# Patient Record
Sex: Female | Born: 1940 | Smoking: Never smoker
Health system: Southern US, Community
[De-identification: ages and names within clinical notes are randomized; demographics above are authoritative.]

## PROBLEM LIST (undated history)

## (undated) DIAGNOSIS — R51 Headache: Secondary | ICD-10-CM

## (undated) DIAGNOSIS — Z5189 Encounter for other specified aftercare: Secondary | ICD-10-CM

## (undated) DIAGNOSIS — H409 Unspecified glaucoma: Secondary | ICD-10-CM

## (undated) DIAGNOSIS — R0982 Postnasal drip: Secondary | ICD-10-CM

## (undated) DIAGNOSIS — K5900 Constipation, unspecified: Secondary | ICD-10-CM

## (undated) DIAGNOSIS — R519 Headache, unspecified: Secondary | ICD-10-CM

## (undated) DIAGNOSIS — I10 Essential (primary) hypertension: Secondary | ICD-10-CM

## (undated) DIAGNOSIS — D649 Anemia, unspecified: Secondary | ICD-10-CM

## (undated) DIAGNOSIS — J189 Pneumonia, unspecified organism: Secondary | ICD-10-CM

## (undated) DIAGNOSIS — H269 Unspecified cataract: Secondary | ICD-10-CM

## (undated) DIAGNOSIS — E785 Hyperlipidemia, unspecified: Secondary | ICD-10-CM

## (undated) DIAGNOSIS — J449 Chronic obstructive pulmonary disease, unspecified: Secondary | ICD-10-CM

## (undated) DIAGNOSIS — M1990 Unspecified osteoarthritis, unspecified site: Secondary | ICD-10-CM

## (undated) HISTORY — PX: TUBAL LIGATION: SHX77

## (undated) HISTORY — PX: ABDOMINAL HYSTERECTOMY: SHX81

## (undated) HISTORY — DX: Unspecified glaucoma: H40.9

## (undated) HISTORY — DX: Hyperlipidemia, unspecified: E78.5

## (undated) HISTORY — DX: Anemia, unspecified: D64.9

## (undated) HISTORY — DX: Chronic obstructive pulmonary disease, unspecified: J44.9

## (undated) HISTORY — PX: TONSILLECTOMY: SUR1361

## (undated) HISTORY — DX: Encounter for other specified aftercare: Z51.89

## (undated) HISTORY — DX: Unspecified cataract: H26.9

## (undated) HISTORY — DX: Essential (primary) hypertension: I10

## (undated) HISTORY — DX: Headache, unspecified: R51.9

## (undated) HISTORY — DX: Constipation, unspecified: K59.00

## (undated) HISTORY — DX: Unspecified osteoarthritis, unspecified site: M19.90

## (undated) HISTORY — DX: Headache: R51

## (undated) HISTORY — DX: Postnasal drip: R09.82

## (undated) HISTORY — DX: Pneumonia, unspecified organism: J18.9

---

## 2016-12-05 ENCOUNTER — Ambulatory Visit (INDEPENDENT_AMBULATORY_CARE_PROVIDER_SITE_OTHER): Payer: Medicare Other | Admitting: Family Medicine

## 2016-12-05 ENCOUNTER — Encounter: Payer: Self-pay | Admitting: Family Medicine

## 2016-12-05 ENCOUNTER — Encounter (INDEPENDENT_AMBULATORY_CARE_PROVIDER_SITE_OTHER): Payer: Self-pay

## 2016-12-05 VITALS — BP 153/81 | HR 90 | Temp 97.7°F | Ht 63.0 in | Wt 124.4 lb

## 2016-12-05 DIAGNOSIS — I10 Essential (primary) hypertension: Secondary | ICD-10-CM

## 2016-12-05 MED ORDER — AMLODIPINE BESYLATE 5 MG PO TABS: 5.0000 mg | ORAL_TABLET | Freq: Every day | ORAL | 3 refills | Status: DC

## 2016-12-05 NOTE — Progress Notes (Signed)
Subjective:  Patient ID: Lynn Powell, female    DOB: 19-Oct-1941  Age: 76 y.o. MRN: [id number]  CC: Elevated BP  HPI Lynn Powell is a 76 y.o. female presents to the clinic today for evaluation of the above. Of note, Patient resides predominantly in [place]. This is where the majority of her medical care is. She is down here with her daughters. She is returning in May. Patient states that all of her preventative health care is done in [place].  Elevated BP  Patient reports that she her blood pressure has been elevated as of late.  She recently saw a local urgent care for sinusitis and her blood pressure was in the 170s systolic.  Patient endorses that she has had high blood pressure in the past which was treated previously. This was years ago.  She reports associated headaches.  No chest pain or shortness of breath.  She attributes her elevated blood pressure during stress.  No known relieving factors.  No other associated symptoms. No other complaints or concerns at this time.  PMH, Surgical Hx, Family Hx, Social History reviewed and updated as below.  Past Medical History:  Diagnosis Date  . Frequent headaches   . Glaucoma   . Hypertension    Past Surgical History:  Procedure Laterality Date  . ABDOMINAL HYSTERECTOMY    . TONSILLECTOMY     Family History  Problem Relation Age of Onset  . Heart disease Mother   . Stroke Father    Social History  Substance Use Topics  . Smoking status: Never Smoker  . Smokeless tobacco: Never Used  . Alcohol use No   Review of Systems  Neurological: Positive for headaches.  Psychiatric/Behavioral:       Stress.  All other systems reviewed and are negative.  Objective:   Today's Vitals: BP (!) 153/81   Pulse 90   Temp 97.7 F (36.5 C) (Oral)   Ht 5\' 3"  (1.6 m)   Wt 124 lb 6.4 oz (56.4 kg)   SpO2 100%   BMI 22.04 kg/m   Physical Exam  Constitutional: She is oriented to person, place, and time. She  appears well-developed and well-nourished. No distress.  HENT:  Head: Normocephalic and atraumatic.  Nose: Nose normal.  Mouth/Throat: Oropharynx is clear and moist. No oropharyngeal exudate.  Normal TM's bilaterally.   Eyes: Conjunctivae are normal. No scleral icterus.  Neck: Neck supple.  Cardiovascular: Normal rate and regular rhythm.   No murmur heard. Pulmonary/Chest: Effort normal and breath sounds normal. She has no wheezes. She has no rales.  Abdominal: Soft. She exhibits no distension. There is no tenderness. There is no rebound and no guarding.  Musculoskeletal: Normal range of motion. She exhibits no edema.  Lymphadenopathy:    She has no cervical adenopathy.  Neurological: She is alert and oriented to person, place, and time.  Skin: Skin is warm and dry. No rash noted.  Psychiatric: She has a normal mood and affect.  Vitals reviewed.  Assessment & Plan:   Problem List Items Addressed This Visit    Essential hypertension - Primary    New problem (to me). Treating with Norvasc. Follow up in 1 month for BP check.      Relevant Medications   amLODipine (NORVASC) 5 MG tablet     Outpatient Encounter Prescriptions as of 12/05/2016  Medication Sig  . progesterone (PROMETRIUM) 200 MG capsule TAKE ONE CAPSULE BY MOUTH DAILY AT BEDTIME  . amLODipine (NORVASC) 5 MG tablet Take 1  tablet (5 mg total) by mouth daily.   No facility-administered encounter medications on file as of 12/05/2016.     Follow-up: Return in about 1 month (around 01/02/2017) for BP check - Nurse visit.  Everlene Other DO [facility]

## 2016-12-05 NOTE — Patient Instructions (Signed)
Take the blood pressure medication daily.  You will need labs in the near future if you stay in [place].  Follow up in 1 month for a BP check.  Take care  Dr. Adriana Simasook

## 2016-12-05 NOTE — Progress Notes (Signed)
Pre visit review using our clinic review tool, if applicable. No additional management support is needed unless otherwise documented below in the visit note. 

## 2016-12-05 NOTE — Assessment & Plan Note (Signed)
New problem (to me). Treating with Norvasc. Follow up in 1 month for BP check.

## 2017-01-07 ENCOUNTER — Ambulatory Visit (INDEPENDENT_AMBULATORY_CARE_PROVIDER_SITE_OTHER): Payer: Medicare Other | Admitting: *Deleted

## 2017-01-07 ENCOUNTER — Encounter: Payer: Self-pay | Admitting: *Deleted

## 2017-01-07 VITALS — BP 124/64 | HR 88 | Resp 10

## 2017-01-07 DIAGNOSIS — I10 Essential (primary) hypertension: Secondary | ICD-10-CM | POA: Diagnosis not present

## 2017-01-07 NOTE — Progress Notes (Signed)
BP at goal.  Continue medication.  Care was provided under my supervision. I agree with the management as indicated in the note.  Everlene OtherJayce Amareon Phung DO

## 2017-01-07 NOTE — Progress Notes (Signed)
Patient presented for one month BP check, patient was started on Amlodipine 5 mg by PCP on 12/05/2016 for BP 153/90 pulse 90. Patient was allowed to rest for 5 -8 minutes BP attained in left arm 120/64 pulse 88, patient was allowed to rest an additional 5- 8 minutes BP attained in right arm 120/64 pulse 87.

## 2017-01-07 NOTE — Progress Notes (Signed)
Patient notified to continue Amlodipine.

## 2017-07-01 ENCOUNTER — Encounter: Payer: Self-pay | Admitting: Family Medicine

## 2017-07-01 ENCOUNTER — Ambulatory Visit (INDEPENDENT_AMBULATORY_CARE_PROVIDER_SITE_OTHER): Payer: Medicare Other | Admitting: Family Medicine

## 2017-07-01 DIAGNOSIS — L989 Disorder of the skin and subcutaneous tissue, unspecified: Secondary | ICD-10-CM | POA: Diagnosis not present

## 2017-07-01 NOTE — Patient Instructions (Signed)
Dermatologist: Dr. Gwen [facility]oundsKowalski [facility]).  I highly doubt you're diabetic.  Take care  Dr. Adriana Simasook

## 2017-07-01 NOTE — Assessment & Plan Note (Signed)
New problem. Uncertain prognosis at this time. Concern for actinic keratosis versus squamous cell carcinoma. I favor the former. Patient has had lots of sun exposure and has had skin lesions removed in the past. I informed her that this is not a diabetic wound and then I sincerely doubt she has diabetes. She states that she's had labs by a physician in [place] approximately one year ago and they were normal (normal A1C). I advised that she should see dermatology. I gave her the name of a local dermatologist. She will look him up and consider a referral.

## 2017-07-01 NOTE — Progress Notes (Signed)
   Subjective:  Patient ID: Lynn Powell, female    DOB: 12/13/1940  Age: 76 y.o. MRN: [id number]  CC: Skin lesion  HPI:  76 year old female presents with the above complaint.  Patient reports that she's had a sore/skin lesion on her posterior left calf the past 6 months. She states that it's dry and irritated. It subsequently scabs over and then the scab falls off. She states that it has not completely healed over the past 6 months. She states that she has made some dietary changes and feels like she's had some improvement. She has used some hydrocortisone with some improvement. No known exacerbating factors. She is concerned that she has diabetes and this is what is leading to poor wound healing. No drainage from the wound. No surrounding erythema. No reports of fluctuance. No reports of color change or increase in size. No other associated symptoms. No other complaints or concerns at this time.  Social Hx   Social History   Social History  . Marital status: Widowed    Spouse name: N/A  . Number of children: N/A  . Years of education: N/A   Social History Main Topics  . Smoking status: Never Smoker  . Smokeless tobacco: Never Used  . Alcohol use No  . Drug use: No  . Sexual activity: No   Other Topics Concern  . None   Social History Narrative  . None    Review of Systems  Constitutional: Negative.   Skin:       Skin lesion.   Objective:  BP 110/64 (BP Location: Left Arm, Patient Position: Sitting, Cuff Size: Normal)   Pulse 84   Temp 98.7 F (37.1 C) (Oral)   Resp 16   Wt 122 lb (55.3 kg)   SpO2 97%   BMI 21.61 kg/m   BP/Weight 07/01/2017 01/07/2017 12/05/2016  Systolic BP 110 124 153  Diastolic BP 64 64 81  Wt. (Lbs) 122 - 124.4  BMI 21.61 - 22.04    Physical Exam  Constitutional: She is oriented to person, place, and time. She appears well-developed. No distress.  HENT:  Head: Normocephalic and atraumatic.  Pulmonary/Chest: Effort normal. No respiratory  distress.  Neurological: She is alert and oriented to person, place, and time.  Skin:  Left calf - small area of mild erythema and dryness. No drainage. No fluctuance.  Psychiatric: She has a normal mood and affect.  Vitals reviewed.  Assessment & Plan:   Problem List Items Addressed This Visit    Skin lesion    New problem. Uncertain prognosis at this time. Concern for actinic keratosis versus squamous cell carcinoma. I favor the former. Patient has had lots of sun exposure and has had skin lesions removed in the past. I informed her that this is not a diabetic wound and then I sincerely doubt she has diabetes. She states that she's had labs by a physician in [place] approximately one year ago and they were normal (normal A1C). I advised that she should see dermatology. I gave her the name of a local dermatologist. She will look him up and consider a referral.        Follow-up: PRN  Everlene OtherJayce Alka Falwell DO [facility]

## 2017-10-22 ENCOUNTER — Ambulatory Visit (INDEPENDENT_AMBULATORY_CARE_PROVIDER_SITE_OTHER): Payer: Medicare Other | Admitting: Family Medicine

## 2017-10-22 ENCOUNTER — Encounter: Payer: Self-pay | Admitting: Family Medicine

## 2017-10-22 VITALS — BP 116/70 | HR 73 | Temp 97.9°F | Wt 121.0 lb

## 2017-10-22 DIAGNOSIS — J309 Allergic rhinitis, unspecified: Secondary | ICD-10-CM

## 2017-10-22 NOTE — Patient Instructions (Signed)
Please try some ceterizine (generic Zyrtec). Take in the evening for 5-7 days, then daily as needed.

## 2017-10-22 NOTE — Progress Notes (Signed)
   Subjective:    Patient ID: Carolynn CommentCheryl Laver, female    DOB: 1941/07/30, 76 y.o.   MRN: [id number]  HPI This is a 76 yo female who presents today with clear nasal drainage x 2 months. Started with a cold. Seemed to last for a long time and head congestion has lingered. No color to drainage and no sinus pain. Taking tylenol PM to help with sleep, some relief, but makes her groggy. A lot of post nasal drainage. No cough or chest congestion. Occasional headache, relieved with aspirin or tylenol or sleep. Has been serum tested for allergies in past and had tomato plant allergy. Other allergies unknown. Does have cats in the house.   Past Medical History:  Diagnosis Date  . Frequent headaches   . Glaucoma   . Hypertension    Past Surgical History:  Procedure Laterality Date  . ABDOMINAL HYSTERECTOMY    . TONSILLECTOMY     Family History  Problem Relation Age of Onset  . Heart disease Mother   . Stroke Father    Social History   Tobacco Use  . Smoking status: Never Smoker  . Smokeless tobacco: Never Used  Substance Use Topics  . Alcohol use: No  . Drug use: No    .   Review of Systems Per HPI    Objective:   Physical Exam  Constitutional: She is oriented to person, place, and time. She appears well-developed and well-nourished. No distress.  HENT:  Head: Normocephalic and atraumatic.  Right Ear: Tympanic membrane, external ear and ear canal normal.  Left Ear: Tympanic membrane, external ear and ear canal normal.  Nose: Mucosal edema and rhinorrhea present.  Mouth/Throat: Uvula is midline, oropharynx is clear and moist and mucous membranes are normal.  Eyes: Conjunctivae are normal.  Neck: Normal range of motion. Neck supple.  Cardiovascular: Normal rate, regular rhythm and normal heart sounds.  Pulmonary/Chest: Effort normal and breath sounds normal.  Lymphadenopathy:    She has no cervical adenopathy.  Neurological: She is alert and oriented to person, place, and  time.  Skin: Skin is warm and dry. She is not diaphoretic.  Psychiatric: She has a normal mood and affect. Her behavior is normal. Judgment and thought content normal.  Vitals reviewed.     BP 116/70   Pulse 73   Temp 97.9 F (36.6 C) (Oral)   Wt 121 lb (54.9 kg)   SpO2 99%   BMI 21.43 kg/m  Wt Readings from Last 3 Encounters:  10/22/17 121 lb (54.9 kg)  07/01/17 122 lb (55.3 kg)  12/05/16 124 lb 6.4 oz (56.4 kg)       Assessment & Plan:  1. Allergic rhinitis, unspecified seasonality, unspecified trigger - Provided written and verbal information regarding diagnosis and treatment. - Encouraged her to try otc cetirizine - RTC precautions reviewed   Olean Reeeborah Gessner, FNP-BC  [facility], [facility]  10/22/2017 1:12 PM

## 2017-11-15 ENCOUNTER — Other Ambulatory Visit: Payer: Self-pay | Admitting: Family Medicine

## 2017-12-01 ENCOUNTER — Ambulatory Visit (INDEPENDENT_AMBULATORY_CARE_PROVIDER_SITE_OTHER): Payer: Medicare Other | Admitting: Internal Medicine

## 2017-12-01 ENCOUNTER — Encounter: Payer: Self-pay | Admitting: Internal Medicine

## 2017-12-01 VITALS — BP 102/58 | HR 103 | Temp 98.9°F | Ht 63.0 in | Wt 121.8 lb

## 2017-12-01 DIAGNOSIS — I10 Essential (primary) hypertension: Secondary | ICD-10-CM | POA: Diagnosis not present

## 2017-12-01 DIAGNOSIS — Z1329 Encounter for screening for other suspected endocrine disorder: Secondary | ICD-10-CM | POA: Diagnosis not present

## 2017-12-01 DIAGNOSIS — J189 Pneumonia, unspecified organism: Secondary | ICD-10-CM | POA: Diagnosis not present

## 2017-12-01 MED ORDER — DOXYCYCLINE HYCLATE 100 MG PO TABS: 100.0000 mg | ORAL_TABLET | Freq: Two times a day (BID) | ORAL | 0 refills | Status: DC

## 2017-12-01 MED ORDER — AMLODIPINE BESYLATE 5 MG PO TABS: 5.0000 mg | ORAL_TABLET | Freq: Every day | ORAL | 1 refills | Status: DC

## 2017-12-01 NOTE — Progress Notes (Signed)
Chief Complaint  Patient presents with  . Follow-up    PNA  . Hypertension   Follow up  1. Pt reports [facility] with [facility] and COPD but never smoker but exposed to 2nd hand smoke and lived in [facility] [place] with pollution. She reports recent sick contact with grandkids +flu. She was given Zpack which she did not fill then told by [facility] to fill Augmentin which she has not yet filled. Other than slight fatigue feeling ok and she did have a fever to 103.2 and has been coughing. She was also given tessalon perles which she has not filled yet.  2. HTN she needs refill of Norvasc 5 mg qd and reports she is going to [place] Friday x 1 month and will be back early 12/2017.    Review of Systems  Constitutional: Positive for fever and malaise/fatigue. Negative for weight loss.  HENT: Negative for hearing loss.   Respiratory: Positive for cough. Negative for shortness of breath.   Cardiovascular: Negative for chest pain.  Gastrointestinal: Negative for abdominal pain.  Musculoskeletal: Negative for falls.  Skin: Negative for rash.  Neurological: Negative for headaches.  Psychiatric/Behavioral: Negative for memory loss.   Past Medical History:  Diagnosis Date  . Frequent headaches   . Glaucoma   . Hypertension   . Pneumonia    Past Surgical History:  Procedure Laterality Date  . ABDOMINAL HYSTERECTOMY    . TONSILLECTOMY     Family History  Problem Relation Age of Onset  . Heart disease Mother   . Stroke Father    Social History   Socioeconomic History  . Marital status: Widowed    Spouse name: Not on file  . Number of children: Not on file  . Years of education: Not on file  . Highest education level: Not on file  Social Needs  . Financial resource strain: Not on file  . Food insecurity - worry: Not on file  . Food insecurity - inability: Not on file  . Transportation needs - medical: Not on file  . Transportation needs - non-medical: Not on  file  Occupational History  . Not on file  Tobacco Use  . Smoking status: Never Smoker  . Smokeless tobacco: Never Used  Substance and Sexual Activity  . Alcohol use: No  . Drug use: No  . Sexual activity: No    Partners: Male  Other Topics Concern  . Not on file  Social History Narrative  . Not on file   Current Meds  Medication Sig  . amLODipine (NORVASC) 5 MG tablet Take 1 tablet (5 mg total) by mouth daily.  . benzonatate (TESSALON) 100 MG capsule Take by mouth 3 (three) times daily as needed for cough.  . estradiol (ESTRACE) 0.1 MG/GM vaginal cream Place 1 Applicatorful vaginally at bedtime.  . progesterone (PROMETRIUM) 200 MG capsule TAKE ONE CAPSULE BY MOUTH DAILY AT BEDTIME  . [DISCONTINUED] amLODipine (NORVASC) 5 MG tablet Take 1 tablet (5 mg total) by mouth daily.   No Known Allergies No results found for this or any previous visit (from the past 2160 hour(s)). Objective  Body mass index is 21.58 kg/m. Wt Readings from Last 3 Encounters:  12/01/17 121 lb 12.8 oz (55.2 kg)  10/22/17 121 lb (54.9 kg)  07/01/17 122 lb (55.3 kg)   Temp Readings from Last 3 Encounters:  12/01/17 98.9 F (37.2 C) (Oral)  10/22/17 97.9 F (36.6 C) (Oral)  07/01/17 98.7 F (37.1  C) (Oral)   BP Readings from Last 3 Encounters:  12/01/17 (!) 102/58  10/22/17 116/70  07/01/17 110/64   Pulse Readings from Last 3 Encounters:  12/01/17 (!) 103  10/22/17 73  07/01/17 84   O2 sat room air 94%  Physical Exam  Constitutional: She is oriented to person, place, and time and well-developed, well-nourished, and in no distress.  HENT:  Head: Normocephalic and atraumatic.  Mouth/Throat: Oropharynx is clear and moist and mucous membranes are normal.  Eyes: Conjunctivae are normal. Pupils are equal, round, and reactive to light.  Cardiovascular: Regular rhythm and normal heart sounds. Tachycardia present.  Pulmonary/Chest: Effort normal and breath sounds normal.  Abdominal: Soft. Bowel  sounds are normal.  Neurological: She is alert and oriented to person, place, and time. Gait normal. Gait normal.  Skin: Skin is warm, dry and intact.  Psychiatric: Mood, memory, affect and judgment normal.  Nursing note and vitals reviewed.   Assessment   1. Pneumonia  2. HTN  3. HM   Plan  1. Rx Doxy 100 mg bid x 1 week with food (do not pick up Augmentin/Zpack for now), Tessalon perles, Mucinex/Robitussin DM  Get copy of CXR [facility] ordered repeat CXR 12/29/17  2. Refilled Norvasc 5 mg  Will need to check labs at f/u CMET, CBC, lipid, UA, TSH 3.  Unknown vaccine history  Consider flu, Tdap, prevnar, pna 23, shingrix at f/u   Will need mammogram  Out of age window pap  Colonoscopy unknown if had  Consider DEXA in future if has not had    CXR 12/29/17 repeat  Provider: Dr. French Anaracy McLean-[facility]oc[facility]

## 2017-12-01 NOTE — Progress Notes (Signed)
Pre visit review using our clinic review tool, if applicable. No additional management support is needed unless otherwise documented below in the visit note. 

## 2017-12-01 NOTE — Patient Instructions (Addendum)
F/u early 12/2017 am appt for labs and CXR  Please pick up Doxycycline 100 mg 2x per day do not pick up Zpack or Augmentin  Try Robitussin DM/Mucinex DM for cough with Tessalon perles    Community-Acquired Pneumonia, Adult Pneumonia is an infection of the lungs. There are different types of pneumonia. One type can develop while a person is in a hospital. A different type, called community-acquired pneumonia, develops in people who are not, or have not recently been, in the hospital or other health care facility. What are the causes? Pneumonia may be caused by bacteria, viruses, or funguses. Community-acquired pneumonia is often caused by Streptococcus pneumonia bacteria. These bacteria are often passed from one person to another by breathing in droplets from the cough or sneeze of an infected person. What increases the risk? The condition is more likely to develop in:  People who havechronic diseases, such as chronic obstructive pulmonary disease (COPD), asthma, congestive heart failure, cystic fibrosis, diabetes, or kidney disease.  People who haveearly-stage or late-stage HIV.  People who havesickle cell disease.  People who havehad their spleen removed (splenectomy).  People who havepoor Administrator.  People who havemedical conditions that increase the risk of breathing in (aspirating) secretions their own mouth and nose.  People who havea weakened immune system (immunocompromised).  People who smoke.  People whotravel to areas where pneumonia-causing germs commonly exist.  People whoare around animal habitats or animals that have pneumonia-causing germs, including birds, bats, rabbits, cats, and farm animals.  What are the signs or symptoms? Symptoms of this condition include:  Adry cough.  A wet (productive) cough.  Fever.  Sweating.  Chest pain, especially when breathing deeply or coughing.  Rapid breathing or difficulty breathing.  Shortness of  breath.  Shaking chills.  Fatigue.  Muscle aches.  How is this diagnosed? Your health care provider will take a medical history and perform a physical exam. You may also have other tests, including:  Imaging studies of your chest, including X-rays.  Tests to check your blood oxygen level and other blood gases.  Other tests on blood, mucus (sputum), fluid around your lungs (pleural fluid), and urine.  If your pneumonia is severe, other tests may be done to identify the specific cause of your illness. How is this treated? The type of treatment that you receive depends on many factors, such as the cause of your pneumonia, the medicines you take, and other medical conditions that you have. For most adults, treatment and recovery from pneumonia may occur at home. In some cases, treatment must happen in a hospital. Treatment may include:  Antibiotic medicines, if the pneumonia was caused by bacteria.  Antiviral medicines, if the pneumonia was caused by a virus.  Medicines that are given by mouth or through an IV tube.  Oxygen.  Respiratory therapy.  Although rare, treating severe pneumonia may include:  Mechanical ventilation. This is done if you are not breathing well on your own and you cannot maintain a safe blood oxygen level.  Thoracentesis. This procedureremoves fluid around one lung or both lungs to help you breathe better.  Follow these instructions at home:  Take over-the-counter and prescription medicines only as told by your health care provider. ? Only takecough medicine if you are losing sleep. Understand that cough medicine can prevent your body's natural ability to remove mucus from your lungs. ? If you were prescribed an antibiotic medicine, take it as told by your health care provider. Do not stop taking the  antibiotic even if you start to feel better.  Sleep in a semi-upright position at night. Try sleeping in a reclining chair, or place a few pillows under  your head.  Do not use tobacco products, including cigarettes, chewing tobacco, and e-cigarettes. If you need help quitting, ask your health care provider.  Drink enough water to keep your urine clear or pale yellow. This will help to thin out mucus secretions in your lungs. How is this prevented? There are ways that you can decrease your risk of developing community-acquired pneumonia. Consider getting a pneumococcal vaccine if:  You are older than 65 years of age76.  You are older than 76 years of age and are undergoing cancer treatment, have chronic lung disease, or have other medical conditions that affect your immune system. Ask your health care provider if this applies to you.  There are different types and schedules of pneumococcal vaccines. Ask your health care provider which vaccination option is best for you. You may also prevent community-acquired pneumonia if you take these actions:  Get an influenza vaccine every year. Ask your health care provider which type of influenza vaccine is best for you.  Go to the dentist on a regular basis.  Wash your hands often. Use hand sanitizer if soap and water are not available.  Contact a health care provider if:  You have a fever.  You are losing sleep because you cannot control your cough with cough medicine. Get help right away if:  You have worsening shortness of breath.  You have increased chest pain.  Your sickness becomes worse, especially if you are an older adult or have a weakened immune system.  You cough up blood. This information is not intended to replace advice given to you by your health care provider. Make sure you discuss any questions you have with your health care provider. Document Released: 10/14/2005 Document Revised: 02/22/2016 Document Reviewed: 02/08/2015 Elsevier Interactive Patient Education  [employer]2018 [employer] Inc[employer]

## 2017-12-03 ENCOUNTER — Encounter: Payer: Self-pay | Admitting: Internal Medicine

## 2017-12-03 DIAGNOSIS — J189 Pneumonia, unspecified organism: Secondary | ICD-10-CM | POA: Insufficient documentation

## 2017-12-08 ENCOUNTER — Telehealth: Payer: Self-pay | Admitting: Internal Medicine

## 2017-12-08 NOTE — Telephone Encounter (Unsigned)
Copied from CRM [id number]. Topic: Inquiry >> Dec 08, 2017 12:17 PM Raquel SarnaHayes, Teresa G wrote: Pt was diagnossed w/ pheumonia.  Dr. Shirlee LatchMcLean said pt can walk in to have more Xrays done when pt comes back into town on 12/29/17.  Pt will not be able to come in until 12/30/17 Tues. Pt wants to know if this will be ok to do.  Please call pt to discuss.

## 2017-12-09 ENCOUNTER — Other Ambulatory Visit: Payer: Self-pay | Admitting: Internal Medicine

## 2017-12-09 NOTE — Telephone Encounter (Signed)
Pt notified that there is no appt needed. So she can come any day between 9am-4pm

## 2017-12-09 NOTE — Progress Notes (Signed)
[facility] records 11/30/17 CXR +possible COPD, retrocardiac infiltrate  TMS

## 2017-12-30 ENCOUNTER — Ambulatory Visit (INDEPENDENT_AMBULATORY_CARE_PROVIDER_SITE_OTHER): Payer: Medicare Other

## 2017-12-30 DIAGNOSIS — J189 Pneumonia, unspecified organism: Secondary | ICD-10-CM

## 2018-01-01 ENCOUNTER — Ambulatory Visit: Payer: Medicare Other | Admitting: Internal Medicine

## 2018-01-01 ENCOUNTER — Ambulatory Visit: Payer: Medicare Other | Admitting: Family Medicine

## 2018-01-02 ENCOUNTER — Encounter: Payer: Self-pay | Admitting: Internal Medicine

## 2018-01-02 ENCOUNTER — Ambulatory Visit (INDEPENDENT_AMBULATORY_CARE_PROVIDER_SITE_OTHER): Payer: Medicare Other | Admitting: Internal Medicine

## 2018-01-02 VITALS — BP 140/84 | HR 98 | Temp 98.1°F | Ht 63.0 in | Wt 115.4 lb

## 2018-01-02 DIAGNOSIS — J309 Allergic rhinitis, unspecified: Secondary | ICD-10-CM

## 2018-01-02 DIAGNOSIS — I10 Essential (primary) hypertension: Secondary | ICD-10-CM | POA: Diagnosis not present

## 2018-01-02 DIAGNOSIS — E559 Vitamin D deficiency, unspecified: Secondary | ICD-10-CM | POA: Diagnosis not present

## 2018-01-02 DIAGNOSIS — R5383 Other fatigue: Secondary | ICD-10-CM | POA: Insufficient documentation

## 2018-01-02 DIAGNOSIS — R0982 Postnasal drip: Secondary | ICD-10-CM | POA: Diagnosis not present

## 2018-01-02 DIAGNOSIS — Z13220 Encounter for screening for lipoid disorders: Secondary | ICD-10-CM | POA: Diagnosis not present

## 2018-01-02 MED ORDER — AZELASTINE HCL 0.15 % NA SOLN: 1.0000 | Freq: Every day | NASAL | 2 refills | Status: DC | PRN

## 2018-01-02 NOTE — Patient Instructions (Addendum)
Bring records from [place] (vaccines and bone density)  Think about Singulair pill for allergies  Try Nasal saline for your nose  Try Flonase over the counter max # sprays 2 total per day  Try Azestaline nasal spray nasal antihistamine  Think about prevnar and getting on the nurse schedule for prevnar  Schedule fasting labs next week and f/u in 1 month sooner if needed  Consider mammogram    Pneumococcal Conjugate Vaccine suspension for injection What is this medicine? PNEUMOCOCCAL VACCINE (NEU mo KOK al vak SEEN) is a vaccine used to prevent pneumococcus bacterial infections. These bacteria can cause serious infections like pneumonia, meningitis, and blood infections. This vaccine will lower your chance of getting pneumonia. If you do get pneumonia, it can make your symptoms milder and your illness shorter. This vaccine will not treat an infection and will not cause infection. This vaccine is recommended for infants and young children, adults with certain medical conditions, and adults 65 years or older. This medicine may be used for other purposes; ask your health care provider or pharmacist if you have questions. COMMON BRAND NAME(S): Prevnar, Prevnar 13 What should I tell my health care provider before I take this medicine? They need to know if you have any of these conditions: -bleeding problems -fever -immune system problems -an unusual or allergic reaction to pneumococcal vaccine, diphtheria toxoid, other vaccines, latex, other medicines, foods, dyes, or preservatives -pregnant or trying to get pregnant -breast-feeding How should I use this medicine? This vaccine is for injection into a muscle. It is given by a health care professional. A copy of Vaccine Information Statements will be given before each vaccination. Read this sheet carefully each time. The sheet may change frequently. Talk to your pediatrician regarding the use of this medicine in children. While this drug may be  prescribed for children as young as 39 weeks old for selected conditions, precautions do apply. Overdosage: If you think you have taken too much of this medicine contact a poison control center or emergency room at once. NOTE: This medicine is only for you. Do not share this medicine with others. What if I miss a dose? It is important not to miss your dose. Call your doctor or health care professional if you are unable to keep an appointment. What may interact with this medicine? -medicines for cancer chemotherapy -medicines that suppress your immune function -steroid medicines like prednisone or cortisone This list may not describe all possible interactions. Give your health care provider a list of all the medicines, herbs, non-prescription drugs, or dietary supplements you use. Also tell them if you smoke, drink alcohol, or use illegal drugs. Some items may interact with your medicine. What should I watch for while using this medicine? Mild fever and pain should go away in 3 days or less. Report any unusual symptoms to your doctor or health care professional. What side effects may I notice from receiving this medicine? Side effects that you should report to your doctor or health care professional as soon as possible: -allergic reactions like skin rash, itching or hives, swelling of the face, lips, or tongue -breathing problems -confused -fast or irregular heartbeat -fever over 102 degrees F -seizures -unusual bleeding or bruising -unusual muscle weakness Side effects that usually do not require medical attention (report to your doctor or health care professional if they continue or are bothersome): -aches and pains -diarrhea -fever of 102 degrees F or less -headache -irritable -loss of appetite -pain, tender at site where injected -  trouble sleeping This list may not describe all possible side effects. Call your doctor for medical advice about side effects. You may report side effects  to FDA at 1-800-FDA-1088. Where should I keep my medicine? This does not apply. This vaccine is given in a clinic, pharmacy, doctor's office, or other health care setting and will not be stored at home. NOTE: This sheet is a summary. It may not cover all possible information. If you have questions about this medicine, talk to your doctor, pharmacist, or health care provider.  2018 Elsevier/Gold Standard (2014-07-21 10:27:27)  Montelukast oral tablets What is this medicine? MONTELUKAST (mon te LOO kast) is used to prevent and treat the symptoms of asthma. It is also used to treat allergies. Do not use for an acute asthma attack. This medicine may be used for other purposes; ask your health care provider or pharmacist if you have questions. COMMON BRAND NAME(S): Singulair What should I tell my health care provider before I take this medicine? They need to know if you have any of these conditions: -liver disease -an unusual or allergic reaction to montelukast, other medicines, foods, dyes, or preservatives -pregnant or trying to get pregnant -breast-feeding How should I use this medicine? This medicine should be given by mouth. Follow the directions on the prescription label. Take this medicine at the same time every day. You may take this medicine with or without meals. Do not chew the tablets. Do not stop taking your medicine unless your doctor tells you to. Talk to your pediatrician regarding the use of this medicine in children. Special care may be needed. While this drug may be prescribed for children as young as 31 years of age for selected conditions, precautions do apply. Overdosage: If you think you have taken too much of this medicine contact a poison control center or emergency room at once. NOTE: This medicine is only for you. Do not share this medicine with others. What if I miss a dose? If you miss a dose, take it as soon as you can. If it is almost time for your next dose, take  only that dose. Do not take double or extra doses. What may interact with this medicine? -anti-infectives like rifampin and rifabutin -medicines for diabetes like rosiglitazone and repaglinide -medicines for seizures like phenytoin, phenobarbital, and carbamazepine -paclitaxel This list may not describe all possible interactions. Give your health care provider a list of all the medicines, herbs, non-prescription drugs, or dietary supplements you use. Also tell them if you smoke, drink alcohol, or use illegal drugs. Some items may interact with your medicine. What should I watch for while using this medicine? Visit your doctor or health care professional for regular checks on your progress. Tell your doctor or health care professional if your allergy or asthma symptoms do not improve. Take your medicine even when you do not have symptoms. Do not stop taking any of your medicine(s) unless your doctor tells you to. If you have asthma, talk to your doctor about what to do in an acute asthma attack. Always have your inhaled rescue medicine for asthma attacks with you. Patients and their families should watch for new or worsening thoughts of suicide or depression. Also watch for sudden changes in feelings such as feeling anxious, agitated, panicky, irritable, hostile, aggressive, impulsive, severely restless, overly excited and hyperactive, or not being able to sleep. Any worsening of mood or thoughts of suicide or dying should be reported to your health care professional right away. What  side effects may I notice from receiving this medicine? Side effects that you should report to your doctor or health care professional as soon as possible: -allergic reactions like skin rash or hives, or swelling of the face, lips, or tongue -breathing problems -confusion -dark urine -fever or infection -flu-like symptoms -hallucinations -painful lumps under the skin -pain, tingling, numbness in the hands or  feet -sinus pain or swelling -suicidal thoughts or other mood changes -tremors -trouble sleeping -uncontrolled muscle movements -unusual bleeding or bruising -yellowing of the eyes or skin Side effects that usually do not require medical attention (report to your doctor or health care professional if they continue or are bothersome): -cough -dizziness -drowsiness -headache -nightmares -stomach upset -stuffy nose This list may not describe all possible side effects. Call your doctor for medical advice about side effects. You may report side effects to FDA at 1-800-FDA-1088. Where should I keep my medicine? Keep out of the reach of children. Store at room temperature between 15 and 30 degrees C (59 and 86 degrees F). Protect from light and moisture. Keep this medicine in the original bottle. Throw away any unused medicine after the expiration date. NOTE: This sheet is a summary. It may not cover all possible information. If you have questions about this medicine, talk to your doctor, pharmacist, or health care provider.  2018 Elsevier/Gold Standard (2015-10-16 09:40:44)

## 2018-01-02 NOTE — Progress Notes (Signed)
Chief Complaint  Patient presents with  . Follow-up   Follow up  1. Pneumonia better CXR 12/30/17 neg  2. Still c/o allergies and c/w allergy to mold since moved to [place]. She has a holistic MD in [place] who did a lot of tests on her and stated she was allergic to mold on tomatoe plants in the yard. She c/o postnasal drip. She does take zyrtec qhs. She c/o eyes runny and nasal congestion not using any nasal sprays now.   3. C/o fatigue esp since dx'ed pna    Review of Systems  Constitutional: Positive for malaise/fatigue and weight loss.       6 lbs  HENT: Negative for hearing loss.        +post nasal drip  Eyes: Negative for blurred vision.  Respiratory: Negative for shortness of breath.   Cardiovascular: Negative for chest pain.  Gastrointestinal:       +reduced appetite   Musculoskeletal: Negative for falls.  Skin: Negative for rash.  Neurological: Negative for headaches.  Endo/Heme/Allergies: Positive for environmental allergies.  Psychiatric/Behavioral: Negative for memory loss.   Past Medical History:  Diagnosis Date  . Anemia    kid  . COPD (chronic obstructive pulmonary disease) (HCC)   . Frequent headaches   . Glaucoma   . Hypertension   . Pneumonia    11/2017    Past Surgical History:  Procedure Laterality Date  . ABDOMINAL HYSTERECTOMY    . TONSILLECTOMY     Family History  Problem Relation Age of Onset  . Heart disease Mother   . Stroke Father    Social History   Socioeconomic History  . Marital status: Widowed    Spouse name: Not on file  . Number of children: Not on file  . Years of education: Not on file  . Highest education level: Not on file  Social Needs  . Financial resource strain: Not on file  . Food insecurity - worry: Not on file  . Food insecurity - inability: Not on file  . Transportation needs - medical: Not on file  . Transportation needs - non-medical: Not on file  Occupational History  . Not on file  Tobacco Use  . Smoking  status: Never Smoker  . Smokeless tobacco: Never Used  Substance and Sexual Activity  . Alcohol use: No  . Drug use: No  . Sexual activity: No    Partners: Male  Other Topics Concern  . 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 amLODipine (NORVASC) 5 MG tablet Take 1 tablet (5 mg total) by mouth daily.  Marland Kitchen estradiol (ESTRACE) 0.1 MG/GM vaginal cream Place 1 Applicatorful vaginally at bedtime.  . progesterone (PROMETRIUM) 200 MG capsule TAKE ONE CAPSULE BY MOUTH DAILY AT BEDTIME   No Known Allergies No results found for this or any previous visit (from the past 2160 hour(s)). Objective  Body mass index is 20.44 kg/m. Wt Readings from Last 3 Encounters:  01/02/18 115 lb 6.4 oz (52.3 kg)  12/01/17 121 lb 12.8 oz (55.2 kg)  10/22/17 121 lb (54.9 kg)   Temp Readings from Last 3 Encounters:  01/02/18 98.1 F (36.7 C) (Oral)  12/01/17 98.9 F (37.2 C) (Oral)  10/22/17 97.9 F (36.6 C) (Oral)   BP Readings from Last 3 Encounters:  01/02/18 140/84  12/01/17 (!) 102/58  10/22/17 116/70   Pulse Readings from Last 3 Encounters:  01/02/18 98  12/01/17 (!) 103  10/22/17 73   O2 sat room air 98%  Physical Exam  Constitutional: She is oriented to person, place, and time and well-developed, well-nourished, and in no distress. Vital signs are normal.  HENT:  Head: Normocephalic and atraumatic.  Mouth/Throat: Oropharynx is clear and moist and mucous membranes are normal.  Eyes: Conjunctivae are normal. Pupils are equal, round, and reactive to light.  Cardiovascular: Normal rate, regular rhythm and normal heart sounds.  Pulmonary/Chest: Effort normal. She has wheezes.  Mild wheezing in right lung field   Neurological: She is alert and oriented to person, place, and time. Gait normal. Gait normal.  Skin: Skin is warm, dry and intact.   Psychiatric: Mood, memory, affect and judgment normal.  Nursing note and vitals reviewed.   Assessment   1. Resolved pneumonia per CXR but wheezing on exam never smoker  2. Allergic rhinitis with nasal sx's and PND 3. Fatigue  4. HM 5. HTN controlled  Plan  1.  Consider CT chest in future if wheezing continues never smoker  2.  Zyrtec, disc NS, add flonase, azestaline nasal sprays disc singulair pt does not want to try  3. Cmet, cbc, UA, lipid, TSH, T4, vit D  No pap s/p hysterectomy DUB rec mammo pt wants to wait  Never had colonoscopy disc cologuard pt wants to review at f/u no further details today no FH colon cancer  [place] in [place] Pt has records at home F/u in 1 month   4. Declines flu shot Disc prevnar today pt needs to get in future does not want today, pna 23 had 10 years ago Tdap had ? Date  Disc shingrix at f/u   No pap s/p hysterectomy DUB rec mammo pt wants to wait  Never had colonoscopy disc cologuard pt wants to review at f/u no further details today no FH colon cancer  [place] in [place] Pt has records at home  Check skin at f/u and consider referral to dermatology in future   [facility] At f/u pt states she has records at home will try to bring   5. Cont norvasc 5 mg qd   Provider: Dr. French Anaracy McLean-Scocuzza-[facility] Medicine

## 2018-01-02 NOTE — Progress Notes (Signed)
Pre visit review using our clinic review tool, if applicable. No additional management support is needed unless otherwise documented below in the visit note. 

## 2018-01-07 ENCOUNTER — Other Ambulatory Visit (INDEPENDENT_AMBULATORY_CARE_PROVIDER_SITE_OTHER): Payer: Medicare Other

## 2018-01-07 DIAGNOSIS — E559 Vitamin D deficiency, unspecified: Secondary | ICD-10-CM | POA: Diagnosis not present

## 2018-01-07 DIAGNOSIS — R5383 Other fatigue: Secondary | ICD-10-CM | POA: Diagnosis not present

## 2018-01-07 DIAGNOSIS — Z13220 Encounter for screening for lipoid disorders: Secondary | ICD-10-CM | POA: Diagnosis not present

## 2018-01-07 LAB — CBC WITH DIFFERENTIAL/PLATELET
Basophils Absolute: 0.1 10*3/uL (ref 0.0–0.1)
Basophils Relative: 1.1 % (ref 0.0–3.0)
EOS PCT: 2.7 % (ref 0.0–5.0)
Eosinophils Absolute: 0.1 10*3/uL (ref 0.0–0.7)
HCT: 39.1 % (ref 36.0–46.0)
HEMOGLOBIN: 13 g/dL (ref 12.0–15.0)
LYMPHS PCT: 36 % (ref 12.0–46.0)
Lymphs Abs: 1.7 10*3/uL (ref 0.7–4.0)
MCHC: 33.3 g/dL (ref 30.0–36.0)
MCV: 95.5 fl (ref 78.0–100.0)
MONOS PCT: 9.1 % (ref 3.0–12.0)
Monocytes Absolute: 0.4 10*3/uL (ref 0.1–1.0)
Neutro Abs: 2.5 10*3/uL (ref 1.4–7.7)
Neutrophils Relative %: 51.1 % (ref 43.0–77.0)
Platelets: 272 10*3/uL (ref 150.0–400.0)
RBC: 4.1 Mil/uL (ref 3.87–5.11)
RDW: 15.4 % (ref 11.5–15.5)
WBC: 4.8 10*3/uL (ref 4.0–10.5)

## 2018-01-07 LAB — URINALYSIS, ROUTINE W REFLEX MICROSCOPIC
BILIRUBIN URINE: NEGATIVE
HGB URINE DIPSTICK: NEGATIVE
KETONES UR: NEGATIVE
LEUKOCYTES UA: NEGATIVE
Nitrite: NEGATIVE
RBC / HPF: NONE SEEN (ref 0–?)
Specific Gravity, Urine: 1.01 (ref 1.000–1.030)
Total Protein, Urine: NEGATIVE
UROBILINOGEN UA: 0.2 (ref 0.0–1.0)
Urine Glucose: NEGATIVE
pH: 6 (ref 5.0–8.0)

## 2018-01-07 LAB — COMPREHENSIVE METABOLIC PANEL
ALBUMIN: 4.1 g/dL (ref 3.5–5.2)
ALK PHOS: 64 U/L (ref 39–117)
ALT: 16 U/L (ref 0–35)
AST: 19 U/L (ref 0–37)
BILIRUBIN TOTAL: 0.5 mg/dL (ref 0.2–1.2)
BUN: 17 mg/dL (ref 6–23)
CO2: 28 mEq/L (ref 19–32)
Calcium: 9.8 mg/dL (ref 8.4–10.5)
Chloride: 103 mEq/L (ref 96–112)
Creatinine, Ser: 0.98 mg/dL (ref 0.40–1.20)
GFR: 58.64 mL/min — AB (ref >60.00)
Glucose, Bld: 91 mg/dL (ref 70–99)
POTASSIUM: 3.9 meq/L (ref 3.5–5.1)
Sodium: 139 mEq/L (ref 135–145)
TOTAL PROTEIN: 7.3 g/dL (ref 6.0–8.3)

## 2018-01-07 LAB — LIPID PANEL
Cholesterol: 183 mg/dL (ref 0–200)
HDL: 58.1 mg/dL (ref >39.00)
LDL Cholesterol: 110 mg/dL — ABNORMAL HIGH (ref 0–99)
NONHDL: 124.6
Total CHOL/HDL Ratio: 3
Triglycerides: 72 mg/dL (ref 0.0–149.0)
VLDL: 14.4 mg/dL (ref 0.0–40.0)

## 2018-01-07 LAB — VITAMIN D 25 HYDROXY (VIT D DEFICIENCY, FRACTURES): VITD: 78.8 ng/mL (ref 30.00–100.00)

## 2018-01-07 LAB — T4, FREE: FREE T4: 0.78 ng/dL (ref 0.60–1.60)

## 2018-01-07 LAB — TSH: TSH: 1.65 u[IU]/mL (ref 0.35–4.50)

## 2018-01-27 ENCOUNTER — Other Ambulatory Visit: Payer: Self-pay | Admitting: Internal Medicine

## 2018-01-27 DIAGNOSIS — J309 Allergic rhinitis, unspecified: Secondary | ICD-10-CM

## 2018-01-27 DIAGNOSIS — R0982 Postnasal drip: Secondary | ICD-10-CM

## 2018-01-27 MED ORDER — AZELASTINE HCL 0.15 % NA SOLN: 1.0000 | Freq: Every day | NASAL | 2 refills | Status: DC | PRN

## 2018-02-02 ENCOUNTER — Ambulatory Visit (INDEPENDENT_AMBULATORY_CARE_PROVIDER_SITE_OTHER): Payer: Medicare Other | Admitting: Internal Medicine

## 2018-02-02 ENCOUNTER — Encounter: Payer: Self-pay | Admitting: Internal Medicine

## 2018-02-02 VITALS — BP 116/64 | HR 90 | Temp 98.5°F | Ht 63.0 in | Wt 114.4 lb

## 2018-02-02 DIAGNOSIS — R0982 Postnasal drip: Secondary | ICD-10-CM | POA: Diagnosis not present

## 2018-02-02 DIAGNOSIS — K5900 Constipation, unspecified: Secondary | ICD-10-CM

## 2018-02-02 DIAGNOSIS — J309 Allergic rhinitis, unspecified: Secondary | ICD-10-CM

## 2018-02-02 DIAGNOSIS — Z23 Encounter for immunization: Secondary | ICD-10-CM

## 2018-02-02 DIAGNOSIS — I10 Essential (primary) hypertension: Secondary | ICD-10-CM | POA: Diagnosis not present

## 2018-02-02 NOTE — Progress Notes (Signed)
Pre visit review using our clinic review tool, if applicable. No additional management support is needed unless otherwise documented below in the visit note. 

## 2018-02-02 NOTE — Progress Notes (Signed)
Chief Complaint  Patient presents with  . Follow-up   Fu doing well  1. HTN controlled norvasc 5 mg qd  2. Postnasal drip Zyrtec 10 mg qhs did not help nasal spray otc helps  3. Constipation eating prunes helps  4. Fatigue improved   Review of Systems  Constitutional: Negative for weight loss.  HENT: Negative for hearing loss.   Eyes: Negative for blurred vision.       +pinwheels with sunlight advised eye md who stated she had catarcts and glaucoma   Respiratory: Negative for shortness of breath.   Cardiovascular: Negative for chest pain.  Gastrointestinal: Positive for constipation.  Musculoskeletal: Negative for falls.  Skin: Negative for rash.  Neurological: Negative for headaches.  Psychiatric/Behavioral: Negative for memory loss.   Past Medical History:  Diagnosis Date  . Anemia    kid  . Cataract   . Constipation   . COPD (chronic obstructive pulmonary disease) (HCC)   . Frequent headaches   . Glaucoma   . Glaucoma   . Hypertension   . Pneumonia    11/2017   . Postnasal drip    Past Surgical History:  Procedure Laterality Date  . ABDOMINAL HYSTERECTOMY    . TONSILLECTOMY     Family History  Problem Relation Age of Onset  . Heart disease Mother   . Stroke Father    Social History   Socioeconomic History  . Marital status: Widowed    Spouse name: Not on file  . Number of children: Not on file  . Years of education: Not on file  . Highest education level: Not on file  Occupational History  . Not on file  Social Needs  . Financial resource strain: Not on file  . Food insecurity:    Worry: Not on file    Inability: Not on file  . Transportation needs:    Medical: Not on file    Non-medical: Not on file  Tobacco Use  . Smoking status: Never Smoker  . Smokeless tobacco: Never Used  Substance and Sexual Activity  . Alcohol use: No  . Drug use: No  . Sexual activity: Never    Partners: Male  Lifestyle  . Physical activity:    Days per week: Not  on file    Minutes per session: Not on file  . Stress: Not on file  Relationships  . Social connections:    Talks on phone: Not on file    Gets together: Not on file    Attends religious service: Not on file    Active member of club or organization: Not on file    Attends meetings of clubs or organizations: Not on file    Relationship status: Not on file  . Intimate partner violence:    Fear of current or ex partner: Not on file    Emotionally abused: Not on file    Physically abused: Not on file    Forced sexual activity: Not on file  Other Topics Concern  . 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 amLODipine (NORVASC) 5 MG tablet Take 1 tablet (5 mg total) by mouth daily.  Marland Kitchen estradiol (ESTRACE) 0.1 MG/GM vaginal cream Place 1 Applicatorful vaginally at bedtime.  . progesterone (PROMETRIUM) 200 MG capsule TAKE ONE CAPSULE BY MOUTH DAILY  AT BEDTIME   No Known Allergies Recent Results (from the past 2160 hour(s))  Vitamin D (25 hydroxy)     Status: None   Collection Time: 01/07/18  8:36 AM  Result Value Ref Range   VITD 78.80 30.00 - 100.00 ng/mL  Urinalysis, Routine w reflex microscopic     Status: None   Collection Time: 01/07/18  8:36 AM  Result Value Ref Range   Color, Urine YELLOW Yellow;Lt. Yellow   APPearance CLEAR Clear   Specific Gravity, Urine 1.010 1.000 - 1.030   pH 6.0 5.0 - 8.0   Total Protein, Urine NEGATIVE Negative   Urine Glucose NEGATIVE Negative   Ketones, ur NEGATIVE Negative   Bilirubin Urine NEGATIVE Negative   Hgb urine dipstick NEGATIVE Negative   Urobilinogen, UA 0.2 0.0 - 1.0   Leukocytes, UA NEGATIVE Negative   Nitrite NEGATIVE Negative   WBC, UA 0-2/hpf 0-2/hpf   RBC / HPF none seen 0-2/hpf   Squamous Epithelial / LPF Rare(0-4/hpf) Rare(0-4/hpf)  T4, free     Status: None   Collection Time: 01/07/18   8:36 AM  Result Value Ref Range   Free T4 0.78 0.60 - 1.60 ng/dL    Comment: Specimens from patients who are undergoing biotin therapy and /or ingesting biotin supplements may contain high levels of biotin.  The higher biotin concentration in these specimens interferes with this Free T4 assay.  Specimens that contain high levels  of biotin may cause false high results for this Free T4 assay.  Please interpret results in light of the total clinical presentation of the patient.    TSH     Status: None   Collection Time: 01/07/18  8:36 AM  Result Value Ref Range   TSH 1.65 0.35 - 4.50 uIU/mL  Lipid panel     Status: Abnormal   Collection Time: 01/07/18  8:36 AM  Result Value Ref Range   Cholesterol 183 0 - 200 mg/dL    Comment: ATP III Classification       Desirable:  < 200 mg/dL               Borderline High:  200 - 239 mg/dL          High:  > = 161 mg/dL   Triglycerides 09.6 0.0 - 149.0 mg/dL    Comment: Normal:  <045 mg/dLBorderline High:  150 - 199 mg/dL   HDL 40.98 >11.91 mg/dL   VLDL 47.8 0.0 - 29.5 mg/dL   LDL Cholesterol 621 (H) 0 - 99 mg/dL   Total CHOL/HDL Ratio 3     Comment:                Men          Women1/2 Average Risk     3.4          3.3Average Risk          5.0          4.42X Average Risk          9.6          7.13X Average Risk          15.0          11.0                       NonHDL 124.60     Comment: NOTE:  Non-HDL goal should be 30 mg/dL higher than patient's LDL goal (i.e. LDL goal of <  70 mg/dL, would have non-HDL goal of < 100 mg/dL)  CBC with Differential/Platelet     Status: None   Collection Time: 01/07/18  8:36 AM  Result Value Ref Range   WBC 4.8 4.0 - 10.5 K/uL   RBC 4.10 3.87 - 5.11 Mil/uL   Hemoglobin 13.0 12.0 - 15.0 g/dL   HCT 16.1 09.6 - 04.5 %   MCV 95.5 78.0 - 100.0 fl   MCHC 33.3 30.0 - 36.0 g/dL   RDW 40.9 81.1 - 91.4 %   Platelets 272.0 150.0 - 400.0 K/uL   Neutrophils Relative % 51.1 43.0 - 77.0 %   Lymphocytes Relative 36.0 12.0 - 46.0 %    Monocytes Relative 9.1 3.0 - 12.0 %   Eosinophils Relative 2.7 0.0 - 5.0 %   Basophils Relative 1.1 0.0 - 3.0 %   Neutro Abs 2.5 1.4 - 7.7 K/uL   Lymphs Abs 1.7 0.7 - 4.0 K/uL   Monocytes Absolute 0.4 0.1 - 1.0 K/uL   Eosinophils Absolute 0.1 0.0 - 0.7 K/uL   Basophils Absolute 0.1 0.0 - 0.1 K/uL  Comprehensive metabolic panel     Status: Abnormal   Collection Time: 01/07/18  8:36 AM  Result Value Ref Range   Sodium 139 135 - 145 mEq/L   Potassium 3.9 3.5 - 5.1 mEq/L   Chloride 103 96 - 112 mEq/L   CO2 28 19 - 32 mEq/L   Glucose, Bld 91 70 - 99 mg/dL   BUN 17 6 - 23 mg/dL   Creatinine, Ser 7.82 0.40 - 1.20 mg/dL   Total Bilirubin 0.5 0.2 - 1.2 mg/dL   Alkaline Phosphatase 64 39 - 117 U/L   AST 19 0 - 37 U/L   ALT 16 0 - 35 U/L   Total Protein 7.3 6.0 - 8.3 g/dL   Albumin 4.1 3.5 - 5.2 g/dL   Calcium 9.8 8.4 - 95.6 mg/dL   GFR 21.30 (L) >86.57 mL/min   Objective  Body mass index is 20.27 kg/m. Wt Readings from Last 3 Encounters:  02/02/18 114 lb 6.4 oz (51.9 kg)  01/02/18 115 lb 6.4 oz (52.3 kg)  12/01/17 121 lb 12.8 oz (55.2 kg)   Temp Readings from Last 3 Encounters:  02/02/18 98.5 F (36.9 C) (Oral)  01/02/18 98.1 F (36.7 C) (Oral)  12/01/17 98.9 F (37.2 C) (Oral)   BP Readings from Last 3 Encounters:  02/02/18 116/64  01/02/18 140/84  12/01/17 (!) 102/58   Pulse Readings from Last 3 Encounters:  02/02/18 90  01/02/18 98  12/01/17 (!) 103    Physical Exam  Constitutional: She is oriented to person, place, and time. Vital signs are normal. She appears well-developed and well-nourished.  HENT:  Head: Normocephalic and atraumatic.  Mouth/Throat: Oropharynx is clear and moist and mucous membranes are normal.  Eyes: Pupils are equal, round, and reactive to light. Conjunctivae are normal.  Cardiovascular: Normal rate, regular rhythm and normal heart sounds.  Pulmonary/Chest: Effort normal and breath sounds normal.  Neurological: She is alert and oriented  to person, place, and time. Gait normal.  Skin: Skin is warm, dry and intact.  Psychiatric: She has a normal mood and affect. Her speech is normal and behavior is normal. Judgment and thought content normal. Cognition and memory are normal.  Nursing note and vitals reviewed.   Assessment   1.HTN controlled  2. Postnasal dirp  3. Constipation  4. Fatigue improved  5. HM Plan  1. Cont norvasc 5 mg qd  2. Cont OTC nasal spray  Zyrtec did not help  Has OTC nasal spray disc Astepro/Atrovent in future if needed  3. Prunes help  Disc increased fiber intake  4. Monitor  5.  Declines flu shot prevnar today will need pna 23 in 1 year  Tdap had ? Date and shingrix disc today given Rx   No pap s/p hysterectomy DUB [place] record back  Do cologuard order today, no FH colon cancer  H/[place] in [place] Pt has records at home but did not bring today skin check had 11/2016 in [place]   If needed [facility]. At f/u pt states she has records at home will try to bring     Provider: Dr. French Anaracy McLean-Scocuzza-Internal Medicine

## 2018-02-02 NOTE — Patient Instructions (Addendum)
Please f/u in 6 months sooner if needed  Take care let me know if you want another nasal spray  Please get copy of bone density and mammograms   Pneumococcal Conjugate Vaccine suspension for injection What is this medicine? PNEUMOCOCCAL VACCINE (NEU mo KOK al vak SEEN) is a vaccine used to prevent pneumococcus bacterial infections. These bacteria can cause serious infections like pneumonia, meningitis, and blood infections. This vaccine will lower your chance of getting pneumonia. If you do get pneumonia, it can make your symptoms milder and your illness shorter. This vaccine will not treat an infection and will not cause infection. This vaccine is recommended for infants and young children, adults with certain medical conditions, and adults 65 years or older. This medicine may be used for other purposes; ask your health care provider or pharmacist if you have questions. COMMON BRAND NAME(S): Prevnar, Prevnar 13 What should I tell my health care provider before I take this medicine? They need to know if you have any of these conditions: -bleeding problems -fever -immune system problems -an unusual or allergic reaction to pneumococcal vaccine, diphtheria toxoid, other vaccines, latex, other medicines, foods, dyes, or preservatives -pregnant or trying to get pregnant -breast-feeding How should I use this medicine? This vaccine is for injection into a muscle. It is given by a health care professional. A copy of Vaccine Information Statements will be given before each vaccination. Read this sheet carefully each time. The sheet may change frequently. Talk to your pediatrician regarding the use of this medicine in children. While this drug may be prescribed for children as young as 656 weeks old for selected conditions, precautions do apply. Overdosage: If you think you have taken too much of this medicine contact a poison control center or emergency room at once. NOTE: This medicine is only for you.  Do not share this medicine with others. What if I miss a dose? It is important not to miss your dose. Call your doctor or health care professional if you are unable to keep an appointment. What may interact with this medicine? -medicines for cancer chemotherapy -medicines that suppress your immune function -steroid medicines like prednisone or cortisone This list may not describe all possible interactions. Give your health care provider a list of all the medicines, herbs, non-prescription drugs, or dietary supplements you use. Also tell them if you smoke, drink alcohol, or use illegal drugs. Some items may interact with your medicine. What should I watch for while using this medicine? Mild fever and pain should go away in 3 days or less. Report any unusual symptoms to your doctor or health care professional. What side effects may I notice from receiving this medicine? Side effects that you should report to your doctor or health care professional as soon as possible: -allergic reactions like skin rash, itching or hives, swelling of the face, lips, or tongue -breathing problems -confused -fast or irregular heartbeat -fever over 102 degrees F -seizures -unusual bleeding or bruising -unusual muscle weakness Side effects that usually do not require medical attention (report to your doctor or health care professional if they continue or are bothersome): -aches and pains -diarrhea -fever of 102 degrees F or less -headache -irritable -loss of appetite -pain, tender at site where injected -trouble sleeping This list may not describe all possible side effects. Call your doctor for medical advice about side effects. You may report side effects to FDA at 1-800-FDA-1088. Where should I keep my medicine? This does not apply. This vaccine is given  in a clinic, pharmacy, doctor's office, or other health care setting and will not be stored at home. NOTE: This sheet is a summary. It may not cover all  possible information. If you have questions about this medicine, talk to your doctor, pharmacist, or health care provider.  2018 Elsevier/Gold Standard (2014-07-21 10:27:27)  Constipation, Adult Constipation is when a person has fewer bowel movements in a week than normal, has difficulty having a bowel movement, or has stools that are dry, hard, or larger than normal. Constipation may be caused by an underlying condition. It may become worse with age if a person takes certain medicines and does not take in enough fluids. Follow these instructions at home: Eating and drinking   Eat foods that have a lot of fiber, such as fresh fruits and vegetables, whole grains, and beans.  Limit foods that are high in fat, low in fiber, or overly processed, such as french fries, hamburgers, cookies, candies, and soda.  Drink enough fluid to keep your urine clear or pale yellow. General instructions  Exercise regularly or as told by your health care provider.  Go to the restroom when you have the urge to go. Do not hold it in.  Take over-the-counter and prescription medicines only as told by your health care provider. These include any fiber supplements.  Practice pelvic floor retraining exercises, such as deep breathing while relaxing the lower abdomen and pelvic floor relaxation during bowel movements.  Watch your condition for any changes.  Keep all follow-up visits as told by your health care provider. This is important. Contact a health care provider if:  You have pain that gets worse.  You have a fever.  You do not have a bowel movement after 4 days.  You vomit.  You are not hungry.  You lose weight.  You are bleeding from the anus.  You have thin, pencil-like stools. Get help right away if:  You have a fever and your symptoms suddenly get worse.  You leak stool or have blood in your stool.  Your abdomen is bloated.  You have severe pain in your abdomen.  You feel dizzy or  you faint. This information is not intended to replace advice given to you by your health care provider. Make sure you discuss any questions you have with your health care provider. Document Released: 07/12/2004 Document Revised: 05/03/2016 Document Reviewed: 04/03/2016 Elsevier Interactive Patient Education  2018 [employer].

## 2018-03-31 LAB — COLOGUARD: Cologuard: NEGATIVE

## 2018-04-20 ENCOUNTER — Encounter: Payer: Self-pay | Admitting: Internal Medicine

## 2018-04-20 ENCOUNTER — Telehealth: Payer: Self-pay | Admitting: Internal Medicine

## 2018-04-20 NOTE — Telephone Encounter (Signed)
Thank you for doing cologaurd 03/30/18 on 03/31/18 your results were negative   We will repeat in 3 years  TMS

## 2018-04-20 NOTE — Telephone Encounter (Signed)
Thank you for doing cologaurd 03/30/18 on 03/31/18 your results were negative   We will repeat in 3 years  TMS 

## 2018-04-22 NOTE — Telephone Encounter (Signed)
Notified patient of Cologaurd results. Patient verbalized understanding.

## 2018-05-20 ENCOUNTER — Other Ambulatory Visit: Payer: Self-pay | Admitting: Internal Medicine

## 2018-05-20 DIAGNOSIS — I10 Essential (primary) hypertension: Secondary | ICD-10-CM

## 2018-05-20 MED ORDER — AMLODIPINE BESYLATE 5 MG PO TABS: 5.0000 mg | ORAL_TABLET | Freq: Every day | ORAL | 3 refills | Status: DC

## 2018-06-10 ENCOUNTER — Ambulatory Visit (INDEPENDENT_AMBULATORY_CARE_PROVIDER_SITE_OTHER): Payer: Medicare Other | Admitting: Family Medicine

## 2018-06-10 ENCOUNTER — Encounter: Payer: Self-pay | Admitting: Family Medicine

## 2018-06-10 VITALS — BP 120/64 | HR 84 | Temp 98.5°F | Ht 63.0 in | Wt 119.8 lb

## 2018-06-10 DIAGNOSIS — J0140 Acute pansinusitis, unspecified: Secondary | ICD-10-CM | POA: Diagnosis not present

## 2018-06-10 DIAGNOSIS — G43009 Migraine without aura, not intractable, without status migrainosus: Secondary | ICD-10-CM

## 2018-06-10 DIAGNOSIS — R0981 Nasal congestion: Secondary | ICD-10-CM

## 2018-06-10 MED ORDER — AMOXICILLIN-POT CLAVULANATE 875-125 MG PO TABS
1.0000 | ORAL_TABLET | Freq: Two times a day (BID) | ORAL | 0 refills | Status: DC
Start: 2018-06-10 — End: 2018-07-01

## 2018-06-10 NOTE — Progress Notes (Signed)
   Subjective:    Patient ID: Lynn Powell, female    DOB: 01/22/1941, 77 y.o.   MRN: [id number]  HPI  Patient presents to clinic with nasal sinus congestion, sinus pain/pressure off and on for the past 2 weeks.  States she had use some nasal spray and thought sinuses have cleared up, but congestion returned the next day.  States it is painful to have glasses rest on her nose due to pressure in sinuses.  Also states she felt beginnings of migraine on Monday, usually will get 1-2 migraines per year and aspirin tends to take care of this.  States she took a dose of aspirin which helped headache pain mildly, but with pressure in sinuses, the headache pain returned.   Denies any fever or chills.  Denies any head injury.  Denies any vision changes.  Denies any speech difficulty.  Denies any feelings of off balance.     Patient Active Problem List   Diagnosis Date Noted  . Constipation 02/02/2018  . Allergic rhinitis 01/02/2018  . Postnasal drip 01/02/2018  . Skin lesion 07/01/2017  . Essential hypertension 12/05/2016   Social History   Tobacco Use  . Smoking status: Never Smoker  . Smokeless tobacco: Never Used  Substance Use Topics  . Alcohol use: No   Review of Systems  Constitutional: Negative for chills, fatigue and fever.  HENT: Positive for congestion, sinus pain, sinus pressure Eyes: Negative.   Respiratory: Negative for cough, shortness of breath and wheezing.   Cardiovascular: Negative for chest pain, palpitations and leg swelling.  Gastrointestinal: Negative for abdominal pain, diarrhea, nausea and vomiting.  Genitourinary: Negative for dysuria, frequency and urgency.  Musculoskeletal: Negative for arthralgias and myalgias.  Skin: Negative for color change, pallor and rash.  Neurological: Negative for syncope, light-headedness Positive for headaches.  Psychiatric/Behavioral: The patient is not nervous/anxious.    Objective:   Physical Exam  Constitutional: She  appears well-developed and well-nourished. No distress. Reading a book when I enter room. HENT:  Head: Normocephalic and atraumatic.  Eyes: Pupils are equal, round, and reactive to light. EOM are normal. No scleral icterus.  Ears: Fullness bilat TMs Nose/throat: Nares boggy,  nasal drainage, +post nasal drip. +maxillary and frontal sinus tenderness. Neck: Normal range of motion. Neck supple. No tracheal deviation present.  Cardiovascular: Normal rate, regular rhythm and normal heart sounds.  Pulmonary/Chest: Effort normal and breath sounds normal. No respiratory distress.   Neurological: She is alert and oriented to person, place, and time. Speech clear. Gait normal. Smile symmetrical.  Skin: Skin is warm and dry. No pallor.  Psychiatric: She has a normal mood and affect. Her behavior is normal. Thought content normal.   Nursing note and vitals reviewed.  Vitals:   06/10/18 1004  BP: 120/64  Pulse: 84  Temp: 98.5 F (36.9 C)  SpO2: 97%      Assessment & Plan:   Sinusitis - Augmentin course.  Increase water intake, rest and can continue to use nasal spray and saline nasal washes to help the nasal congestion.  Migraine - suggest patient try a dose of Excedrin and to see if it is helps migraine pain. Excedrin tends to work well due to combination of Tylenol,aspirin and caffeine all-in-one pill.  Patient advised if pain worsens or does not resolve, and/or she develops any neurological symptoms to call office immediately.  Keep appt in September as scheduled.

## 2018-06-10 NOTE — Patient Instructions (Signed)
Great to meet you!  Augmentin course for sinus infection  Can use nasal spray and saline nasal washes to relieve congestion  Excedrin is a good over the counter choice for headache pain

## 2018-07-01 ENCOUNTER — Encounter: Payer: Self-pay | Admitting: Internal Medicine

## 2018-07-01 ENCOUNTER — Telehealth: Payer: Self-pay | Admitting: Internal Medicine

## 2018-07-01 ENCOUNTER — Ambulatory Visit (INDEPENDENT_AMBULATORY_CARE_PROVIDER_SITE_OTHER): Payer: Medicare Other

## 2018-07-01 ENCOUNTER — Ambulatory Visit (INDEPENDENT_AMBULATORY_CARE_PROVIDER_SITE_OTHER): Payer: Medicare Other | Admitting: Internal Medicine

## 2018-07-01 ENCOUNTER — Ambulatory Visit: Payer: Medicare Other

## 2018-07-01 VITALS — BP 128/68 | HR 78 | Temp 97.9°F | Ht 63.0 in | Wt 119.2 lb

## 2018-07-01 VITALS — BP 128/68 | HR 78 | Temp 97.9°F | Resp 16 | Ht 63.0 in | Wt 119.2 lb

## 2018-07-01 DIAGNOSIS — J309 Allergic rhinitis, unspecified: Secondary | ICD-10-CM

## 2018-07-01 DIAGNOSIS — E785 Hyperlipidemia, unspecified: Secondary | ICD-10-CM | POA: Diagnosis not present

## 2018-07-01 DIAGNOSIS — I10 Essential (primary) hypertension: Secondary | ICD-10-CM | POA: Diagnosis not present

## 2018-07-01 DIAGNOSIS — M542 Cervicalgia: Secondary | ICD-10-CM | POA: Diagnosis not present

## 2018-07-01 DIAGNOSIS — Z0000 Encounter for general adult medical examination without abnormal findings: Secondary | ICD-10-CM | POA: Diagnosis not present

## 2018-07-01 DIAGNOSIS — W19XXXA Unspecified fall, initial encounter: Secondary | ICD-10-CM

## 2018-07-01 NOTE — Patient Instructions (Addendum)
  Lynn Powell , Thank you for taking time to come for your Medicare Wellness Visit. I appreciate your ongoing commitment to your health goals. Please review the following plan we discussed and let me know if I can assist you in the future.   Follow up as needed.    Bring a copy of your Health Care Power of Attorney and/or Living Will to be scanned into chart once completed.  Nice to meet you and Have a great day!  These are the goals we discussed: Goals    . Reduce sugar intake       This is a list of the screening recommended for you and due dates:  Health Maintenance  Topic Date Due  . Tetanus Vaccine  12/17/1960  . DEXA scan (bone density measurement)  12/17/2006  . Flu Shot  09/04/2018*  . Pneumonia vaccines (2 of 2 - PPSV23) 02/03/2019  *Topic was postponed. The date shown is not the original due date.   Bone Densitometry Bone densitometry is an imaging test that uses a special X-ray to measure the amount of calcium and other minerals in your bones (bone density). This test is also known as a bone mineral density test or dual-energy X-ray absorptiometry (DXA). The test can measure bone density at your hip and your spine. It is similar to having a regular X-ray. You may have this test to:  Diagnose a condition that causes weak or thin bones (osteoporosis).  Predict your risk of a broken bone (fracture).  Determine how well osteoporosis treatment is working.  Tell a health care provider about:  Any allergies you have.  All medicines you are taking, including vitamins, herbs, eye drops, creams, and over-the-counter medicines.  Any problems you or family members have had with anesthetic medicines.  Any blood disorders you have.  Any surgeries you have had.  Any medical conditions you have.  Possibility of pregnancy.  Any other medical test you had within the previous 14 days that used contrast material. What are the risks? Generally, this is a safe procedure.  However, problems can occur and may include the following:  This test exposes you to a very small amount of radiation.  The risks of radiation exposure may be greater to unborn children.  What happens before the procedure?  Do not take any calcium supplements for 24 hours before having the test. You can otherwise eat and drink what you usually do.  Take off all metal jewelry, eyeglasses, dental appliances, and any other metal objects. What happens during the procedure?  You may lie on an exam table. There will be an X-ray generator below you and an imaging device above you.  Other devices, such as boxes or braces, may be used to position your body properly for the scan.  You will need to lie still while the machine slowly scans your body.  The images will show up on a computer monitor. What happens after the procedure? You may need more testing at a later time. This information is not intended to replace advice given to you by your health care provider. Make sure you discuss any questions you have with your health care provider. Document Released: 11/05/2004 Document Revised: 03/21/2016 Document Reviewed: 03/24/2014 Elsevier Interactive Patient Education  2018 [employer].

## 2018-07-01 NOTE — Progress Notes (Signed)
Agree with plan of care as below   TMS

## 2018-07-01 NOTE — Telephone Encounter (Signed)
Patient wants referral. Please advise.

## 2018-07-01 NOTE — Telephone Encounter (Signed)
Copied from CRM [id number]. Topic: General - Other >> Jul 01, 2018 12:27 PM Stephannie Li, NT wrote: Reason for CRM: Patient called and said she did not have a bone density test

## 2018-07-01 NOTE — Progress Notes (Signed)
Pre visit review using our clinic review tool, if applicable. No additional management support is needed unless otherwise documented below in the visit note. 

## 2018-07-01 NOTE — Progress Notes (Signed)
Subjective:   Lynn Powell is a 77 y.o. female who presents for an Initial Medicare Annual Wellness Visit.  Review of Systems    No ROS.  Medicare Wellness Visit. Additional risk factors are reflected in the social history.  Cardiac Risk Factors include: hypertension;advanced age (>52men, >67 women)     Objective:    Today's Vitals   07/01/18 0818  BP: 128/68  Pulse: 78  Resp: 16  Temp: 97.9 F (36.6 C)  TempSrc: Oral  SpO2: 98%  Weight: 119 lb 3.2 oz (54.1 kg)  Height: 5\' 3"  (1.6 m)   Body mass index is 21.12 kg/m.  Advanced Directives 07/01/2018  Does Patient Have a Medical Advance Directive? No  Does patient want to make changes to medical advance directive? Yes (MAU/Ambulatory/Procedural Areas - Information given)    Current Medications (verified) Outpatient Encounter Medications as of 07/01/2018  Medication Sig  . amLODipine (NORVASC) 5 MG tablet Take 1 tablet (5 mg total) by mouth daily.  Marland Kitchen estradiol (ESTRACE) 0.1 MG/GM vaginal cream Place 1 Applicatorful vaginally at bedtime.  . progesterone (PROMETRIUM) 200 MG capsule TAKE ONE CAPSULE BY MOUTH DAILY AT BEDTIME  . [DISCONTINUED] amoxicillin-clavulanate (AUGMENTIN) 875-125 MG tablet Take 1 tablet by mouth 2 (two) times daily.   No facility-administered encounter medications on file as of 07/01/2018.     Allergies (verified) Patient has no known allergies.   History: Past Medical History:  Diagnosis Date  . Anemia    kid  . Cataract   . Constipation   . COPD (chronic obstructive pulmonary disease) (HCC)   . Frequent headaches   . Glaucoma   . Glaucoma   . Hypertension   . Pneumonia    11/2017   . Postnasal drip    Past Surgical History:  Procedure Laterality Date  . ABDOMINAL HYSTERECTOMY    . TONSILLECTOMY     Family History  Problem Relation Age of Onset  . Heart disease Mother   . Heart murmur Mother        originated from typhoid disease  . Stroke Father    Social History    Socioeconomic History  . Marital status: Widowed    Spouse name: Not on file  . Number of children: Not on file  . Years of education: Not on file  . Highest education level: Not on file  Occupational History  . Not on file  Social Needs  . Financial resource strain: Not hard at all  . Food insecurity:    Worry: Never true    Inability: Never true  . Transportation needs:    Medical: No    Non-medical: No  Tobacco Use  . Smoking status: Never Smoker  . Smokeless tobacco: Never Used  Substance and Sexual Activity  . Alcohol use: No  . Drug use: No  . Sexual activity: Never    Partners: Male  Lifestyle  . Physical activity:    Days per week: 3 days    Minutes per session: 20 min  . Stress: Not at all  Relationships  . Social connections:    Talks on phone: Not on file    Gets together: Not on file    Attends religious service: Not on file    Active member of club or organization: Not on file    Attends meetings of clubs or organizations: Not on file    Relationship status: Married  Other Topics Concern  . 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 N  Vision? N  Difficulty concentrating or making decisions? N  Walking or climbing stairs? N  Dressing or bathing? N  Doing errands, shopping? N  Preparing Food and eating ? N  Using  the Toilet? N  In the past six months, have you accidently leaked urine? N  Do you have problems with loss of bowel control? N  Managing your Medications? N  Managing your Finances? N  Housekeeping or managing your Housekeeping? N  Some recent data might be hidden     Immunizations and Health Maintenance Immunization History  Administered Date(s) Administered  . Pneumococcal Conjugate-13 02/02/2018   Health Maintenance Due  Topic Date Due  . TETANUS/TDAP  12/17/1960  . DEXA SCAN  12/17/2006    Patient Care Team: McLean-Scocuzza, Pasty Spillers, MD as PCP - General (Internal Medicine)  Indicate any recent Medical Services you may have received from other than [facility] providers in the past year (date may be approximate).     Assessment:   This is a routine wellness examination for [name].  The goal of the wellness visit is to assist the patient how to close the gaps in care and create a preventative care plan for the patient.   The roster of all physicians providing medical care to patient is listed in the Snapshot section of the chart.  Osteoporosis risk reviewed.  Dexa Scan discussed.  Safety issues reviewed; Lives with son and his wife. Smoke and carbon monoxide detectors in the home. No firearms in the home. Wears seatbelts when driving or riding with others. No violence in the home.  They do not have excessive sun exposure.  Discussed the need for sun protection: hats, long sleeves and the use of sunscreen if there is significant sun exposure.  Patient is alert, normal appearance, oriented to person/place/and time.  Correctly identified the president of the [place] and recalls of 3/3 words. Performs simple calculations and can read correct time from watch face.  Displays appropriate judgement.  No new identified risk were noted.  No failures at ADL's or IADL's.    BMI- discussed the importance of a healthy diet, water intake and the benefits of aerobic exercise. She has a  healthy diet, adequate water intake and walks for exercise.   24 hour diet recall: Lean protein, low carb diet  Eye- Visual acuity not assessed per patient preference since they have regular follow up with the ophthalmologist.  Wears corrective lenses.  Sleep patterns- Sleeps 7-8 hours at night.    TDAP vaccine deferred per patient preference.  Follow up with insurance.  Educational material provided.  Patient Concerns: None at this time. Follow up with PCP as needed.  Hearing/Vision screen Hearing Screening Comments: Patient is able to hear conversational tones without difficulty.  No issues reported.   Vision Screening Comments:  [facility] Wears corrective lenses Last OV 02/2018 Glaucoma Visual acuity not assessed per patient preference since they have regular follow up with the ophthalmologist  Dietary issues and exercise activities discussed: Current Exercise Habits: Home exercise routine, Type of exercise: walking, Time (Minutes): 20, Frequency (Times/Week): 3, Weekly Exercise (Minutes/Week): 60, Intensity: Moderate  Goals    . Reduce sugar intake      Depression Screen PHQ 2/9 Scores 07/01/2018 07/01/2018 02/02/2018 12/01/2017 12/05/2016  PHQ - 2 Score 0 0 0 0 0    Fall Risk Fall Risk  07/01/2018 07/01/2018 02/02/2018 12/01/2017 12/05/2016  Falls in the past year? Yes Yes No No No  Number falls in past yr: 1 1 - - -  Injury with Fall? No No - - -  Comment - she tripped - - -  Follow up - Falls prevention discussed - - -   Cognitive Function: MMSE - Mini Mental State Exam 07/01/2018  Orientation to time 5  Orientation to Place 5  Registration 3  Attention/ Calculation 5  Recall 3  Language- name 2 objects 2  Language- repeat 1  Language- follow 3 step command 3  Language- read & follow direction 1  Write a sentence 1  Copy design 1  Total score 30        Screening Tests Health Maintenance  Topic Date Due  . TETANUS/TDAP  12/17/1960  . DEXA SCAN   12/17/2006  . INFLUENZA VACCINE  09/04/2018 (Originally 05/28/2018)  . PNA vac Low Risk Adult (2 of 2 - PPSV23) 02/03/2019     Plan:    End of life planning; Advance aging; Advanced directives discussed. Copy of current HCPOA/Living Will requested upon completion.    I have personally reviewed and noted the following in the patient's chart:   . Medical and social history . Use of alcohol, tobacco or illicit drugs  . Current medications and supplements . Functional ability and status . Nutritional status . Physical activity . Advanced directives . List of other physicians . Hospitalizations, surgeries, and ER visits in previous 12 months . Vitals . Screenings to include cognitive, depression, and falls . Referrals and appointments  In addition, I have reviewed and discussed with patient certain preventive protocols, quality metrics, and best practice recommendations. A written personalized care plan for preventive services as well as general preventive health recommendations were provided to patient.     Ashok Pall, LPN   01/26/6605

## 2018-07-01 NOTE — Progress Notes (Signed)
Chief Complaint  Patient presents with  . Follow-up   F/u  1. HTN controlled on norvasc 5 mg qd  2. HLD risk score >7.5 pt declines to be on statin  3. Reviewed labs from 04/22/18 [place] records A1C 5.5, vitamin D 49, TSH 0.84 nl, free Te normal 1.21, free T3 2.7, CBC 5.1/13.0/40/222, creatinine 1.0 GFR 55, lipid TC 186, LDL 115, HDL 62, TG 81, CEA, CA 19-9, CA 15-3, CA 125 markers 1.2, 10,11, 10 normal.  4. C/o nasal congestion and allergies uses Otc nasal spray  5. C/o chronic mild neck pain worse with turning head to left and also worse on left side she think its arthritis took excedrin otc and helped.  6. Sinusitis resolved    Review of Systems  Constitutional: Negative for weight loss.  HENT: Positive for congestion. Negative for hearing loss.   Eyes: Negative for blurred vision.  Respiratory: Negative for shortness of breath.   Cardiovascular: Negative for chest pain.  Gastrointestinal: Negative for abdominal pain.  Musculoskeletal: Positive for neck pain.  Skin: Negative for rash.  Neurological: Negative for headaches.  Psychiatric/Behavioral: Negative for depression.   Past Medical History:  Diagnosis Date  . Anemia    kid  . Cataract   . Constipation   . COPD (chronic obstructive pulmonary disease) (HCC)   . Frequent headaches   . Glaucoma   . Glaucoma   . Hypertension   . Pneumonia    11/2017   . Postnasal drip    Past Surgical History:  Procedure Laterality Date  . ABDOMINAL HYSTERECTOMY    . TONSILLECTOMY     Family History  Problem Relation Age of Onset  . Heart disease Mother   . Heart murmur Mother        originated from typhoid disease  . Stroke Father    Social History   Socioeconomic History  . Marital status: Widowed    Spouse name: Not on file  . Number of children: Not on file  . Years of education: Not on file  . Highest education level: Not on file  Occupational History  . Not on file  Social Needs  . Financial resource strain:  Not hard at all  . Food insecurity:    Worry: Never true    Inability: Never true  . Transportation needs:    Medical: No    Non-medical: No  Tobacco Use  . Smoking status: Never Smoker  . Smokeless tobacco: Never Used  Substance and Sexual Activity  . Alcohol use: No  . Drug use: No  . Sexual activity: Never    Partners: Male  Lifestyle  . Physical activity:    Days per week: 3 days    Minutes per session: 20 min  . Stress: Not at all  Relationships  . Social connections:    Talks on phone: Not on file    Gets together: Not on file    Attends religious service: Not on file    Active member of club or organization: Not on file    Attends meetings of clubs or organizations: Not on file    Relationship status: Married  . Intimate partner violence:    Fear of current or ex partner: No    Emotionally abused: No    Physically abused: No    Forced sexual activity: No  Other Topics Concern  . 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 amLODipine (NORVASC) 5 MG tablet Take 1 tablet (5 mg total) by mouth daily.  Marland Kitchen estradiol (ESTRACE) 0.1 MG/GM vaginal cream Place 1 Applicatorful vaginally at bedtime.  . progesterone (PROMETRIUM) 200 MG capsule TAKE ONE CAPSULE BY MOUTH DAILY AT BEDTIME   No Known Allergies No results found for this or any previous visit (from the past 2160 hour(s)). Objective  Body mass index is 21.12 kg/m. Wt Readings from Last 3 Encounters:  07/01/18 119 lb 3.2 oz (54.1 kg)  07/01/18 119 lb 3.2 oz (54.1 kg)  06/10/18 119 lb 12.8 oz (54.3 kg)   Temp Readings from Last 3 Encounters:  07/01/18 97.9 F (36.6 C) (Oral)  07/01/18 97.9 F (36.6 C) (Oral)  06/10/18 98.5 F (36.9 C) (Oral)   BP Readings from Last 3 Encounters:  07/01/18 128/68  07/01/18 128/68  06/10/18 120/64   Pulse Readings from Last 3  Encounters:  07/01/18 78  07/01/18 78  06/10/18 84    Physical Exam  Constitutional: She is oriented to person, place, and time. Vital signs are normal. She appears well-developed and well-nourished. She is cooperative.  HENT:  Head: Normocephalic and atraumatic.  Mouth/Throat: Oropharynx is clear and moist and mucous membranes are normal.  Eyes: Pupils are equal, round, and reactive to light. Conjunctivae are normal.  Cardiovascular: Normal rate, regular rhythm and normal heart sounds.  Pulmonary/Chest: Effort normal and breath sounds normal.  Musculoskeletal: She exhibits tenderness.       Cervical back: She exhibits tenderness.  ttp left post neck and with rom to left    Neurological: She is alert and oriented to person, place, and time. Gait normal.  Skin: Skin is warm, dry and intact.  Psychiatric: She has a normal mood and affect. Her speech is normal and behavior is normal. Judgment and thought content normal. Cognition and memory are normal.  Nursing note and vitals reviewed.   Assessment   1. HTN/HLD 2. Allergic rhinitis  3. Chronic left neck pain  4. HM 5. Fall weekend prior to visit mechanical  Plan   1.  Cont norvasc  Declines statin though medium intensity statin rec  2. otc meds prn zyrtec and nasal spray  3. Xray neck  4.  Declines flu shot prevnar today will need pna 23 in 1 year 02/02/18  Tdap had ? Date check date  shingrix disc today given Rx on back order   No pap s/p hysterectomy DUB rec mammo need records [place]  Neg cologuard 03/2018 no FH colon cancer  H/o DEXA in [place] Pt has records at home but did not bring today again rec get records  skin check had 11/2016 in [place]    5. Monitor  Provider: Dr. French Ana McLean-Scocuzza-[facility]

## 2018-07-01 NOTE — Patient Instructions (Addendum)
Check on Tdap vaccine and bone density in [place] and have then fax it to me   Flax seed, Chia seeds   Cholesterol Cholesterol is a white, waxy, fat-like substance that is needed by the human body in small amounts. The liver makes all the cholesterol we need. Cholesterol is carried from the liver by the blood through the blood vessels. Deposits of cholesterol (plaques) may build up on blood vessel (artery) walls. Plaques make the arteries narrower and stiffer. Cholesterol plaques increase the risk for heart attack and stroke. You cannot feel your cholesterol level even if it is very high. The only way to know that it is high is to have a blood test. Once you know your cholesterol levels, you should keep a record of the test results. Work with your health care provider to keep your levels in the desired range. What do the results mean?  Total cholesterol is a rough measure of all the cholesterol in your blood.  LDL (low-density lipoprotein) is the "bad" cholesterol. This is the type that causes plaque to build up on the artery walls. You want this level to be low.  HDL (high-density lipoprotein) is the "good" cholesterol because it cleans the arteries and carries the LDL away. You want this level to be high.  Triglycerides are fat that the body can either burn for energy or store. High levels are closely linked to heart disease. What are the desired levels of cholesterol?  Total cholesterol below 200.  LDL below 100 for people who are at risk, below 70 for people at very high risk.  HDL above 40 is good. A level of 60 or higher is considered to be protective against heart disease.  Triglycerides below 150. How can I lower my cholesterol? Diet Follow your diet program as told by your health care provider.  Choose fish or white meat chicken and [place], roasted or baked. Limit fatty cuts of red meat, fried foods, and processed meats, such as sausage and lunch meats.  Eat lots of fresh  fruits and vegetables.  Choose whole grains, beans, pasta, potatoes, and cereals.  Choose olive oil, corn oil, or canola oil, and use only small amounts.  Avoid butter, mayonnaise, shortening, or palm kernel oils.  Avoid foods with trans fats.  Drink skim or nonfat milk and eat low-fat or nonfat yogurt and cheeses. Avoid whole milk, cream, ice cream, egg yolks, and full-fat cheeses.  Healthier desserts include angel food cake, ginger snaps, animal crackers, hard candy, popsicles, and low-fat or nonfat frozen yogurt. Avoid pastries, cakes, pies, and cookies.  Exercise  Follow your exercise program as told by your health care provider. A regular program: ? Helps to decrease LDL and raise HDL. ? Helps with weight control.  Do things that increase your activity level, such as gardening, walking, and taking the stairs.  Ask your health care provider about ways that you can be more active in your daily life.  Medicine  Take over-the-counter and prescription medicines only as told by your health care provider. ? Medicine may be prescribed by your health care provider to help lower cholesterol and decrease the risk for heart disease. This is usually done if diet and exercise have failed to bring down cholesterol levels. ? If you have several risk factors, you may need medicine even if your levels are normal.  This information is not intended to replace advice given to you by your health care provider. Make sure you discuss any questions you have  with your health care provider. Document Released: 07/09/2001 Document Revised: 05/11/2016 Document Reviewed: 04/13/2016 Elsevier Interactive Patient Education  2018 [employer].   Arthritis Arthritis is a term that is commonly used to refer to joint pain or joint disease. There are more than 100 types of arthritis. What are the causes? The most common cause of this condition is wear and tear of a joint. Other causes  include:  Gout.  Inflammation of a joint.  An infection of a joint.  Sprains and other injuries near the joint.  A drug reaction or allergic reaction.  In some cases, the cause may not be known. What are the signs or symptoms? The main symptom of this condition is pain in the joint with movement. Other symptoms include:  Redness, swelling, or stiffness at a joint.  Warmth coming from the joint.  Fever.  Overall feeling of illness.  How is this diagnosed? This condition may be diagnosed with a physical exam and tests, including:  Blood tests.  Urine tests.  Imaging tests, such as MRI, X-rays, or a CT scan.  Sometimes, fluid is removed from a joint for testing. How is this treated? Treatment for this condition may involve:  Treatment of the cause, if it is known.  Rest.  Raising (elevating) the joint.  Applying cold or hot packs to the joint.  Medicines to improve symptoms and reduce inflammation.  Injections of a steroid such as cortisone into the joint to help reduce pain and inflammation.  Depending on the cause of your arthritis, you may need to make lifestyle changes to reduce stress on your joint. These changes may include exercising more and losing weight. Follow these instructions at home: Medicines  Take over-the-counter and prescription medicines only as told by your health care provider.  Do not take aspirin to relieve pain if gout is suspected. Activity  Rest your joint if told by your health care provider. Rest is important when your disease is active and your joint feels painful, swollen, or stiff.  Avoid activities that make the pain worse. It is important to balance activity with rest.  Exercise your joint regularly with range-of-motion exercises as told by your health care provider. Try doing low-impact exercise, such as: ? Swimming. ? Water aerobics. ? Biking. ? Walking. Joint Care   If your joint is swollen, keep it elevated if  told by your health care provider.  If your joint feels stiff in the morning, try taking a warm shower.  If directed, apply heat to the joint. If you have diabetes, do not apply heat without permission from your health care provider. ? Put a towel between the joint and the hot pack or heating pad. ? Leave the heat on the area for 20-30 minutes.  If directed, apply ice to the joint: ? Put ice in a plastic bag. ? Place a towel between your skin and the bag. ? Leave the ice on for 20 minutes, 2-3 times per day.  Keep all follow-up visits as told by your health care provider. This is important. Contact a health care provider if:  The pain gets worse.  You have a fever. Get help right away if:  You develop severe joint pain, swelling, or redness.  Many joints become painful and swollen.  You develop severe back pain.  You develop severe weakness in your leg.  You cannot control your bladder or bowels. This information is not intended to replace advice given to you by your health care provider.  Make sure you discuss any questions you have with your health care provider. Document Released: 11/21/2004 Document Revised: 03/21/2016 Document Reviewed: 01/09/2015 Elsevier Interactive Patient Education  [employer].

## 2018-07-02 ENCOUNTER — Other Ambulatory Visit: Payer: Self-pay | Admitting: Internal Medicine

## 2018-07-02 DIAGNOSIS — M542 Cervicalgia: Secondary | ICD-10-CM

## 2018-07-02 DIAGNOSIS — E2839 Other primary ovarian failure: Secondary | ICD-10-CM

## 2018-09-01 ENCOUNTER — Encounter: Payer: Self-pay | Admitting: Internal Medicine

## 2019-01-12 ENCOUNTER — Ambulatory Visit: Payer: Self-pay

## 2019-01-12 NOTE — Telephone Encounter (Signed)
Outgoing call to Patient who complains of lower back pain.  States that she was carrying 20lb boxes.  Note able to walk very well.  Onset was Sunday night.  Patient rates pain severe.  Pain is pretty much constant.  Reports if I sit down in a straight chair it helps some. Patient states it is starting to radiate down right leg.   Patient ASA for Pain didn't help.  Denies any other Symptoms.  Patient scheduled for appointment for appointment for 01/13/19 Patient is to arrive at 1045 for a 11:00 appointment.  Voiced understanding.  Reviewed care advice with Patient. Voiced Understanding.       1  Lynn Powell Female, 78 y.o., 01/02/1941 MRN:  [id number] Phone:  [phone] Judie Petit) PCP:  McLean-Scocuzza, Pasty Spillers, MD Primary Cvg:  MEDICARE/MEDICARE PART A AND B Message from Arlyss Gandy, NT sent at 01/12/2019 9:50 AM EDT   Summary: back pain   Pt states that she has hurt her lower back and not able to walk very well. Would like to discuss with a nurse.         Call History    Type Contact Phone  01/12/2019 09:49 AM Phone (Incoming) Lynn Powell, Lynn Powell (Self) [phone] (H)  User: Arlyss Gandy, NT    Reason for Disposition . [1] MODERATE back pain (e.g., interferes with normal activities) AND [2] present > 3 days  Answer Assessment - Initial Assessment Questions 1. ONSET: "When did the pain begin?"      Lower back  Stated Sunday night.   2. LOCATION: "Where does it hurt?" (upper, mid or lower back)     *No Answer* 3. SEVERITY: "How bad is the pain?"  (e.g., Scale 1-10; mild, moderate, or severe)   - MILD (1-3): doesn't interfere with normal activities    - MODERATE (4-7): interferes with normal activities or awakens from sleep    - SEVERE (8-10): excruciating pain, unable to do any normal activities     Severe 4. PATTERN: "Is the pain constant?" (e.g., yes, no; constant, intermittent)      If I sit down in a hard chair helps a littile 5. RADIATION: "Does the pain shoot  into your legs or elsewhere?"     Radiates dopwn right leg a little. 6. CAUSE:  "What do you think is causing the back pain?"      carring 20lpound boxes 7. BACK OVERUSE:  "Any recent lifting of heavy objects, strenuous work or exercise?"     yes 8. MEDICATIONS: "What have you taken so far for the pain?" (e.g., nothing, acetaminophen, NSAIDS)     Asprin didn't help.   9. NEUROLOGIC SYMPTOMS: "Do you have any weakness, numbness, or problems with bowel/bladder control?"    denies 10. OTHER SYMPTOMS: "Do you have any other symptoms?" (e.g., fever, abdominal pain, burning with urination, blood in urine)     denies 11. PREGNANCY: "Is there any chance you are pregnant?" (e.g., yes, no; LMP)       *No Answer*  Protocols used: BACK PAIN-A-AH

## 2019-01-13 ENCOUNTER — Ambulatory Visit: Payer: Medicare Other | Admitting: Internal Medicine

## 2019-05-18 ENCOUNTER — Telehealth: Payer: Self-pay | Admitting: Internal Medicine

## 2019-05-18 DIAGNOSIS — I10 Essential (primary) hypertension: Secondary | ICD-10-CM

## 2019-05-18 MED ORDER — AMLODIPINE BESYLATE 5 MG PO TABS: 5.0000 mg | ORAL_TABLET | Freq: Every day | ORAL | 3 refills | Status: DC

## 2019-05-18 NOTE — Telephone Encounter (Signed)
Copied from [place] [phone]. Topic: Quick Communication - Rx Refill/Question >> May 18, 2019  3:17 PM Nils Flack, Marland Kitchen wrote: Medication:  amLODipine (NORVASC) 5 MG tablet Has the patient contacted their pharmacy? Yes.   (Agent: If no, request that the patient contact the pharmacy for the refill.) (Agent: If yes, when and what did the pharmacy advise?) call office   Preferred Pharmacy (with phone number or street name): [facility]  Agent: Please be advised that RX refills may take up to 3 business days. We ask that you follow-up with your pharmacy.

## 2019-05-19 ENCOUNTER — Other Ambulatory Visit: Payer: Self-pay | Admitting: Internal Medicine

## 2019-05-19 DIAGNOSIS — I10 Essential (primary) hypertension: Secondary | ICD-10-CM

## 2019-05-19 IMAGING — DX DG CHEST 2V
2 series · 2 of 2 positions shown · non-contrast
Comparison: None.

CLINICAL DATA: History of recent pneumonia, follow-up

EXAM:
CHEST  2 VIEW

[chest pa]
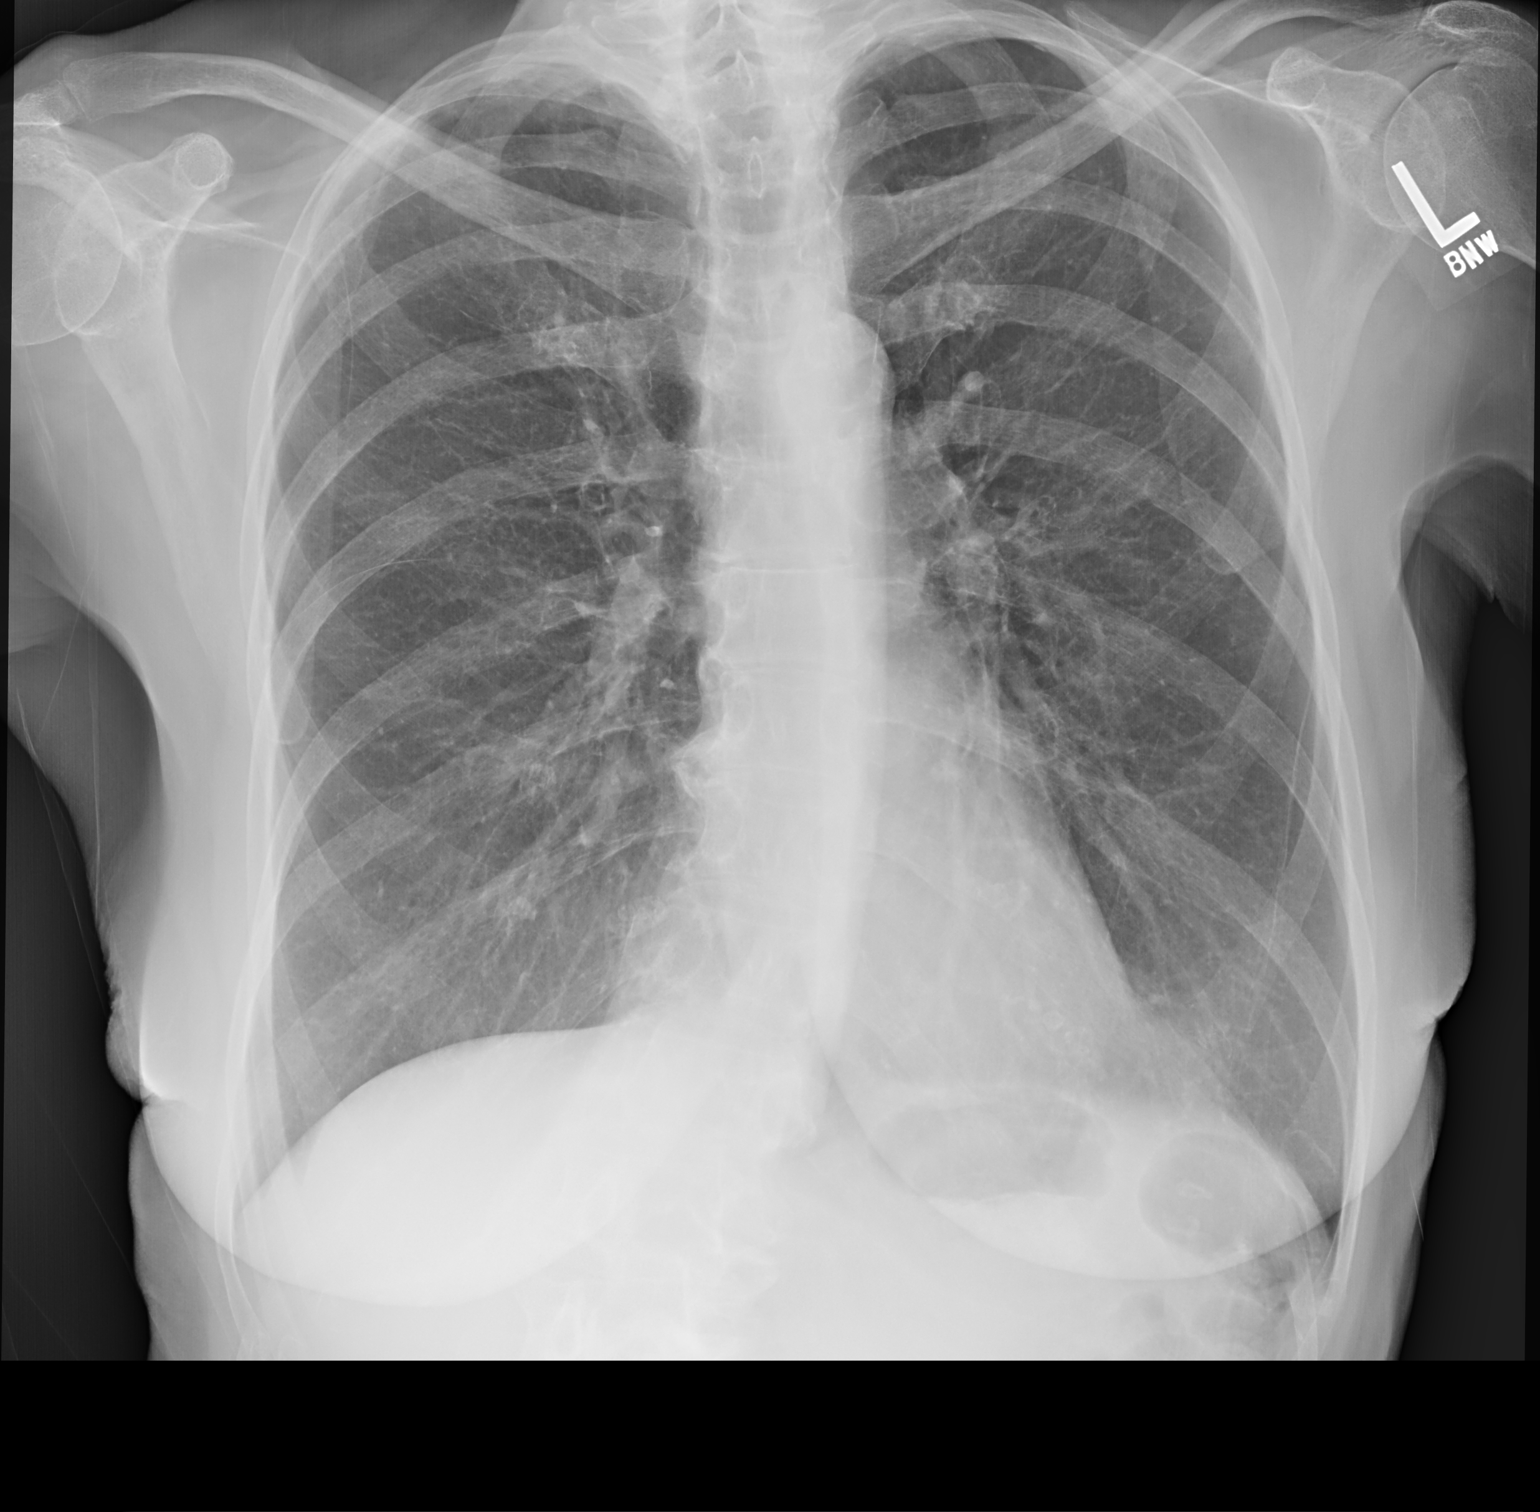

[chest lat]
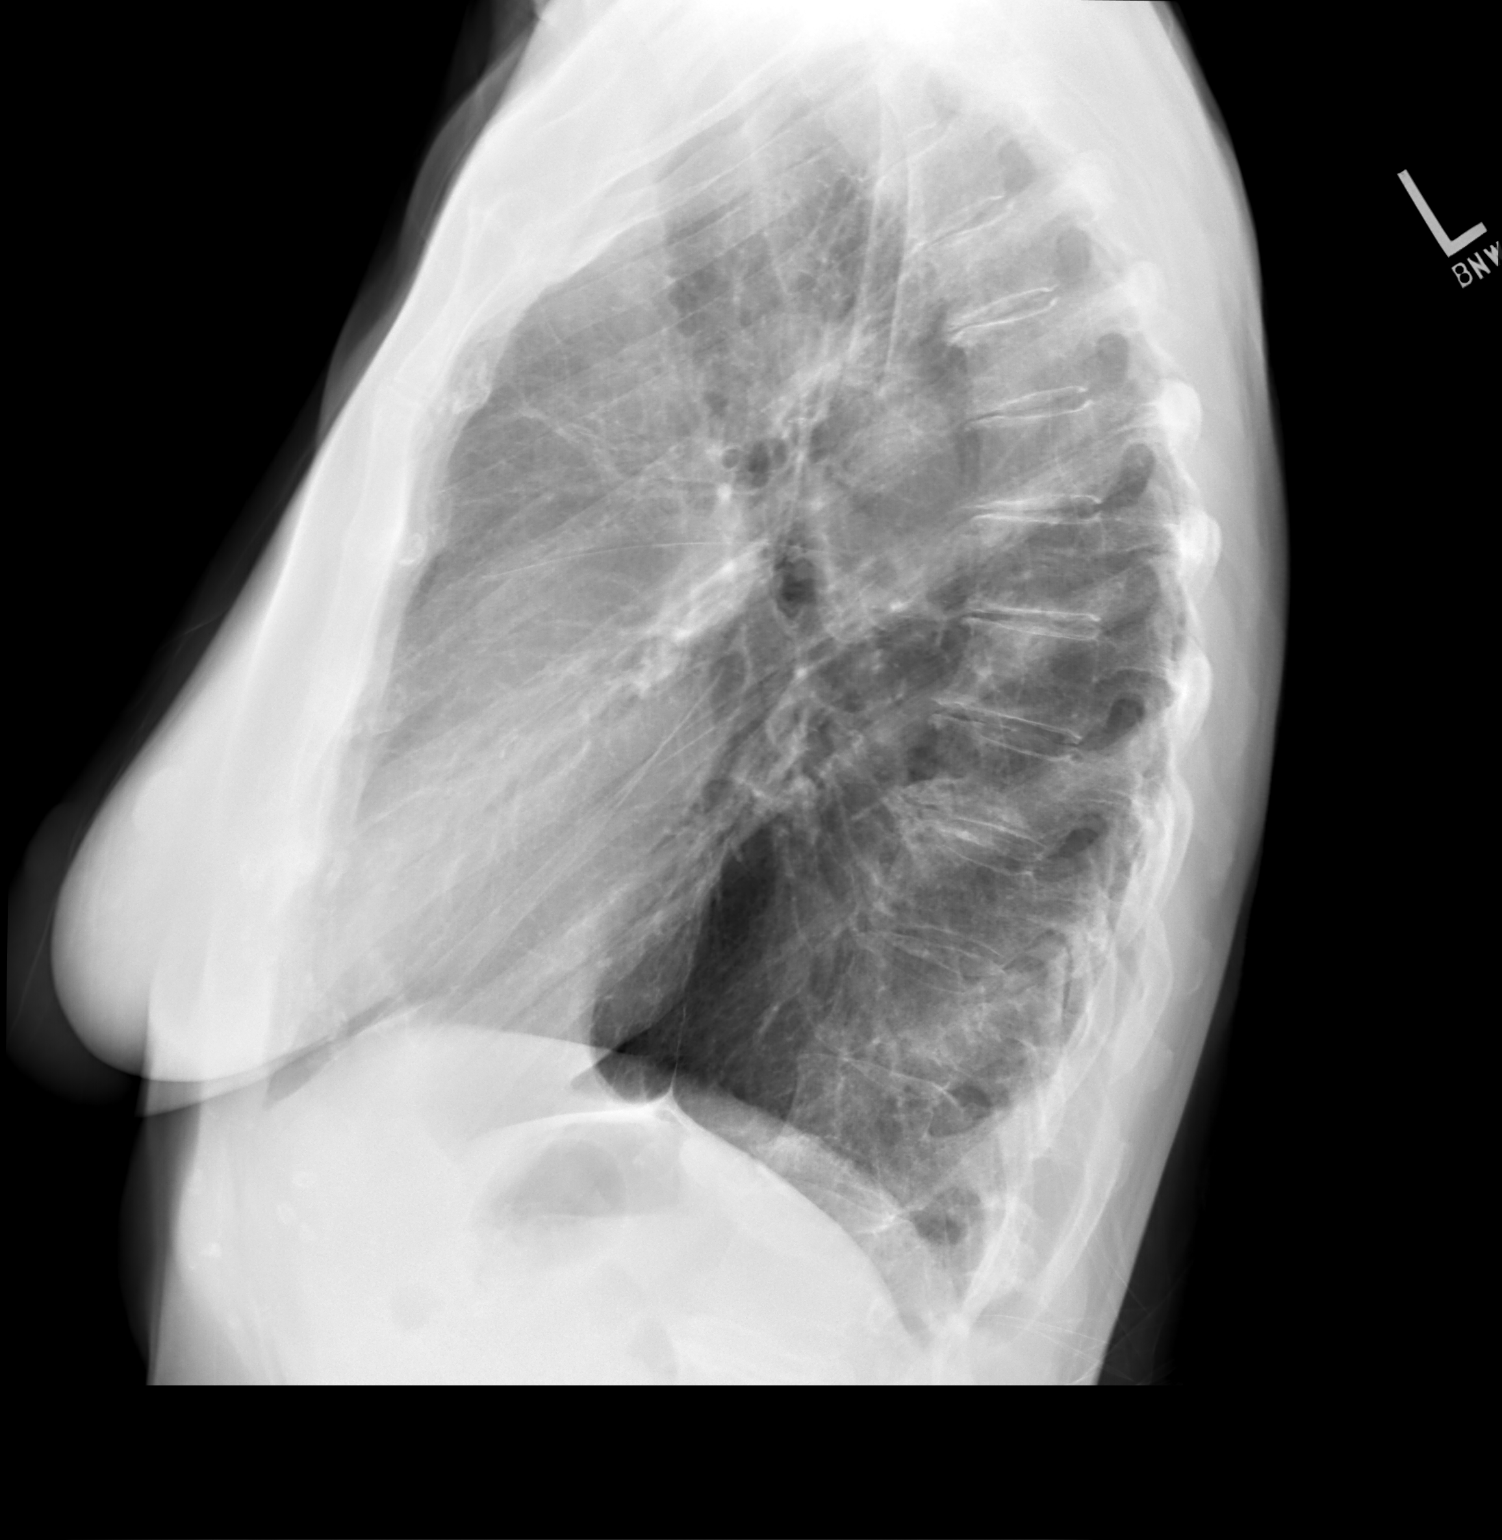

[2 of 2 positions shown; findings below may reference images not displayed]

FINDINGS: No active infiltrate or effusion is seen. Mediastinal and hilar
contours are unremarkable. The heart is within normal limits in
size. There are mild degenerative changes in the lower thoracic
spine.
IMPRESSION: No active cardiopulmonary disease.

## 2019-05-19 MED ORDER — AMLODIPINE BESYLATE 5 MG PO TABS: 5.0000 mg | ORAL_TABLET | Freq: Every day | ORAL | 3 refills | Status: DC

## 2019-06-01 ENCOUNTER — Encounter: Payer: Self-pay | Admitting: Internal Medicine

## 2019-06-01 ENCOUNTER — Other Ambulatory Visit: Payer: Self-pay

## 2019-06-01 ENCOUNTER — Ambulatory Visit (INDEPENDENT_AMBULATORY_CARE_PROVIDER_SITE_OTHER): Payer: Medicare Other | Admitting: Internal Medicine

## 2019-06-01 ENCOUNTER — Telehealth: Payer: Self-pay

## 2019-06-01 VITALS — Ht 63.0 in | Wt 120.0 lb

## 2019-06-01 DIAGNOSIS — J0111 Acute recurrent frontal sinusitis: Secondary | ICD-10-CM

## 2019-06-01 MED ORDER — FLUTICASONE PROPIONATE 50 MCG/ACT NA SUSP: 2.0000 | Freq: Every day | NASAL | 2 refills | Status: DC

## 2019-06-01 MED ORDER — AZITHROMYCIN 250 MG PO TABS: ORAL_TABLET | ORAL | 0 refills | Status: DC

## 2019-06-01 NOTE — Progress Notes (Signed)
Virtual Visit via Video Note  I connected with Lynn Powell   on 06/01/19 at  2:400 PM EDT by a video enabled telemedicine application and verified that I am speaking with the correct person using two identifiers.  Location patient: home Location provider:work or home office Persons participating in the virtual visit: patient, provider  I discussed the limitations of evaluation and management by telemedicine and the availability of in person appointments. The patient expressed understanding and agreed to proceed.   HPI: 1. C/o sinus pain frontal and right nasal x 1 week she has had reduced sleep, increased stress and sugar which she reports is normal trigger. Her reports no sore throat. The glands in her neck are enlarged. No fever temp 98.6 today. No cough but has some PND with white sputum.  She typically does not have allergies (I.e cough, sneezing, running eyes). She has not tried anything but nasal saline. No exposure to covid 19    ROS: See pertinent positives and negatives per HPI.  Past Medical History:  Diagnosis Date  . Anemia    kid  . Cataract   . Constipation   . COPD (chronic obstructive pulmonary disease) (HCC)   . Frequent headaches   . Glaucoma   . Glaucoma   . Hyperlipidemia   . Hypertension   . Pneumonia    11/2017   . Postnasal drip     Past Surgical History:  Procedure Laterality Date  . ABDOMINAL HYSTERECTOMY    . TONSILLECTOMY      Family History  Problem Relation Age of Onset  . Heart disease Mother   . Heart murmur Mother        originated from typhoid disease  . Stroke Father     SOCIAL HX: lives with family    Current Outpatient Medications:  .  amLODipine (NORVASC) 5 MG tablet, Take 1 tablet (5 mg total) by mouth daily., Disp: 90 tablet, Rfl: 3 .  estradiol (ESTRACE) 0.1 MG/GM vaginal cream, Place 1 Applicatorful vaginally at bedtime., Disp: , Rfl:  .  progesterone (PROMETRIUM) 200 MG capsule, TAKE ONE CAPSULE BY MOUTH DAILY AT  BEDTIME, Disp: , Rfl: 0 .  azithromycin (ZITHROMAX) 250 MG tablet, 2 pills day 1 and 1 pill day 2-5, Disp: 6 tablet, Rfl: 0 .  fluticasone (FLONASE) 50 MCG/ACT nasal spray, Place 2 sprays into both nostrils daily. Prn nasal congestion, Disp: 16 g, Rfl: 2  EXAM:  VITALS per patient if applicable:  GENERAL: alert, oriented, appears well and in no acute distress  HEENT: atraumatic, conjunttiva clear, no obvious abnormalities on inspection of external nose and ears  NECK: normal movements of the head and neck  LUNGS: on inspection no signs of respiratory distress, breathing rate appears normal, no obvious gross SOB, gasping or wheezing  CV: no obvious cyanosis  MS: moves all visible extremities without noticeable abnormality  PSYCH/NEURO: pleasant and cooperative, no obvious depression or anxiety, speech and thought processing grossly intact  ASSESSMENT AND PLAN:  Discussed the following assessment and plan:  Acute recurrent frontal sinusitis - Plan: azithromycin (ZITHROMAX) 250 MG tablet, fluticasone (FLONASE) 50 MCG/ACT nasal spray, NS before flonase  otc allergy prn  Call back Friday if not better       I discussed the assessment and treatment plan with the patient. The patient was provided an opportunity to ask questions and all were answered. The patient agreed with the plan and demonstrated an understanding of the instructions.   The patient was advised to  call back or seek an in-person evaluation if the symptoms worsen or if the condition fails to improve as anticipated.  Time spent 15 minutes  Delorise Jackson, MD

## 2019-06-01 NOTE — Patient Instructions (Signed)
Sinusitis, Adult Sinusitis is inflammation of your sinuses. Sinuses are hollow spaces in the bones around your face. Your sinuses are located:  Around your eyes.  In the middle of your forehead.  Behind your nose.  In your cheekbones. Mucus normally drains out of your sinuses. When your nasal tissues become inflamed or swollen, mucus can become trapped or blocked. This allows bacteria, viruses, and fungi to grow, which leads to infection. Most infections of the sinuses are caused by a virus. Sinusitis can develop quickly. It can last for up to 4 weeks (acute) or for more than 12 weeks (chronic). Sinusitis often develops after a cold. What are the causes? This condition is caused by anything that creates swelling in the sinuses or stops mucus from draining. This includes:  Allergies.  Asthma.  Infection from bacteria or viruses.  Deformities or blockages in your nose or sinuses.  Abnormal growths in the nose (nasal polyps).  Pollutants, such as chemicals or irritants in the air.  Infection from fungi (rare). What increases the risk? You are more likely to develop this condition if you:  Have a weak body defense system (immune system).  Do a lot of swimming or diving.  Overuse nasal sprays.  Smoke. What are the signs or symptoms? The main symptoms of this condition are pain and a feeling of pressure around the affected sinuses. Other symptoms include:  Stuffy nose or congestion.  Thick drainage from your nose.  Swelling and warmth over the affected sinuses.  Headache.  Upper toothache.  A cough that may get worse at night.  Extra mucus that collects in the throat or the back of the nose (postnasal drip).  Decreased sense of smell and taste.  Fatigue.  A fever.  Sore throat.  Bad breath. How is this diagnosed? This condition is diagnosed based on:  Your symptoms.  Your medical history.  A physical exam.  Tests to find out if your condition is  acute or chronic. This may include: ? Checking your nose for nasal polyps. ? Viewing your sinuses using a device that has a light (endoscope). ? Testing for allergies or bacteria. ? Imaging tests, such as an MRI or CT scan. In rare cases, a bone biopsy may be done to rule out more serious types of fungal sinus disease. How is this treated? Treatment for sinusitis depends on the cause and whether your condition is chronic or acute.  If caused by a virus, your symptoms should go away on their own within 10 days. You may be given medicines to relieve symptoms. They include: ? Medicines that shrink swollen nasal passages (topical intranasal decongestants). ? Medicines that treat allergies (antihistamines). ? A spray that eases inflammation of the nostrils (topical intranasal corticosteroids). ? Rinses that help get rid of thick mucus in your nose (nasal saline washes).  If caused by bacteria, your health care provider may recommend waiting to see if your symptoms improve. Most bacterial infections will get better without antibiotic medicine. You may be given antibiotics if you have: ? A severe infection. ? A weak immune system.  If caused by narrow nasal passages or nasal polyps, you may need to have surgery. Follow these instructions at home: Medicines  Take, use, or apply over-the-counter and prescription medicines only as told by your health care provider. These may include nasal sprays.  If you were prescribed an antibiotic medicine, take it as told by your health care provider. Do not stop taking the antibiotic even if you start   to feel better. Hydrate and humidify   Drink enough fluid to keep your urine pale yellow. Staying hydrated will help to thin your mucus.  Use a cool mist humidifier to keep the humidity level in your home above 50%.  Inhale steam for 10-15 minutes, 3-4 times a day, or as told by your health care provider. You can do this in the bathroom while a hot shower is  running.  Limit your exposure to cool or dry air. Rest  Rest as much as possible.  Sleep with your head raised (elevated).  Make sure you get enough sleep each night. General instructions   Apply a warm, moist washcloth to your face 3-4 times a day or as told by your health care provider. This will help with discomfort.  Wash your hands often with soap and water to reduce your exposure to germs. If soap and water are not available, use hand sanitizer.  Do not smoke. Avoid being around people who are smoking (secondhand smoke).  Keep all follow-up visits as told by your health care provider. This is important. Contact a health care provider if:  You have a fever.  Your symptoms get worse.  Your symptoms do not improve within 10 days. Get help right away if:  You have a severe headache.  You have persistent vomiting.  You have severe pain or swelling around your face or eyes.  You have vision problems.  You develop confusion.  Your neck is stiff.  You have trouble breathing. Summary  Sinusitis is soreness and inflammation of your sinuses. Sinuses are hollow spaces in the bones around your face.  This condition is caused by nasal tissues that become inflamed or swollen. The swelling traps or blocks the flow of mucus. This allows bacteria, viruses, and fungi to grow, which leads to infection.  If you were prescribed an antibiotic medicine, take it as told by your health care provider. Do not stop taking the antibiotic even if you start to feel better.  Keep all follow-up visits as told by your health care provider. This is important. This information is not intended to replace advice given to you by your health care provider. Make sure you discuss any questions you have with your health care provider. Document Released: 10/14/2005 Document Revised: 03/16/2018 Document Reviewed: 03/16/2018 Elsevier Patient Education  2020 [employer]  

## 2019-06-01 NOTE — Telephone Encounter (Signed)
Copied from [place] [id number]. Topic: General - Inquiry >> Jun 01, 2019  1:34 PM Rainey Pines A wrote: Patients feels she may have a sinus infection and would like to speak with nurse in regards to antibiotics.

## 2019-06-01 NOTE — Telephone Encounter (Signed)
Called and spoke to pt.  Pt thinks that she may have a sinus infection and said that she gets them periodically.   Pt said that she is having extreme pain and pressure on right side of face and under eye and congestion.    Pt scheduled a virtual visit w/ Dr. Aundra Dubin today @ 2:30 pm.

## 2019-06-04 NOTE — Telephone Encounter (Signed)
Patient calling to update PCP that she is feeling a lot better.

## 2019-07-13 ENCOUNTER — Other Ambulatory Visit: Payer: Self-pay

## 2019-07-13 ENCOUNTER — Ambulatory Visit (INDEPENDENT_AMBULATORY_CARE_PROVIDER_SITE_OTHER): Payer: Medicare Other

## 2019-07-13 DIAGNOSIS — Z0000 Encounter for general adult medical examination without abnormal findings: Secondary | ICD-10-CM

## 2019-07-13 NOTE — Progress Notes (Signed)
Subjective:   Lynn Powell CommentCheryl Powell is a 78 y.o. female who presents for Medicare Annual (Subsequent) preventive examination.  Review of Systems:  No ROS.  Medicare Wellness Virtual Visit.  Visual/audio telehealth visit, UTA vital signs.   See social history for additional risk factors.   Cardiac Risk Factors include: advanced age (>5255men, 48>65 women);hypertension     Objective:     Vitals: There were no vitals taken for this visit.  There is no height or weight on file to calculate BMI.  Advanced Directives 07/13/2019 07/01/2018  Does Patient Have a Medical Advance Directive? No No  Does patient want to make changes to medical advance directive? - Yes (MAU/Ambulatory/Procedural Areas - Information given)  Would patient like information on creating a medical advance directive? No - Patient declined -    Tobacco Social History   Tobacco Use  Smoking Status Never Smoker  Smokeless Tobacco Never Used     Counseling given: Not Answered   Clinical Intake:  Pre-visit preparation completed: Yes        Diabetes: No  How often do you need to have someone help you when you read instructions, pamphlets, or other written materials from your doctor or pharmacy?: 1 - Never  Interpreter Needed?: No     Past Medical History:  Diagnosis Date  . Anemia    kid  . Cataract   . Constipation   . COPD (chronic obstructive pulmonary disease) (HCC)   . Frequent headaches   . Glaucoma   . Glaucoma   . Hyperlipidemia   . Hypertension   . Pneumonia    11/2017   . Postnasal drip    Past Surgical History:  Procedure Laterality Date  . ABDOMINAL HYSTERECTOMY    . TONSILLECTOMY     Family History  Problem Relation Age of Onset  . Heart disease Mother   . Heart murmur Mother        originated from typhoid disease  . Stroke Mother   . Stroke Father    Social History   Socioeconomic History  . Marital status: Widowed    Spouse name: Not on file  . Number of children: Not on  file  . Years of education: Not on file  . Highest education level: Not on file  Occupational History  . Not on file  Social Needs  . Financial resource strain: Not hard at all  . Food insecurity    Worry: Never true    Inability: Never true  . Transportation needs    Medical: No    Non-medical: No  Tobacco Use  . Smoking status: Never Smoker  . Smokeless tobacco: Never Used  Substance and Sexual Activity  . Alcohol use: No  . Drug use: No  . Sexual activity: Never    Partners: Male  Lifestyle  . Physical activity    Days per week: 3 days    Minutes per session: 20 min  . Stress: Not at all  Relationships  . Social Musicianconnections    Talks on phone: Not on file    Gets together: Not on file    Attends religious service: Not on file    Active member of club or organization: Not on file    Attends meetings of clubs or organizations: Not on file    Relationship status: Widowed  Other Topics Concern  . 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 amLODipine (NORVASC) 5 MG tablet Take 1 tablet (5 mg total) by mouth daily.  Marland Kitchen estradiol (ESTRACE) 0.1 MG/GM vaginal cream Place 1 Applicatorful vaginally at bedtime.  . fluticasone (FLONASE) 50 MCG/ACT nasal spray Place 2 sprays into both nostrils daily. Prn nasal congestion  . progesterone (PROMETRIUM) 200 MG capsule TAKE ONE CAPSULE BY MOUTH DAILY AT BEDTIME  . [DISCONTINUED] azithromycin (ZITHROMAX) 250 MG tablet 2 pills day 1 and 1 pill day 2-5   No facility-administered encounter medications on file as of 07/13/2019.     Activities of Daily Living In your present state of health, do you have any difficulty performing the following activities: 07/13/2019  Hearing? N  Vision? N  Difficulty concentrating or making decisions? N  Walking or climbing stairs?  N  Dressing or bathing? N  Doing errands, shopping? N  Preparing Food and eating ? N  Using the Toilet? N  In the past six months, have you accidently leaked urine? N  Do you have problems with loss of bowel control? N  Managing your Medications? N  Managing your Finances? N  Housekeeping or managing your Housekeeping? N  Some recent data might be hidden    Patient Care Team: McLean-Scocuzza, Nino Glow, MD as PCP - General (Internal Medicine)    Assessment:   This is a routine wellness examination for [place].  I connected with patient 07/13/19 at 12:00 PM EDT by an audio enabled telemedicine application and verified that I am speaking with the correct person using two identifiers. Patient stated full name and DOB. Patient gave permission to continue with virtual visit. Patient's location was at home and Nurse's location was at [place] office.   Health Maintenance Due: PNA, Tdap, Shingles - discussed; to be completed with doctor in visit or local pharmacy.  Dexa Scan- she plans to complete with her doctor in [place]. See completed HM at the end of note.   Eye: Visual acuity not assessed. Virtual visit. Wears corrective lenses. Followed by their ophthalmologist every 12 months.  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  Dental: UTD  Hearing: Demonstrates normal hearing during visit.  Safety:  Patient feels safe at home- yes Patient does have smoke detectors at home- yes Patient does wear sunscreen or protective clothing when in direct sunlight - yes Patient does wear seat belt when in a moving vehicle - yes Patient drives- yes Adequate lighting in walkways free from debris- yes Grab bars and handrails used as appropriate- yes Ambulates with no assistive device Cell phone or lifeline/life alert/medic alert on person when ambulating outside of the home- yes  Social: Alcohol intake - no    Smoking history- never   Smokers in home? none Illicit drug use? none   Depression: PHQ 2 &9 complete. See screening below. Denies irritability, anhedonia, sadness/tearfullness.  Stable.   Falls: See screening below.    Medication: Taking as directed and without issues.   Covid-19: Precautions and sickness symptoms discussed. Wears mask, social distancing, hand hygiene as appropriate.   Activities of Daily Living Patient denies needing assistance with: household chores, feeding themselves, getting from bed to chair, getting to the toilet, bathing/showering, dressing, managing money, or preparing meals.   Memory: Patient is alert. Patient denies difficulty focusing or concentrating. Correctly identified the president of the [place], season and recall. Patient likes to read and write news letters on children's books  for brain stimulation.  BMI- discussed the importance of a healthy diet, water intake and the benefits of aerobic exercise.  Educational material provided.  Physical activity- walking 1/2 mile daily.  Diet:  Holistic diet Water: good intake Caffeine: rare Ensure/Protein supplement: no  Advanced Directive: End of life planning; Advanced aging; Advanced directives discussed.  No HCPOA/Living Will.  Additional information declined at this time.  Other Providers Patient Care Team: McLean-Scocuzza, Pasty Spillers, MD as PCP - General (Internal Medicine)  Exercise Activities and Dietary recommendations Current Exercise Habits: Home exercise routine, Type of exercise: walking, Time (Minutes): 40, Frequency (Times/Week): 5, Weekly Exercise (Minutes/Week): 200, Intensity: Mild  Goals    . Reduce sugar intake       Fall Risk Fall Risk  06/01/2019 07/01/2018 07/01/2018 02/02/2018 12/01/2017  Falls in the past year? 1 Yes Yes No No  Number falls in past yr: 0 1 1 - -  Injury with Fall? 0 No No - -  Comment - - she tripped - -  Follow up - - Falls prevention discussed - -   Timed Get Up and Go performed: no, virtual visit  Depression Screen PHQ 2/9  Scores 07/13/2019 06/01/2019 07/01/2018 07/01/2018  PHQ - 2 Score 0 0 0 0     Cognitive Function MMSE - Mini Mental State Exam 07/01/2018  Orientation to time 5  Orientation to Place 5  Registration 3  Attention/ Calculation 5  Recall 3  Language- name 2 objects 2  Language- repeat 1  Language- follow 3 step command 3  Language- read & follow direction 1  Write a sentence 1  Copy design 1  Total score 30     6CIT Screen 07/13/2019  What Year? 0 points  What month? 0 points  What time? 0 points  Count back from 20 0 points  Months in reverse 0 points  Repeat phrase 0 points  Total Score 0    Immunization History  Administered Date(s) Administered  . Pneumococcal Conjugate-13 02/02/2018   Screening Tests Health Maintenance  Topic Date Due  . TETANUS/TDAP  12/17/1960  . DEXA SCAN  12/17/2006  . PNA vac Low Risk Adult (2 of 2 - PPSV23) 02/03/2019  . INFLUENZA VACCINE  01/26/2020 (Originally 05/29/2019)      Plan:    Keep all routine maintenance appointments.   6 month follow up 09/14/19  Medicare Attestation I have personally reviewed: The patient's medical and social history Their use of alcohol, tobacco or illicit drugs Their current medications and supplements The patient's functional ability including ADLs,fall risks, home safety risks, cognitive, and hearing and visual impairment Diet and physical activities Evidence for depression   In addition, I have reviewed and discussed with patient certain preventive protocols, quality metrics, and best practice recommendations. A written personalized care plan for preventive services as well as general preventive health recommendations were provided to patient via mail.     Ashok Pall, LPN  03/25/4131

## 2019-07-13 NOTE — Patient Instructions (Addendum)
  Ms. Lynn Powell , Thank you for taking time to come for your Medicare Wellness Visit. I appreciate your ongoing commitment to your health goals. Please review the following plan we discussed and let me know if I can assist you in the future.   These are the goals we discussed: Goals    . Reduce sugar intake       This is a list of the screening recommended for you and due dates:  Health Maintenance  Topic Date Due  . Tetanus Vaccine  12/17/1960  . DEXA scan (bone density measurement)  12/17/2006  . Pneumonia vaccines (2 of 2 - PPSV23) 02/03/2019  . Flu Shot  01/26/2020*  *Topic was postponed. The date shown is not the original due date.

## 2019-07-22 ENCOUNTER — Telehealth: Payer: Self-pay

## 2019-07-22 NOTE — Telephone Encounter (Signed)
Thank you Butch Penny. Patient will drop a packet for me at the front office regarding labs with attention to me.

## 2019-07-22 NOTE — Telephone Encounter (Signed)
Copied from [place] [id number]. Topic: General - Other >> Jul 22, 2019  1:33 PM Rainey Pines A wrote: Patient would like to speak with Langley Gauss in regards to some lab work.

## 2019-07-29 ENCOUNTER — Telehealth: Payer: Self-pay | Admitting: Internal Medicine

## 2019-07-29 NOTE — Telephone Encounter (Signed)
Received lab orders from [facility] from another physician for pt advise pt   We can only do basic labs here and if other labs needed please have her ask ordering doctor to order to [facility]   Mail copy of labs to patient and inform pt   [place]

## 2019-07-29 NOTE — Telephone Encounter (Signed)
Patient was informed.  Patient understood and no questions, comments, or concerns at this time.  

## 2019-08-11 ENCOUNTER — Ambulatory Visit: Payer: Self-pay

## 2019-08-11 ENCOUNTER — Emergency Department
Admission: EM | Admit: 2019-08-11 | Discharge: 2019-08-11 | Disposition: A | Discharge status: Home / Self Care | Payer: Medicare Other | Attending: Emergency Medicine | Admitting: Emergency Medicine

## 2019-08-11 ENCOUNTER — Other Ambulatory Visit: Payer: Self-pay

## 2019-08-11 DIAGNOSIS — J449 Chronic obstructive pulmonary disease, unspecified: Secondary | ICD-10-CM | POA: Insufficient documentation

## 2019-08-11 DIAGNOSIS — Z79899 Other long term (current) drug therapy: Secondary | ICD-10-CM | POA: Insufficient documentation

## 2019-08-11 DIAGNOSIS — R531 Weakness: Secondary | ICD-10-CM | POA: Diagnosis present

## 2019-08-11 DIAGNOSIS — I10 Essential (primary) hypertension: Secondary | ICD-10-CM | POA: Diagnosis not present

## 2019-08-11 DIAGNOSIS — M79602 Pain in left arm: Secondary | ICD-10-CM | POA: Diagnosis not present

## 2019-08-11 LAB — HEPATIC FUNCTION PANEL
ALT: 17 U/L (ref 0–44)
AST: 22 U/L (ref 15–41)
Albumin: 3.8 g/dL (ref 3.5–5.0)
Alkaline Phosphatase: 58 U/L (ref 38–126)
Bilirubin, Direct: <0.1 mg/dL (ref 0.0–0.2)
Total Bilirubin: 0.6 mg/dL (ref 0.3–1.2)
Total Protein: 7.2 g/dL (ref 6.5–8.1)

## 2019-08-11 LAB — BASIC METABOLIC PANEL
Anion gap: 10 (ref 5–15)
BUN: 17 mg/dL (ref 8–23)
CO2: 28 mmol/L (ref 22–32)
Calcium: 9.6 mg/dL (ref 8.9–10.3)
Chloride: 102 mmol/L (ref 98–111)
Creatinine, Ser: 0.89 mg/dL (ref 0.44–1.00)
GFR calc Af Amer: >60 mL/min (ref >60)
GFR calc non Af Amer: >60 mL/min (ref >60)
Glucose, Bld: 109 mg/dL — ABNORMAL HIGH (ref 70–99)
Potassium: 3.5 mmol/L (ref 3.5–5.1)
Sodium: 140 mmol/L (ref 135–145)

## 2019-08-11 LAB — URINALYSIS, COMPLETE (UACMP) WITH MICROSCOPIC
Bacteria, UA: NONE SEEN
Bilirubin Urine: NEGATIVE
Glucose, UA: NEGATIVE mg/dL
Hgb urine dipstick: NEGATIVE
Ketones, ur: NEGATIVE mg/dL
Leukocytes,Ua: NEGATIVE
Nitrite: NEGATIVE
Protein, ur: NEGATIVE mg/dL
Specific Gravity, Urine: 1.002 — ABNORMAL LOW (ref 1.005–1.030)
WBC, UA: NONE SEEN WBC/hpf (ref 0–5)
pH: 7 (ref 5.0–8.0)

## 2019-08-11 LAB — CBC
HCT: 38.1 % (ref 36.0–46.0)
Hemoglobin: 12.5 g/dL (ref 12.0–15.0)
MCH: 30.9 pg (ref 26.0–34.0)
MCHC: 32.8 g/dL (ref 30.0–36.0)
MCV: 94.3 fL (ref 80.0–100.0)
Platelets: 240 10*3/uL (ref 150–400)
RBC: 4.04 MIL/uL (ref 3.87–5.11)
RDW: 13.7 % (ref 11.5–15.5)
WBC: 5.4 10*3/uL (ref 4.0–10.5)
nRBC: 0 % (ref 0.0–0.2)

## 2019-08-11 LAB — TROPONIN I (HIGH SENSITIVITY)
Troponin I (High Sensitivity): 3 ng/L (ref <18)
Troponin I (High Sensitivity): 4 ng/L (ref <18)

## 2019-08-11 NOTE — ED Triage Notes (Signed)
Reports generalized weakness today and left arm pain. Pt states that she has hx of the same left arm pain when she is under a lot of stress. Reports continued fatigue throughout the day. Pt alert and oriented X4, cooperative, RR even and unlabored, color WNL. Pt in NAD.

## 2019-08-11 NOTE — Telephone Encounter (Signed)
Patient called stating that she has had left arm pain for about 45 minutes. She denies injury to the arm and states that she thinks it may be heart related.  She has a lot of stressor which she did not elaborate. She states that she feels weak.  The pain worsens when she is active but not when just moving her arm. She denies SOB.  She states that her hands are clammy but she is not sweaty or nauseated. Per protocol patient will go to ER for evaluation. She states that her daughter-in law will drive her.  Care advice read to patient. Patient verbalized understanding. Note will be routed to office.  Reason for Disposition . Pain also in shoulder(s) or arm(s) or jaw  Answer Assessment - Initial Assessment Questions 1. LOCATION: "Where does it hurt?"       Left arm 2. RADIATION: "Does the pain go anywhere else?" (e.g., into neck, jaw, arms, back)     no 3. ONSET: "When did the chest pain begin?" (Minutes, hours or days)     45 minutes 4. PATTERN "Does the pain come and go, or has it been constant since it started?"  "Does it get worse with exertion?"      Worse with activity 5. DURATION: "How long does it last" (e.g., seconds, minutes, hours)    45 minutes 6. SEVERITY: "How bad is the pain?"  (e.g., Scale 1-10; mild, moderate, or severe)    - MILD (1-3): doesn't interfere with normal activities     - MODERATE (4-7): interferes with normal activities or awakens from sleep    - SEVERE (8-10): excruciating pain, unable to do any normal activities       4-5 7. CARDIAC RISK FACTORS: "Do you have any history of heart problems or risk factors for heart disease?" (e.g., angina, prior heart attack; diabetes, high blood pressure, high cholesterol, smoker, or strong family history of heart disease)    HTN, mother from weak heart, father Heart 8. PULMONARY RISK FACTORS: "Do you have any history of lung disease?"  (e.g., blood clots in lung, asthma, emphysema, birth control pills)    no 9. CAUSE: "What do  you think is causing the chest pain?"    unsure 10. OTHER SYMPTOMS: "Do you have any other symptoms?" (e.g., dizziness, nausea, vomiting, sweating, fever, difficulty breathing, cough)      Weakness, clammy hands 11. PREGNANCY: "Is there any chance you are pregnant?" "When was your last menstrual period?"      N/A  Protocols used: CHEST PAIN-A-AH

## 2019-08-11 NOTE — Discharge Instructions (Addendum)
Please give Dr. Clayborn Bigness, the cardiologist, a call first thing in the morning about 8 or 9:00.  Let him know that you were in the emergency room with left arm pain and extreme tiredness.  They should be out of see you very quickly.  Please return here if you have any further problems with your arm pain and tiredness.  I would like you to return at once by ambulance if this happens.  In other words I want you to call 911.  Please take a baby aspirin every day from now until Dr. Clayborn Bigness sees you.  I have given you the instructions for the nonspecific chest pain.  Negative and really have chest pain but these are the instructions that I feel are most appropriate for you.

## 2019-08-11 NOTE — Telephone Encounter (Signed)
Agree with ED visit   TMS 

## 2019-08-11 NOTE — ED Provider Notes (Signed)
[facility] Emergency Department Provider Note   ____________________________________________   First MD Initiated Contact with Patient 08/11/19 1640     (approximate)  I have reviewed the triage vital signs and the nursing notes.   HISTORY  Chief Complaint Weakness and Arm Pain   HPI Lynn Powell is a 78 y.o. female who reports she has had intermittent left arm pain occasionally in the past but it always went away by itself.  Today the pain came on and made her feel very weak and tired.  This lasted for an hour which is never happened before.  Had been 1 away when she laid down took some aspirin and came back when she got up again but went away completely by the time she got here.   Pain was kind of achy.         Past Medical History:  Diagnosis Date  . Anemia    kid  . Cataract   . Constipation   . COPD (chronic obstructive pulmonary disease) (HCC)   . Frequent headaches   . Glaucoma   . Glaucoma   . Hyperlipidemia   . Hypertension   . Pneumonia    11/2017   . Postnasal drip     Patient Active Problem List   Diagnosis Date Noted  . Hyperlipidemia 07/01/2018  . Constipation 02/02/2018  . Allergic rhinitis 01/02/2018  . Postnasal drip 01/02/2018  . Skin lesion 07/01/2017  . Essential hypertension 12/05/2016    Past Surgical History:  Procedure Laterality Date  . ABDOMINAL HYSTERECTOMY    . TONSILLECTOMY      Prior to Admission medications   Medication Sig Start Date End Date Taking? Authorizing Provider  amLODipine (NORVASC) 5 MG tablet Take 1 tablet (5 mg total) by mouth daily. 05/19/19   McLean-Scocuzza, Pasty Spillers, MD  estradiol (ESTRACE) 0.1 MG/GM vaginal cream Place 1 Applicatorful vaginally at bedtime.    [provider]  fluticasone (FLONASE) 50 MCG/ACT nasal spray Place 2 sprays into both nostrils daily. Prn nasal congestion 06/01/19   McLean-Scocuzza, Pasty Spillers, MD  progesterone (PROMETRIUM) 200 MG capsule TAKE ONE  CAPSULE BY MOUTH DAILY AT BEDTIME 11/22/16   [provider]    Allergies Patient has no known allergies.  Family History  Problem Relation Age of Onset  . Heart disease Mother   . Heart murmur Mother        originated from typhoid disease  . Stroke Mother   . Stroke Father     Social History Social History   Tobacco Use  . Smoking status: Never Smoker  . Smokeless tobacco: Never Used  Substance Use Topics  . Alcohol use: No  . Drug use: No    Review of Systems  Constitutional: No fever/chills Eyes: No visual changes. ENT: No sore throat. Cardiovascular: Denies chest pain. Respiratory: Denies shortness of breath. Gastrointestinal: No abdominal pain.  No nausea, no vomiting.  No diarrhea.  No constipation. Genitourinary: Negative for dysuria. Musculoskeletal: Negative for back pain. Skin: Negative for rash. Neurological: Negative for headaches, focal weakness   ____________________________________________   PHYSICAL EXAM:  VITAL SIGNS: ED Triage Vitals  Enc Vitals Group     BP 08/11/19 1606 (!) 166/80     Pulse Rate 08/11/19 1606 89     Resp 08/11/19 1606 16     Temp 08/11/19 1606 98.4 F (36.9 C)     Temp Source 08/11/19 1606 Oral     SpO2 08/11/19 1606 100 %  Weight 08/11/19 1607 120 lb (54.4 kg)     Height 08/11/19 1607 5\' 3"  (1.6 m)     Head Circumference --      Peak Flow --      Pain Score 08/11/19 1607 0     Pain Loc --      Pain Edu? --      Excl. in [place]? --    Constitutional: Alert and oriented. Well appearing and in no acute distress. Eyes: Conjunctivae are normal. Head: Atraumatic. Nose: No congestion/rhinnorhea. Mouth/Throat: Mucous membranes are moist.  Oropharynx non-erythematous. Neck: No stridor.   Cardiovascular: Normal rate, regular rhythm. Grossly normal heart sounds.  Good peripheral circulation. Respiratory: Normal respiratory effort.  No retractions. Lungs CTAB. Gastrointestinal: Soft and nontender. No distention.  No abdominal bruits. No CVA tenderness. Musculoskeletal: No lower extremity tenderness nor edema.  Neurologic:  Normal speech and language. No gross focal neurologic deficits are appreciated. No gait instability. Skin:  Skin is warm, dry and intact. No rash noted.   ____________________________________________   LABS (all labs ordered are listed, but only abnormal results are displayed)  Labs Reviewed  BASIC METABOLIC PANEL - Abnormal; Notable for the following components:      Result Value   Glucose, Bld 109 (*)    All other components within normal limits  URINALYSIS, COMPLETE (UACMP) WITH MICROSCOPIC - Abnormal; Notable for the following components:   Color, Urine YELLOW (*)    APPearance CLEAR (*)    Specific Gravity, Urine 1.002 (*)    All other components within normal limits  CBC  HEPATIC FUNCTION PANEL  CBG MONITORING, ED  TROPONIN I (HIGH SENSITIVITY)  TROPONIN I (HIGH SENSITIVITY)   ____________________________________________  EKG  EKG read interpreted by me shows normal sinus rhythm rate of 90 normal axis somewhat decreased R wave progression otherwise no acute changes ____________________________________________  RADIOLOGY  ED MD interpretation:    Official radiology report(s): No results found.  ____________________________________________   PROCEDURES  Procedure(s) performed (including Critical Care):  Procedures   ____________________________________________   INITIAL IMPRESSION / ASSESSMENT AND PLAN / ED COURSE  Patient's arm pain with the weakness is worrisome for an anginal equivalent however her EKG and troponins have been normal.  I will let her go and have her follow-up tomorrow with cardiology.  She will return here immediately if she is having any further problems.   Patient was offered admission but declined.  She understands what I am worried about.          ____________________________________________   FINAL CLINICAL  IMPRESSION(S) / ED DIAGNOSES  Final diagnoses:  Weakness  Left arm pain     ED Discharge Orders    None       Note:  This document was prepared using Dragon voice recognition software and may include unintentional dictation errors.    Nena Polio, MD 08/11/19 [phone]

## 2019-08-17 DIAGNOSIS — R079 Chest pain, unspecified: Secondary | ICD-10-CM | POA: Insufficient documentation

## 2019-08-17 DIAGNOSIS — R9431 Abnormal electrocardiogram [ECG] [EKG]: Secondary | ICD-10-CM | POA: Insufficient documentation

## 2019-08-25 ENCOUNTER — Other Ambulatory Visit: Payer: Self-pay | Admitting: Internal Medicine

## 2019-08-25 DIAGNOSIS — J0111 Acute recurrent frontal sinusitis: Secondary | ICD-10-CM

## 2019-08-25 MED ORDER — FLUTICASONE PROPIONATE 50 MCG/ACT NA SUSP: 2.0000 | Freq: Every day | NASAL | 11 refills | Status: DC

## 2019-09-13 ENCOUNTER — Other Ambulatory Visit: Payer: Self-pay

## 2019-09-14 ENCOUNTER — Encounter: Payer: Self-pay | Admitting: Internal Medicine

## 2019-09-14 ENCOUNTER — Ambulatory Visit (INDEPENDENT_AMBULATORY_CARE_PROVIDER_SITE_OTHER): Payer: Medicare Other | Admitting: Internal Medicine

## 2019-09-14 VITALS — BP 112/70 | HR 91 | Temp 97.3°F | Ht 63.0 in | Wt 115.8 lb

## 2019-09-14 DIAGNOSIS — M545 Low back pain, unspecified: Secondary | ICD-10-CM

## 2019-09-14 DIAGNOSIS — W19XXXD Unspecified fall, subsequent encounter: Secondary | ICD-10-CM

## 2019-09-14 DIAGNOSIS — L570 Actinic keratosis: Secondary | ICD-10-CM

## 2019-09-14 DIAGNOSIS — R634 Abnormal weight loss: Secondary | ICD-10-CM | POA: Diagnosis not present

## 2019-09-14 DIAGNOSIS — Z23 Encounter for immunization: Secondary | ICD-10-CM | POA: Diagnosis not present

## 2019-09-14 DIAGNOSIS — Z1283 Encounter for screening for malignant neoplasm of skin: Secondary | ICD-10-CM

## 2019-09-14 DIAGNOSIS — I10 Essential (primary) hypertension: Secondary | ICD-10-CM | POA: Diagnosis not present

## 2019-09-14 MED ORDER — CYCLOBENZAPRINE HCL 5 MG PO TABS: 5.0000 mg | ORAL_TABLET | Freq: Every evening | ORAL | 3 refills | Status: DC | PRN

## 2019-09-14 NOTE — Progress Notes (Signed)
Pre visit review using our clinic review tool, if applicable. No additional management support is needed unless otherwise documented below in the visit note. 

## 2019-09-14 NOTE — Progress Notes (Signed)
Chief Complaint  Patient presents with  . Follow-up   F/u  1. pna 23 vaccine due today  2. Fall 1.5 months ago no injury but after fall low back pain started and went to Dr. Marsh Dolly chiropractor w/o help and tried back brace which helped  3. ED visit 08/11/19 for weakness she was doing powder for energy stopped and felt weak with left arm pain trop neg x 2 in ED and left arm pain resolved after left ED she declines need for stress test at this time     Review of Systems  Constitutional: Positive for weight loss.       Down 5 lbs more active per pt    HENT: Negative for hearing loss.   Eyes: Negative for blurred vision.  Respiratory: Negative for cough and shortness of breath.   Cardiovascular: Negative for chest pain.  Gastrointestinal: Negative for abdominal pain and blood in stool.  Musculoskeletal: Positive for back pain and falls.  Skin: Negative for rash.  Neurological: Negative for headaches.  Psychiatric/Behavioral: Negative for depression.   Past Medical History:  Diagnosis Date  . Anemia    kid  . Cataract   . Constipation   . COPD (chronic obstructive pulmonary disease) ([place])   . Frequent headaches   . Glaucoma   . Glaucoma   . Hyperlipidemia   . Hypertension   . Pneumonia    11/2017   . Postnasal drip    Past Surgical History:  Procedure Laterality Date  . ABDOMINAL HYSTERECTOMY    . TONSILLECTOMY     Family History  Problem Relation Age of Onset  . Heart disease Mother   . Heart murmur Mother        originated from typhoid disease  . Stroke Mother   . Stroke Father    Social History   Socioeconomic History  . Marital status: Widowed    Spouse name: Not on file  . Number of children: Not on file  . Years of education: Not on file  . Highest education level: Not on file  Occupational History  . Not on file  Social Needs  . Financial resource strain: Not hard at all  . Food insecurity    Worry: Never true    Inability: Never true  .  Transportation needs    Medical: No    Non-medical: No  Tobacco Use  . Smoking status: Never Smoker  . Smokeless tobacco: Never Used  Substance and Sexual Activity  . Alcohol use: No  . Drug use: No  . Sexual activity: Never    Partners: Male  Lifestyle  . Physical activity    Days per week: 3 days    Minutes per session: 20 min  . Stress: Not at all  Relationships  . Social Herbalist on phone: Not on file    Gets together: Not on file    Attends religious service: Not on file    Active member of club or organization: Not on file    Attends meetings of clubs or organizations: Not on file    Relationship status: Widowed  . Intimate partner violence    Fear of current or ex partner: No    Emotionally abused: No    Physically abused: No    Forced sexual activity: No  Other Topics Concern  . 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 amLODipine (NORVASC) 5 MG tablet Take 1 tablet (5 mg total) by mouth daily.  Marland Kitchen estradiol (ESTRACE) 0.1 MG/GM vaginal cream Place 1 Applicatorful vaginally at bedtime.  . fluticasone (FLONASE) 50 MCG/ACT nasal spray Place 2 sprays into both nostrils daily. Prn nasal congestion  . progesterone (PROMETRIUM) 200 MG capsule TAKE ONE CAPSULE BY MOUTH DAILY AT BEDTIME   No Known Allergies Recent Results (from the past 2160 hour(s))  Basic metabolic panel     Status: Abnormal   Collection Time: 08/11/19  4:08 PM  Result Value Ref Range   Sodium 140 135 - 145 mmol/L   Potassium 3.5 3.5 - 5.1 mmol/L   Chloride 102 98 - 111 mmol/L   CO2 28 22 - 32 mmol/L   Glucose, Bld 109 (H) 70 - 99 mg/dL   BUN 17 8 - 23 mg/dL   Creatinine, Ser 2.54 0.44 - 1.00 mg/dL   Calcium 9.6 8.9 - 27.0 mg/dL   GFR calc non Af Amer >60 >60 mL/min   GFR calc Af Amer >60 >60 mL/min   Anion gap 10 5 - 15    Comment: Performed  at [facility], [address] [place], [place] [address]  CBC     Status: None   Collection Time: 08/11/19  4:08 PM  Result Value Ref Range   WBC 5.4 4.0 - 10.5 K/uL   RBC 4.04 3.87 - 5.11 MIL/uL   Hemoglobin 12.5 12.0 - 15.0 g/dL   HCT 28.3 15.1 - 76.1 %   MCV 94.3 80.0 - 100.0 fL   MCH 30.9 26.0 - 34.0 pg   MCHC 32.8 30.0 - 36.0 g/dL   RDW 60.7 37.1 - 06.2 %   Platelets 240 150 - 400 K/uL   nRBC 0.0 0.0 - 0.2 %    Comment: Performed at [facility], [address] [place], [place] [address]  Urinalysis, Complete w Microscopic     Status: Abnormal   Collection Time: 08/11/19  4:08 PM  Result Value Ref Range   Color, Urine YELLOW (A) YELLOW   APPearance CLEAR (A) CLEAR   Specific Gravity, Urine 1.002 (L) 1.005 - 1.030   pH 7.0 5.0 - 8.0   Glucose, UA NEGATIVE NEGATIVE mg/dL   Hgb urine dipstick NEGATIVE NEGATIVE   Bilirubin Urine NEGATIVE NEGATIVE   Ketones, ur NEGATIVE NEGATIVE mg/dL   Protein, ur NEGATIVE NEGATIVE mg/dL   Nitrite NEGATIVE NEGATIVE   Leukocytes,Ua NEGATIVE NEGATIVE   RBC / HPF 0-5 0 - 5 RBC/hpf   WBC, UA NONE SEEN 0 - 5 WBC/hpf   Bacteria, UA NONE SEEN NONE SEEN   Squamous Epithelial / LPF 0-5 0 - 5    Comment: Performed at [facility], [address] [place], [place] [address]  Hepatic function panel     Status: None   Collection Time: 08/11/19  4:08 PM  Result Value Ref Range   Total Protein 7.2 6.5 - 8.1 g/dL   Albumin 3.8 3.5 - 5.0 g/dL   AST 22 15 - 41 U/L   ALT 17 0 - 44 U/L   Alkaline Phosphatase 58 38 - 126 U/L   Total Bilirubin 0.6 0.3 - 1.2 mg/dL   Bilirubin, Direct <3.5 0.0 - 0.2 mg/dL   Indirect Bilirubin NOT CALCULATED 0.3 - 0.9 mg/dL    Comment: Performed at [facility], [address] [place], [place]  [id number]  Troponin I (High Sensitivity)     Status: None   Collection Time: 08/11/19  4:08 PM  Result Value Ref Range   Troponin I (High Sensitivity) 3 <18 ng/L    Comment:  (NOTE) Elevated high sensitivity troponin I (hsTnI) values and significant  changes across serial measurements may suggest ACS but many other  chronic and acute conditions are known to elevate hsTnI results.  Refer to the "Links" section for chest pain algorithms and additional  guidance. Performed at [facility], [address] [place], [place] [id number]   Troponin I (High Sensitivity)     Status: None   Collection Time: 08/11/19  6:01 PM  Result Value Ref Range   Troponin I (High Sensitivity) 4 <18 ng/L    Comment: (NOTE) Elevated high sensitivity troponin I (hsTnI) values and significant  changes across serial measurements may suggest ACS but many other  chronic and acute conditions are known to elevate hsTnI results.  Refer to the "Links" section for chest pain algorithms and additional  guidance. Performed at [facility], [address] [place], [place] [id number]    Objective  Body mass index is 20.51 kg/m. Wt Readings from Last 3 Encounters:  09/14/19 115 lb 12.8 oz (52.5 kg)  08/11/19 120 lb (54.4 kg)  06/01/19 120 lb (54.4 kg)   Temp Readings from Last 3 Encounters:  09/14/19 (!) 97.3 F (36.3 C) (Skin)  08/11/19 98.7 F (37.1 C) (Oral)  07/01/18 97.9 F (36.6 C) (Oral)   BP Readings from Last 3 Encounters:  09/14/19 112/70  08/11/19 129/79  07/01/18 128/68   Pulse Readings from Last 3 Encounters:  09/14/19 91  08/11/19 81  07/01/18 78    Physical Exam Vitals signs and nursing note reviewed.  Constitutional:      Appearance: Normal appearance. She is well-developed and well-groomed.     Comments: +mask on    HENT:     Head: Normocephalic and atraumatic.  Eyes:     Conjunctiva/sclera: Conjunctivae normal.     Pupils: Pupils are equal, round, and reactive to light.  Cardiovascular:     Rate and Rhythm: Normal rate and regular rhythm.     Heart sounds: Normal heart sounds. No murmur.  Pulmonary:     Effort: Pulmonary effort  is normal.     Breath sounds: Normal breath sounds.  Abdominal:     General: Abdomen is flat. Bowel sounds are normal.     Tenderness: There is no abdominal tenderness.  Skin:    General: Skin is warm and dry.  Neurological:     General: No focal deficit present.     Mental Status: She is alert and oriented to person, place, and time. Mental status is at baseline.     Gait: Gait normal.  Psychiatric:        Attention and Perception: Attention and perception normal.        Mood and Affect: Mood and affect normal.        Speech: Speech normal.        Behavior: Behavior normal. Behavior is cooperative.        Thought Content: Thought content normal.        Cognition and Memory: Cognition and memory normal.        Judgment: Judgment normal.     Assessment  Plan  Low back pain without sciatica, unspecified back pain laterality, unspecified chronicity - Plan: cyclobenzaprine (FLEXERIL) 5 MG tablet qhs prn  Will  Xray in future if flares Stretches given  Weight loss pt reports wt loss over years trying  Declines w/u for now  Fall, subsequent encounter with back pain no other injury slipped on pine straq   HTN controlled cont meds norvasc 5 mg qd   HM sch  Fasting labs and have labs from [place] [facility] faxed to me  Call back with name of eye doctor   Declines flu shot prevnar utd, pna 23 vaccine given today Tdap had ? Datecheck date  shingrix disc today given Rxon back order check again advised 09/14/19   No pap s/p hysterectomy DUB rec mammo need records [place] declines as of 09/14/2019  Neg cologuard 03/2018 no FH colon cancer  [place] Pt has records at homebut did not bring today again rec get records  [place] issues    Provider: Dr. French Ana McLean-Scocuzza-Internal Medicine

## 2019-09-14 NOTE — Addendum Note (Signed)
Addended by: Orland Mustard on: 09/14/2019 11:14 AM   Modules accepted: Orders

## 2019-09-14 NOTE — Patient Instructions (Addendum)
[employer] Consider Tdap vaccine in not in 10 years and shingrix vaccine if you have not had  I need copy of DEXA  F/u in 6 months sooner prn    Recombinant Zoster (Shingles) Vaccine: What You Need to Know 1. Why get vaccinated? Recombinant zoster (shingles) vaccine can prevent shingles. Shingles (also called herpes zoster, or just zoster) is a painful skin rash, usually with blisters. In addition to the rash, shingles can cause fever, headache, chills, or upset stomach. More rarely, shingles can lead to pneumonia, hearing problems, blindness, brain inflammation (encephalitis), or death. The most common complication of shingles is long-term nerve pain called postherpetic neuralgia (PHN). PHN occurs in the areas where the shingles rash was, even after the rash clears up. It can last for months or years after the rash goes away. The pain from PHN can be severe and debilitating. About 10 to 18% of people who get shingles will experience PHN. The risk of PHN increases with age. An older adult with shingles is more likely to develop PHN and have longer lasting and more severe pain than a younger person with shingles. Shingles is caused by the varicella zoster virus, the same virus that causes chickenpox. After you have chickenpox, the virus stays in your body and can cause shingles later in life. Shingles cannot be passed from one person to another, but the virus that causes shingles can spread and cause chickenpox in someone who had never had chickenpox or received chickenpox vaccine. 2. Recombinant shingles vaccine Recombinant shingles vaccine provides strong protection against shingles. By preventing shingles, recombinant shingles vaccine also protects against PHN. Recombinant shingles vaccine is the preferred vaccine for the prevention of shingles. However, a different vaccine, live shingles vaccine, may be used in some circumstances. The recombinant shingles vaccine is recommended for adults 50  years and older without serious immune problems. It is given as a two-dose series. This vaccine is also recommended for people who have already gotten another type of shingles vaccine, the live shingles vaccine. There is no live virus in this vaccine. Shingles vaccine may be given at the same time as other vaccines. 3. Talk with your health care provider Tell your vaccine provider if the person getting the vaccine:  Has had an allergic reaction after a previous dose of recombinant shingles vaccine, or has any severe, life-threatening allergies.  Is pregnant or breastfeeding.  Is currently experiencing an episode of shingles. In some cases, your health care provider may decide to postpone shingles vaccination to a future visit. People with minor illnesses, such as a cold, may be vaccinated. People who are moderately or severely ill should usually wait until they recover before getting recombinant shingles vaccine. Your health care provider can give you more information. 4. Risks of a vaccine reaction  A sore arm with mild or moderate pain is very common after recombinant shingles vaccine, affecting about 80% of vaccinated people. Redness and swelling can also happen at the site of the injection.  Tiredness, muscle pain, headache, shivering, fever, stomach pain, and nausea happen after vaccination in more than half of people who receive recombinant shingles vaccine. In clinical trials, about 1 out of 6 people who got recombinant zoster vaccine experienced side effects that prevented them from doing regular activities. Symptoms usually went away on their own in 2 to 3 days. You should still get the second dose of recombinant zoster vaccine even if you had one of these reactions after the first dose. People sometimes faint  after medical procedures, including vaccination. Tell your provider if you feel dizzy or have vision changes or ringing in the ears. As with any medicine, there is a very remote  chance of a vaccine causing a severe allergic reaction, other serious injury, or death. 5. What if there is a serious problem? An allergic reaction could occur after the vaccinated person leaves the clinic. If you see signs of a severe allergic reaction (hives, swelling of the face and throat, difficulty breathing, a fast heartbeat, dizziness, or weakness), call 9-1-1 and get the person to the nearest hospital. For other signs that concern you, call your health care provider. Adverse reactions should be reported to the Vaccine Adverse Event Reporting System (VAERS). Your health care provider will usually file this report, or you can do it yourself. Visit the VAERS website at www.vaers.LAgents.no or call 513-247-6170. VAERS is only for reporting reactions, and VAERS staff do not give medical advice. 6. How can I learn more?  Ask your health care provider.  Call your local or state health department.  Contact the Centers for Disease Control and Prevention (CDC): ? Call 4504294673 (1-800-CDC-INFO) or ? Visit CDC's website at PicCapture.uy Vaccine Information Statement Recombinant Zoster Vaccine (08/26/2018) This information is not intended to replace advice given to you by your health care provider. Make sure you discuss any questions you have with your health care provider. Document Released: 12/24/2016 Document Revised: 02/02/2019 Document Reviewed: 05/20/2018 Elsevier Patient Education  2020 Else[employer] Inc.  Back Exercises The following exercises strengthen the muscles that help to support the trunk and back. They also help to keep the lower back flexible. Doing these exercises can help to prevent back pain or lessen existing pain.  If you have back pain or discomfort, try doing these exercises 2-3 times each day or as told by your health care provider.  As your pain improves, do them once each day, but increase the number of times that you repeat the steps for each exercise (do more  repetitions).  To prevent the recurrence of back pain, continue to do these exercises once each day or as told by your health care provider. Do exercises exactly as told by your health care provider and adjust them as directed. It is normal to feel mild stretching, pulling, tightness, or discomfort as you do these exercises, but you should stop right away if you feel sudden pain or your pain gets worse. Exercises Single knee to chest Repeat these steps 3-5 times for each leg: 1. Lie on your back on a firm bed or the floor with your legs extended. 2. Bring one knee to your chest. Your other leg should stay extended and in contact with the floor. 3. Hold your knee in place by grabbing your knee or thigh with both hands and hold. 4. Pull on your knee until you feel a gentle stretch in your lower back or buttocks. 5. Hold the stretch for 10-30 seconds. 6. Slowly release and straighten your leg. Pelvic tilt Repeat these steps 5-10 times: 1. Lie on your back on a firm bed or the floor with your legs extended. 2. Bend your knees so they are pointing toward the ceiling and your feet are flat on the floor. 3. Tighten your lower abdominal muscles to press your lower back against the floor. This motion will tilt your pelvis so your tailbone points up toward the ceiling instead of pointing to your feet or the floor. 4. With gentle tension and even breathing, hold this  position for 5-10 seconds. Cat-cow Repeat these steps until your lower back becomes more flexible: 1. Get into a hands-and-knees position on a firm surface. Keep your hands under your shoulders, and keep your knees under your hips. You may place padding under your knees for comfort. 2. Let your head hang down toward your chest. Contract your abdominal muscles and point your tailbone toward the floor so your lower back becomes rounded like the back of a cat. 3. Hold this position for 5 seconds. 4. Slowly lift your head, let your abdominal  muscles relax and point your tailbone up toward the ceiling so your back forms a sagging arch like the back of a cow. 5. Hold this position for 5 seconds.  Press-ups Repeat these steps 5-10 times: 1. Lie on your abdomen (face-down) on the floor. 2. Place your palms near your head, about shoulder-width apart. 3. Keeping your back as relaxed as possible and keeping your hips on the floor, slowly straighten your arms to raise the top half of your body and lift your shoulders. Do not use your back muscles to raise your upper torso. You may adjust the placement of your hands to make yourself more comfortable. 4. Hold this position for 5 seconds while you keep your back relaxed. 5. Slowly return to lying flat on the floor.  Bridges Repeat these steps 10 times: 1. Lie on your back on a firm surface. 2. Bend your knees so they are pointing toward the ceiling and your feet are flat on the floor. Your arms should be flat at your sides, next to your body. 3. Tighten your buttocks muscles and lift your buttocks off the floor until your waist is at almost the same height as your knees. You should feel the muscles working in your buttocks and the back of your thighs. If you do not feel these muscles, slide your feet 1-2 inches farther away from your buttocks. 4. Hold this position for 3-5 seconds. 5. Slowly lower your hips to the starting position, and allow your buttocks muscles to relax completely. If this exercise is too easy, try doing it with your arms crossed over your chest. Abdominal crunches Repeat these steps 5-10 times: 1. Lie on your back on a firm bed or the floor with your legs extended. 2. Bend your knees so they are pointing toward the ceiling and your feet are flat on the floor. 3. Cross your arms over your chest. 4. Tip your chin slightly toward your chest without bending your neck. 5. Tighten your abdominal muscles and slowly raise your trunk (torso) high enough to lift your shoulder  blades a tiny bit off the floor. Avoid raising your torso higher than that because it can put too much stress on your low back and does not help to strengthen your abdominal muscles. 6. Slowly return to your starting position. Back lifts Repeat these steps 5-10 times: 1. Lie on your abdomen (face-down) with your arms at your sides, and rest your forehead on the floor. 2. Tighten the muscles in your legs and your buttocks. 3. Slowly lift your chest off the floor while you keep your hips pressed to the floor. Keep the back of your head in line with the curve in your back. Your eyes should be looking at the floor. 4. Hold this position for 3-5 seconds. 5. Slowly return to your starting position. Contact a health care provider if:  Your back pain or discomfort gets much worse when you do an exercise.  Your worsening back pain or discomfort does not lessen within 2 hours after you exercise. If you have any of these problems, stop doing these exercises right away. Do not do them again unless your health care provider says that you can. Get help right away if:  You develop sudden, severe back pain. If this happens, stop doing the exercises right away. Do not do them again unless your health care provider says that you can. This information is not intended to replace advice given to you by your health care provider. Make sure you discuss any questions you have with your health care provider. Document Released: 11/21/2004 Document Revised: 02/18/2019 Document Reviewed: 07/16/2018 Elsevier Patient Education  2020 Elsevier Inc.  Pneumococcal Polysaccharide Vaccine (PPSV23): What You Need to Know 1. Why get vaccinated? Pneumococcal polysaccharide vaccine (PPSV23) can prevent pneumococcal disease. Pneumococcal disease refers to any illness caused by pneumococcal bacteria. These bacteria can cause many types of illnesses, including pneumonia, which is an infection of the lungs. Pneumococcal bacteria are  one of the most common causes of pneumonia. Besides pneumonia, pneumococcal bacteria can also cause:  Ear infections  Sinus infections  Meningitis (infection of the tissue covering the brain and spinal cord)  Bacteremia (bloodstream infection) Anyone can get pneumococcal disease, but children under 412 years of age, people with certain medical conditions, adults 65 years or older, and cigarette smokers are at the highest risk. Most pneumococcal infections are mild. However, some can result in long-term problems, such as brain damage or hearing loss. Meningitis, bacteremia, and pneumonia caused by pneumococcal disease can be fatal. 2. PPSV23 PPSV23 protects against 23 types of bacteria that cause pneumococcal disease. PPSV23 is recommended for:  All adults 65 years or older,  Anyone 2 years or older with certain medical conditions that can lead to an increased risk for pneumococcal disease. Most people need only one dose of PPSV23. A second dose of PPSV23, and another type of pneumococcal vaccine called PCV13, are recommended for certain high-risk groups. Your health care provider can give you more information. People 65 years or older should get a dose of PPSV23 even if they have already gotten one or more doses of the vaccine before they turned 7065. 3. Talk with your health care provider Tell your vaccine provider if the person getting the vaccine:  Has had an allergic reaction after a previous dose of PPSV23, or has any severe, life-threatening allergies. In some cases, your health care provider may decide to postpone PPSV23 vaccination to a future visit. People with minor illnesses, such as a cold, may be vaccinated. People who are moderately or severely ill should usually wait until they recover before getting PPSV23. Your health care provider can give you more information. 4. Risks of a vaccine reaction  Redness or pain where the shot is given, feeling tired, fever, or muscle aches  can happen after PPSV23. People sometimes faint after medical procedures, including vaccination. Tell your provider if you feel dizzy or have vision changes or ringing in the ears. As with any medicine, there is a very remote chance of a vaccine causing a severe allergic reaction, other serious injury, or death. 5. What if there is a serious problem? An allergic reaction could occur after the vaccinated person leaves the clinic. If you see signs of a severe allergic reaction (hives, swelling of the face and throat, difficulty breathing, a fast heartbeat, dizziness, or weakness), call 9-1-1 and get the person to the nearest hospital. For other signs that concern  you, call your health care provider. Adverse reactions should be reported to the Vaccine Adverse Event Reporting System (VAERS). Your health care provider will usually file this report, or you can do it yourself. Visit the VAERS website at www.vaers.SamedayNews.es or call 510-040-6480. VAERS is only for reporting reactions, and VAERS staff do not give medical advice. 6. How can I learn more?  Ask your health care provider.  Call your local or state health department.  Contact the Centers for Disease Control and Prevention (CDC): ? Call 570-385-7981 (1-800-CDC-INFO) or ? Visit CDC's website at http://hunter.com/ CDC Vaccine Information Statement PPSV23 Vaccine (08/26/2018) This information is not intended to replace advice given to you by your health care provider. Make sure you discuss any questions you have with your health care provider. Document Released: 08/11/2006 Document Revised: 02/02/2019 Document Reviewed: 05/26/2018 Elsevier Patient Education  2020 [employer].

## 2020-03-14 ENCOUNTER — Other Ambulatory Visit: Payer: Self-pay

## 2020-03-16 ENCOUNTER — Ambulatory Visit (INDEPENDENT_AMBULATORY_CARE_PROVIDER_SITE_OTHER): Payer: Medicare Other | Admitting: Internal Medicine

## 2020-03-16 ENCOUNTER — Encounter: Payer: Self-pay | Admitting: Internal Medicine

## 2020-03-16 ENCOUNTER — Other Ambulatory Visit: Payer: Self-pay

## 2020-03-16 VITALS — BP 140/70 | HR 77 | Temp 97.4°F | Ht 62.99 in | Wt 114.2 lb

## 2020-03-16 DIAGNOSIS — R7989 Other specified abnormal findings of blood chemistry: Secondary | ICD-10-CM

## 2020-03-16 DIAGNOSIS — M19049 Primary osteoarthritis, unspecified hand: Secondary | ICD-10-CM

## 2020-03-16 DIAGNOSIS — I10 Essential (primary) hypertension: Secondary | ICD-10-CM

## 2020-03-16 DIAGNOSIS — Z23 Encounter for immunization: Secondary | ICD-10-CM

## 2020-03-16 DIAGNOSIS — R079 Chest pain, unspecified: Secondary | ICD-10-CM

## 2020-03-16 DIAGNOSIS — M419 Scoliosis, unspecified: Secondary | ICD-10-CM

## 2020-03-16 MED ORDER — AMLODIPINE BESYLATE 5 MG PO TABS: 5.0000 mg | ORAL_TABLET | Freq: Every day | ORAL | 3 refills | Status: DC

## 2020-03-16 NOTE — Progress Notes (Signed)
Chief Complaint  Patient presents with  . 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 HTN on norvasc 5 mg qd BP sl elevated today  2. Arthritis in hands pain at times mild to moderate nothing tried holistic MD in [place] mailing her some meds to try for this  At times also having low back pain and mid back pain with h/o scoliosis disc back exercises today  Review of Systems  Constitutional: Negative for weight loss.  HENT: Negative for hearing loss.   Eyes: Negative for blurred vision.  Respiratory: Negative for shortness of breath.   Cardiovascular: Negative for chest pain.  Gastrointestinal: Negative for abdominal pain.  Musculoskeletal: Positive for joint pain.  Skin: Negative for rash.  Neurological: Negative for headaches.  Psychiatric/Behavioral: Negative for depression and memory loss.   Past Medical History:  Diagnosis Date  . Anemia    kid  . Cataract   . Constipation   . COPD (chronic obstructive pulmonary disease) ([place])   . Frequent headaches   . Glaucoma   . Glaucoma   . Hyperlipidemia   . Hypertension   . Pneumonia    11/2017   . Postnasal drip    Past Surgical History:  Procedure Laterality Date  . ABDOMINAL HYSTERECTOMY    . TONSILLECTOMY     Family History  Problem Relation Age of Onset  . Heart disease Mother   . Heart murmur Mother        originated from typhoid disease  . Stroke Mother   . Stroke Father    Social History   Socioeconomic History  . Marital status: Widowed    Spouse name: Not on file  . Number of children: Not on file  . Years of education: Not on file  . Highest education level: Not on file  Occupational History  . Not on file  Tobacco Use  . Smoking status: Never Smoker  . Smokeless tobacco: Never Used  Substance and Sexual Activity  . Alcohol use: No  . Drug use: No  . Sexual activity: Never    Partners: Male  Other Topics Concern  . 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 Difficulty of Paying Living Expenses:   Food Insecurity:   . Worried About Charity fundraiser in the Last Year:   . Arboriculturist in the Last Year:   Transportation Needs:   . Film/video editor (Medical):   Marland Kitchen Lack of Transportation (Non-Medical):   Physical Activity:   . Days of Exercise per Week:   . Minutes of Exercise per Session:   Stress:   . Feeling of Stress :   Social Connections: Unknown  . Frequency of Communication with Friends and Family: Not on file  . Frequency of Social Gatherings with Friends and Family: Not on file  . Attends Religious Services: Not on file  . Active Member of Clubs or Organizations: Not on file  . Attends Archivist Meetings: Not on file  . Marital Status: Widowed  Intimate Partner Violence:   . Fear of Current or Ex-Partner:   . Emotionally Abused:   Marland Kitchen Physically Abused:   . Sexually Abused:    Current Meds  Medication Sig  .  amLODipine (NORVASC) 5 MG tablet Take 1 tablet (5 mg total) by mouth daily.  . cyclobenzaprine (FLEXERIL) 5 MG tablet Take 1 tablet (5 mg total) by mouth at bedtime as needed for muscle spasms.  . progesterone (PROMETRIUM) 200 MG capsule TAKE ONE CAPSULE BY MOUTH DAILY AT BEDTIME  . [DISCONTINUED] amLODipine (NORVASC) 5 MG tablet Take 1 tablet (5 mg total) by mouth daily.   No Known Allergies No results found for this or any previous visit (from the past 2160 hour(s)). Objective  Body mass index is 20.23 kg/m. Wt Readings from Last 3 Encounters:  03/16/20 114 lb 3.2 oz (51.8 kg)  09/14/19 115 lb 12.8 oz (52.5 kg)  08/11/19 120 lb (54.4 kg)   Temp Readings from Last 3 Encounters:  03/16/20 (!) 97.4 F (36.3 C)  09/14/19 (!) 97.3 F (36.3 C) (Skin)  08/11/19 98.7 F (37.1 C) (Oral)   BP Readings from Last 3 Encounters:  03/16/20 140/70   09/14/19 112/70  08/11/19 129/79   Pulse Readings from Last 3 Encounters:  03/16/20 77  09/14/19 91  08/11/19 81    Physical Exam Vitals and nursing note reviewed.  Constitutional:      Appearance: Normal appearance. She is well-developed and well-groomed.  HENT:     Head: Normocephalic and atraumatic.  Eyes:     Conjunctiva/sclera: Conjunctivae normal.     Pupils: Pupils are equal, round, and reactive to light.  Cardiovascular:     Rate and Rhythm: Normal rate and regular rhythm.     Heart sounds: Normal heart sounds. No murmur.  Pulmonary:     Effort: Pulmonary effort is normal.     Breath sounds: Normal breath sounds.  Skin:    General: Skin is warm and dry.  Neurological:     General: No focal deficit present.     Mental Status: She is alert and oriented to person, place, and time. Mental status is at baseline.     Gait: Gait normal.  Psychiatric:        Attention and Perception: Attention and perception normal.        Mood and Affect: Mood and affect normal.        Speech: Speech normal.        Behavior: Behavior normal. Behavior is cooperative.        Thought Content: Thought content normal.        Cognition and Memory: Cognition and memory normal.        Judgment: Judgment normal.     Assessment  Plan  Essential hypertension - Plan: amLODipine (NORVASC) 5 MG tablet Monitor BP   Scoliosis of thoracolumbar spine, unspecified scoliosis type Given back exercises today   Arthritis of hand  Given hand exercises today Prn volatren gel  HM sch  Fasting labs [facility] and have labs from [facility] faxed to me Dr. Littie Deeds [place].  Call back with name of eye doctor   Declines flu shot Declines covid vx prevnar utd, pna 23 vaccine utd  Tdap had ? Datecheck date  shingrix disc today -given Rx shingrix and Tdap today  No pap s/p hysterectomy DUB rec mammoneed recordscaliforniadeclines as of 03/16/20 Negcologuard6/2019no FH colon cancer due  03/2021  H/o DEXA in [place]. Pt has records at homebut did not bring todayagain rec get [place] issues   Provider: Dr. French Ana McLean-Scocuzza-[facility]

## 2020-03-16 NOTE — Patient Instructions (Addendum)
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 These exercises can strengthen the hands, improve flexibility and movement, and increase blood flow to the hands. These results can make work and daily tasks easier. Hand exercises can be especially helpful for people who have joint pain from arthritis or have nerve damage from overuse (carpal tunnel syndrome). These exercises can also help people who have injured a hand. Exercises Most of these hand exercises are gentle stretching and motion exercises. It is usually safe to do them often throughout the day. Warming up your hands before exercise may help to reduce stiffness. You can do this with gentle massage or by placing your hands in warm water for 10-15 minutes. It is normal to feel some stretching, pulling, tightness, or mild discomfort as you begin new exercises. This will gradually improve. Stop an exercise right away if you feel sudden, severe pain or your pain gets worse. Ask your health care provider which exercises are best for you. Knuckle bend or "claw" fist 1. Stand or sit with your arm, hand, and all five fingers pointed straight up. Make sure to keep your wrist straight during the exercise. 2. Gently bend your fingers down toward your palm until the tips of your fingers are touching the top of your palm. Keep your big knuckle straight and just bend the small knuckles in your fingers. 3. Hold this position for __________ seconds. 4. Straighten (extend) your fingers back to the starting position. Repeat this exercise 5-10 times with each hand. Full finger fist 1. Stand or sit with your arm, hand, and all five fingers pointed straight up. Make sure to keep your wrist straight during the exercise. 2. Gently bend your fingers into your palm until  the tips of your fingers are touching the middle of your palm. 3. Hold this position for __________ seconds. 4. Extend your fingers back to the starting position, stretching every joint fully. Repeat this exercise 5-10 times with each hand. Straight fist 1. Stand or sit with your arm, hand, and all five fingers pointed straight up. Make sure to keep your wrist straight during the exercise. 2. Gently bend your fingers at the big knuckle, where your fingers meet your hand, and the middle knuckle. Keep the knuckle at the tips of your fingers straight and try to touch the bottom of your palm. 3. Hold this position for __________ seconds. 4. Extend your fingers back to the starting position, stretching every joint fully. Repeat this exercise 5-10 times with each hand. Tabletop 1. Stand or sit with your arm, hand, and all five fingers pointed straight up. Make sure to keep your wrist straight during the exercise. 2. Gently bend your fingers at the big knuckle, where your fingers meet your hand, as far down as you can while keeping the small knuckles in your fingers straight. Think of forming a tabletop with your fingers. 3. Hold this position for __________ seconds. 4. Extend your fingers back to the starting position, stretching every joint fully. Repeat this exercise 5-10 times with each hand. Finger spread 1. Place your hand flat on a table with your palm facing down. Make sure your wrist stays straight as you do this exercise. 2. Spread your fingers and thumb apart from each other as far as you can until you  feel a gentle stretch. Hold this position for __________ seconds. 3. Bring your fingers and thumb tight together again. Hold this position for __________ seconds. Repeat this exercise 5-10 times with each hand. Making circles 1. Stand or sit with your arm, hand, and all five fingers pointed straight up. Make sure to keep your wrist straight during the exercise. 2. Make a circle by touching  the tip of your thumb to the tip of your index finger. 3. Hold for __________ seconds. Then open your hand wide. 4. Repeat this motion with your thumb and each finger on your hand. Repeat this exercise 5-10 times with each hand. Thumb motion 1. Sit with your forearm resting on a table and your wrist straight. Your thumb should be facing up toward the ceiling. Keep your fingers relaxed as you move your thumb. 2. Lift your thumb up as high as you can toward the ceiling. Hold for __________ seconds. 3. Bend your thumb across your palm as far as you can, reaching the tip of your thumb for the small finger (pinkie) side of your palm. Hold for __________ seconds. Repeat this exercise 5-10 times with each hand. Grip strengthening  1. Hold a stress ball or other soft ball in the middle of your hand. 2. Slowly increase the pressure, squeezing the ball as much as you can without causing pain. Think of bringing the tips of your fingers into the middle of your palm. All of your finger joints should bend when doing this exercise. 3. Hold your squeeze for __________ seconds, then relax. Repeat this exercise 5-10 times with each hand. Contact a health care provider if:  Your hand pain or discomfort gets much worse when you do an exercise.  Your hand pain or discomfort does not improve within 2 hours after you exercise. If you have any of these problems, stop doing these exercises right away. Do not do them again unless your health care provider says that you can. Get help right away if:  You develop sudden, severe hand pain or swelling. If this happens, stop doing these exercises right away. Do not do them again unless your health care provider says that you can. This information is not intended to replace advice given to you by your health care provider. Make sure you discuss any questions you have with your health care provider. Document Revised: 02/04/2019 Document Reviewed: 10/15/2018 Elsevier Patient  Education  [place].  Arthritis Arthritis is a term that is commonly used to refer to joint pain or joint disease. There are more than 100 types of arthritis. What are the causes? The most common cause of this condition is wear and tear of a joint. Other causes include:  Gout.  Inflammation of a joint.  An infection of a joint.  Sprains and other injuries near the joint.  A reaction to medicines or drugs, or an allergic reaction. In some cases, the cause may not be known. What are the signs or symptoms? The main symptom of this condition is pain in the joint during movement. Other symptoms include:  Redness, swelling, or stiffness at a joint.  Warmth coming from the joint.  Fever.  Overall feeling of illness. How is this diagnosed? This condition may be diagnosed with a physical exam and tests, including:  Blood tests.  Urine tests.  Imaging tests, such as X-rays, an MRI, or a CT scan. Sometimes, fluid is removed from a joint for testing. How is this treated? This condition may be treated  with:  Treatment of the cause, if it is known.  Rest.  Raising (elevating) the joint.  Applying cold or hot packs to the joint.  Medicines to improve symptoms and reduce inflammation.  Injections of a steroid such as cortisone into the joint to help reduce pain and inflammation. Depending on the cause of your arthritis, you may need to make lifestyle changes to reduce stress on your joint. Changes may include:  Exercising more.  Losing weight. Follow these instructions at home: Medicines  Take over-the-counter and prescription medicines only as told by your health care provider.  Do not take aspirin to relieve pain if your health care provider thinks that gout may be causing your pain. Activity  Rest your joint if told by your health care provider. Rest is important when your disease is active and your joint feels painful, swollen, or stiff.  Avoid  activities that make the pain worse. It is important to balance activity with rest.  Exercise your joint regularly with range-of-motion exercises as told by your health care provider. Try doing low-impact exercise, such as: ? Swimming. ? Water aerobics. ? Biking. ? Walking. Managing pain, stiffness, and swelling      If directed, put ice on the joint. ? Put ice in a plastic bag. ? Place a towel between your skin and the bag. ? Leave the ice on for 20 minutes, 2-3 times per day.  If your joint is swollen, raise (elevate) it above the level of your heart if directed by your health care provider.  If your joint feels stiff in the morning, try taking a warm shower.  If directed, apply heat to the affected area as often as told by your health care provider. Use the heat source that your health care provider recommends, such as a moist heat pack or a heating pad. If you have diabetes, do not apply heat without permission from your health care provider. To apply heat: ? Place a towel between your skin and the heat source. ? Leave the heat on for 20-30 minutes. ? Remove the heat if your skin turns bright red. This is especially important if you are unable to feel pain, heat, or cold. You may have a greater risk of getting burned. General instructions  Do not use any products that contain nicotine or tobacco, such as cigarettes, e-cigarettes, and chewing tobacco. If you need help quitting, ask your health care provider.  Keep all follow-up visits as told by your health care provider. This is important. Contact a health care provider if:  The pain gets worse.  You have a fever. Get help right away if:  You develop severe joint pain, swelling, or redness.  Many joints become painful and swollen.  You develop severe back pain.  You develop severe weakness in your leg.  You cannot control your bladder or bowels. Summary  Arthritis is a term that is commonly used to refer to joint  pain or joint disease. There are more than 100 types of arthritis.  The most common cause of this condition is wear and tear of a joint. Other causes include gout, inflammation or infection of the joint, sprains, or allergies.  Symptoms of this condition include redness, swelling, or stiffness of the joint. Other symptoms include warmth, fever, or feeling ill.  This condition is treated with rest, elevation, medicines, and applying cold or hot packs.  Follow your health care provider's instructions about medicines, activity, exercises, and other home care treatments. This information is  not intended to replace advice given to you by your health care provider. Make sure you discuss any questions you have with your health care provider. Document Revised: 09/21/2018 Document Reviewed: 09/21/2018 Elsevier Patient Education  2020 [employer]   Recombinant Zoster (Shingles) Vaccine: What You Need to Know 1. Why get vaccinated? Recombinant zoster (shingles) vaccine can prevent shingles. Shingles (also called herpes zoster, or just zoster) is a painful skin rash, usually with blisters. In addition to the rash, shingles can cause fever, headache, chills, or upset stomach. More rarely, shingles can lead to pneumonia, hearing problems, blindness, brain inflammation (encephalitis), or death. The most common complication of shingles is long-term nerve pain called postherpetic neuralgia (PHN). PHN occurs in the areas where the shingles rash was, even after the rash clears up. It can last for months or years after the rash goes away. The pain from PHN can be severe and debilitating. About 10 to 18% of people who get shingles will experience PHN. The risk of PHN increases with age. An older adult with shingles is more likely to develop PHN and have longer lasting and more severe pain than a younger person with shingles. Shingles is caused by the varicella zoster virus, the same virus that causes chickenpox.  After you have chickenpox, the virus stays in your body and can cause shingles later in life. Shingles cannot be passed from one person to another, but the virus that causes shingles can spread and cause chickenpox in someone who had never had chickenpox or received chickenpox vaccine. 2. Recombinant shingles vaccine Recombinant shingles vaccine provides strong protection against shingles. By preventing shingles, recombinant shingles vaccine also protects against PHN. Recombinant shingles vaccine is the preferred vaccine for the prevention of shingles. However, a different vaccine, live shingles vaccine, may be used in some circumstances. The recombinant shingles vaccine is recommended for adults 50 years and older without serious immune problems. It is given as a two-dose series. This vaccine is also recommended for people who have already gotten another type of shingles vaccine, the live shingles vaccine. There is no live virus in this vaccine. Shingles vaccine may be given at the same time as other vaccines. 3. Talk with your health care provider Tell your vaccine provider if the person getting the vaccine:  Has had an allergic reaction after a previous dose of recombinant shingles vaccine, or has any severe, life-threatening allergies.  Is pregnant or breastfeeding.  Is currently experiencing an episode of shingles. In some cases, your health care provider may decide to postpone shingles vaccination to a future visit. People with minor illnesses, such as a cold, may be vaccinated. People who are moderately or severely ill should usually wait until they recover before getting recombinant shingles vaccine. Your health care provider can give you more information. 4. Risks of a vaccine reaction  A sore arm with mild or moderate pain is very common after recombinant shingles vaccine, affecting about 80% of vaccinated people. Redness and swelling can also happen at the site of the  injection.  Tiredness, muscle pain, headache, shivering, fever, stomach pain, and nausea happen after vaccination in more than half of people who receive recombinant shingles vaccine. In clinical trials, about 1 out of 6 people who got recombinant zoster vaccine experienced side effects that prevented them from doing regular activities. Symptoms usually went away on their own in 2 to 3 days. You should still get the second dose of recombinant zoster vaccine even if you had one of these reactions  after the first dose. People sometimes faint after medical procedures, including vaccination. Tell your provider if you feel dizzy or have vision changes or ringing in the ears. As with any medicine, there is a very remote chance of a vaccine causing a severe allergic reaction, other serious injury, or death. 5. What if there is a serious problem? An allergic reaction could occur after the vaccinated person leaves the clinic. If you see signs of a severe allergic reaction (hives, swelling of the face and throat, difficulty breathing, a fast heartbeat, dizziness, or weakness), call 9-1-1 and get the person to the nearest hospital. For other signs that concern you, call your health care provider. Adverse reactions should be reported to the Vaccine Adverse Event Reporting System (VAERS). Your health care provider will usually file this report, or you can do it yourself. Visit the VAERS website at www.vaers.LAgents.no or call 971-263-9517. VAERS is only for reporting reactions, and VAERS staff do not give medical advice. 6. How can I learn more?  Ask your health care provider.  Call your local or state health department.  Contact the Centers for Disease Control and Prevention (CDC): ? Call 719-470-0205 (1-800-CDC-INFO) or ? Visit CDC's website at PicCapture.uy Vaccine Information Statement Recombinant Zoster Vaccine (08/26/2018) This information is not intended to replace advice given to you by your  health care provider. Make sure you discuss any questions you have with your health care provider. Document Revised: 02/02/2019 Document Reviewed: 05/20/2018 Elsevier Patient Education  2020 Else[employer] Inc.    Back Exercises The following exercises strengthen the muscles that help to support the trunk and back. They also help to keep the lower back flexible. Doing these exercises can help to prevent back pain or lessen existing pain.  If you have back pain or discomfort, try doing these exercises 2-3 times each day or as told by your health care provider.  As your pain improves, do them once each day, but increase the number of times that you repeat the steps for each exercise (do more repetitions).  To prevent the recurrence of back pain, continue to do these exercises once each day or as told by your health care provider. Do exercises exactly as told by your health care provider and adjust them as directed. It is normal to feel mild stretching, pulling, tightness, or discomfort as you do these exercises, but you should stop right away if you feel sudden pain or your pain gets worse. Exercises Single knee to chest Repeat these steps 3-5 times for each leg: 1. Lie on your back on a firm bed or the floor with your legs extended. 2. Bring one knee to your chest. Your other leg should stay extended and in contact with the floor. 3. Hold your knee in place by grabbing your knee or thigh with both hands and hold. 4. Pull on your knee until you feel a gentle stretch in your lower back or buttocks. 5. Hold the stretch for 10-30 seconds. 6. Slowly release and straighten your leg. Pelvic tilt Repeat these steps 5-10 times: 1. Lie on your back on a firm bed or the floor with your legs extended. 2. Bend your knees so they are pointing toward the ceiling and your feet are flat on the floor. 3. Tighten your lower abdominal muscles to press your lower back against the floor. This motion will tilt your  pelvis so your tailbone points up toward the ceiling instead of pointing to your feet or the floor. 4. With gentle  tension and even breathing, hold this position for 5-10 seconds. Cat-cow Repeat these steps until your lower back becomes more flexible: 1. Get into a hands-and-knees position on a firm surface. Keep your hands under your shoulders, and keep your knees under your hips. You may place padding under your knees for comfort. 2. Let your head hang down toward your chest. Contract your abdominal muscles and point your tailbone toward the floor so your lower back becomes rounded like the back of a cat. 3. Hold this position for 5 seconds. 4. Slowly lift your head, let your abdominal muscles relax and point your tailbone up toward the ceiling so your back forms a sagging arch like the back of a cow. 5. Hold this position for 5 seconds.  Press-ups Repeat these steps 5-10 times: 1. Lie on your abdomen (face-down) on the floor. 2. Place your palms near your head, about shoulder-width apart. 3. Keeping your back as relaxed as possible and keeping your hips on the floor, slowly straighten your arms to raise the top half of your body and lift your shoulders. Do not use your back muscles to raise your upper torso. You may adjust the placement of your hands to make yourself more comfortable. 4. Hold this position for 5 seconds while you keep your back relaxed. 5. Slowly return to lying flat on the floor.  Bridges Repeat these steps 10 times: 1. Lie on your back on a firm surface. 2. Bend your knees so they are pointing toward the ceiling and your feet are flat on the floor. Your arms should be flat at your sides, next to your body. 3. Tighten your buttocks muscles and lift your buttocks off the floor until your waist is at almost the same height as your knees. You should feel the muscles working in your buttocks and the back of your thighs. If you do not feel these muscles, slide your feet 1-2  inches farther away from your buttocks. 4. Hold this position for 3-5 seconds. 5. Slowly lower your hips to the starting position, and allow your buttocks muscles to relax completely. If this exercise is too easy, try doing it with your arms crossed over your chest. Abdominal crunches Repeat these steps 5-10 times: 1. Lie on your back on a firm bed or the floor with your legs extended. 2. Bend your knees so they are pointing toward the ceiling and your feet are flat on the floor. 3. Cross your arms over your chest. 4. Tip your chin slightly toward your chest without bending your neck. 5. Tighten your abdominal muscles and slowly raise your trunk (torso) high enough to lift your shoulder blades a tiny bit off the floor. Avoid raising your torso higher than that because it can put too much stress on your low back and does not help to strengthen your abdominal muscles. 6. Slowly return to your starting position. Back lifts Repeat these steps 5-10 times: 1. Lie on your abdomen (face-down) with your arms at your sides, and rest your forehead on the floor. 2. Tighten the muscles in your legs and your buttocks. 3. Slowly lift your chest off the floor while you keep your hips pressed to the floor. Keep the back of your head in line with the curve in your back. Your eyes should be looking at the floor. 4. Hold this position for 3-5 seconds. 5. Slowly return to your starting position. Contact a health care provider if:  Your back pain or discomfort gets much worse  when you do an exercise.  Your worsening back pain or discomfort does not lessen within 2 hours after you exercise. If you have any of these problems, stop doing these exercises right away. Do not do them again unless your health care provider says that you can. Get help right away if:  You develop sudden, severe back pain. If this happens, stop doing the exercises right away. Do not do them again unless your health care provider says that  you can. This information is not intended to replace advice given to you by your health care provider. Make sure you discuss any questions you have with your health care provider. Document Revised: 02/18/2019 Document Reviewed: 07/16/2018 Elsevier Patient Education  2020 [employer].   Low Back Sprain or Strain Rehab Ask your health care provider which exercises are safe for you. Do exercises exactly as told by your health care provider and adjust them as directed. It is normal to feel mild stretching, pulling, tightness, or discomfort as you do these exercises. Stop right away if you feel sudden pain or your pain gets worse. Do not begin these exercises until told by your health care provider. Stretching and range-of-motion exercises These exercises warm up your muscles and joints and improve the movement and flexibility of your back. These exercises also help to relieve pain, numbness, and tingling. Lumbar rotation  1. Lie on your back on a firm surface and bend your knees. 2. Straighten your arms out to your sides so each arm forms a 90-degree angle (right angle) with a side of your body. 3. Slowly move (rotate) both of your knees to one side of your body until you feel a stretch in your lower back (lumbar). Try not to let your shoulders lift off the floor. 4. Hold this position for __________ seconds. 5. Tense your abdominal muscles and slowly move your knees back to the starting position. 6. Repeat this exercise on the other side of your body. Repeat __________ times. Complete this exercise __________ times a day. Single knee to chest  1. Lie on your back on a firm surface with both legs straight. 2. Bend one of your knees. Use your hands to move your knee up toward your chest until you feel a gentle stretch in your lower back and buttock. ? Hold your leg in this position by holding on to the front of your knee. ? Keep your other leg as straight as possible. 3. Hold this position for  __________ seconds. 4. Slowly return to the starting position. 5. Repeat with your other leg. Repeat __________ times. Complete this exercise __________ times a day. Prone extension on elbows  1. Lie on your abdomen on a firm surface (prone position). 2. Prop yourself up on your elbows. 3. Use your arms to help lift your chest up until you feel a gentle stretch in your abdomen and your lower back. ? This will place some of your body weight on your elbows. If this is uncomfortable, try stacking pillows under your chest. ? Your hips should stay down, against the surface that you are lying on. Keep your hip and back muscles relaxed. 4. Hold this position for __________ seconds. 5. Slowly relax your upper body and return to the starting position. Repeat __________ times. Complete this exercise __________ times a day. Strengthening exercises These exercises build strength and endurance in your back. Endurance is the ability to use your muscles for a long time, even after they get tired. Pelvic tilt This  exercise strengthens the muscles that lie deep in the abdomen. 1. Lie on your back on a firm surface. Bend your knees and keep your feet flat on the floor. 2. Tense your abdominal muscles. Tip your pelvis up toward the ceiling and flatten your lower back into the floor. ? To help with this exercise, you may place a small towel under your lower back and try to push your back into the towel. 3. Hold this position for __________ seconds. 4. Let your muscles relax completely before you repeat this exercise. Repeat __________ times. Complete this exercise __________ times a day. Alternating arm and leg raises  1. Get on your hands and knees on a firm surface. If you are on a hard floor, you may want to use padding, such as an exercise mat, to cushion your knees. 2. Line up your arms and legs. Your hands should be directly below your shoulders, and your knees should be directly below your  hips. 3. Lift your left leg behind you. At the same time, raise your right arm and straighten it in front of you. ? Do not lift your leg higher than your hip. ? Do not lift your arm higher than your shoulder. ? Keep your abdominal and back muscles tight. ? Keep your hips facing the ground. ? Do not arch your back. ? Keep your balance carefully, and do not hold your breath. 4. Hold this position for __________ seconds. 5. Slowly return to the starting position. 6. Repeat with your right leg and your left arm. Repeat __________ times. Complete this exercise __________ times a day. Abdominal set with straight leg raise  1. Lie on your back on a firm surface. 2. Bend one of your knees and keep your other leg straight. 3. Tense your abdominal muscles and lift your straight leg up, 4-6 inches (10-15 cm) off the ground. 4. Keep your abdominal muscles tight and hold this position for __________ seconds. ? Do not hold your breath. ? Do not arch your back. Keep it flat against the ground. 5. Keep your abdominal muscles tense as you slowly lower your leg back to the starting position. 6. Repeat with your other leg. Repeat __________ times. Complete this exercise __________ times a day. Single leg lower with bent knees 1. Lie on your back on a firm surface. 2. Tense your abdominal muscles and lift your feet off the floor, one foot at a time, so your knees and hips are bent in 90-degree angles (right angles). ? Your knees should be over your hips and your lower legs should be parallel to the floor. 3. Keeping your abdominal muscles tense and your knee bent, slowly lower one of your legs so your toe touches the ground. 4. Lift your leg back up to return to the starting position. ? Do not hold your breath. ? Do not let your back arch. Keep your back flat against the ground. 5. Repeat with your other leg. Repeat __________ times. Complete this exercise __________ times a day. Posture and body  mechanics Good posture and healthy body mechanics can help to relieve stress in your body's tissues and joints. Body mechanics refers to the movements and positions of your body while you do your daily activities. Posture is part of body mechanics. Good posture means:  Your spine is in its natural S-curve position (neutral).  Your shoulders are pulled back slightly.  Your head is not tipped forward. Follow these guidelines to improve your posture and body mechanics in  your everyday activities. Standing   When standing, keep your spine neutral and your feet about hip width apart. Keep a slight bend in your knees. Your ears, shoulders, and hips should line up.  When you do a task in which you stand in one place for a long time, place one foot up on a stable object that is 2-4 inches (5-10 cm) high, such as a footstool. This helps keep your spine neutral. Sitting   When sitting, keep your spine neutral and keep your feet flat on the floor. Use a footrest, if necessary, and keep your thighs parallel to the floor. Avoid rounding your shoulders, and avoid tilting your head forward.  When working at a desk or a computer, keep your desk at a height where your hands are slightly lower than your elbows. Slide your chair under your desk so you are close enough to maintain good posture.  When working at a computer, place your monitor at a height where you are looking straight ahead and you do not have to tilt your head forward or downward to look at the screen. Resting  When lying down and resting, avoid positions that are most painful for you.  If you have pain with activities such as sitting, bending, stooping, or squatting, lie in a position in which your body does not bend very much. For example, avoid curling up on your side with your arms and knees near your chest (fetal position).  If you have pain with activities such as standing for a long time or reaching with your arms, lie with your  spine in a neutral position and bend your knees slightly. Try the following positions: ? Lying on your side with a pillow between your knees. ? Lying on your back with a pillow under your knees. Lifting   When lifting objects, keep your feet at least shoulder width apart and tighten your abdominal muscles.  Bend your knees and hips and keep your spine neutral. It is important to lift using the strength of your legs, not your back. Do not lock your knees straight out.  Always ask for help to lift heavy or awkward objects. This information is not intended to replace advice given to you by your health care provider. Make sure you discuss any questions you have with your health care provider. Document Revised: 02/05/2019 Document Reviewed: 11/05/2018 Elsevier Patient Education  2020 [employer].   Thoracic Strain Rehab Ask your health care provider which exercises are safe for you. Do exercises exactly as told by your health care provider and adjust them as directed. It is normal to feel mild stretching, pulling, tightness, or discomfort as you do these exercises. Stop right away if you feel sudden pain or your pain gets worse. Do not begin these exercises until told by your health care provider. Stretching and range-of-motion exercise This exercise warms up your muscles and joints and improves the movement and flexibility of your back and shoulders. This exercise also helps to relieve pain. Chest and spine stretch  7. Lie down on your back on a firm surface. 8. Roll a towel or a small blanket so it is about 4 inches (10 cm) in diameter. 9. Put the towel lengthwise under the middle of your back so it is under your spine, but not under your shoulder blades. 10. Put your hands behind your head and let your elbows fall to your sides. This will increase your stretch. 11. Take a deep breath (inhale). 12. Hold for  __________ seconds. 13. Relax after you breathe out (exhale). Repeat __________  times. Complete this exercise __________ times a day. Strengthening exercises These exercises build strength and endurance in your back and your shoulder blade muscles. Endurance is the ability to use your muscles for a long time, even after they get tired. Alternating arm and leg raises  6. Get on your hands and knees on a firm surface. If you are on a hard floor, you may want to use padding, such as an exercise mat, to cushion your knees. 7. Line up your arms and legs. Your hands should be directly below your shoulders, and your knees should be directly below your hips. 8. Lift your left leg behind you. At the same time, raise your right arm and straighten it in front of you. ? Do not lift your leg higher than your hip. ? Do not lift your arm higher than your shoulder. ? Keep your abdominal and back muscles tight. ? Keep your hips facing the ground. ? Do not arch your back. ? Keep your balance carefully, and do not hold your breath. 9. Hold for __________ seconds. 10. Slowly return to the starting position and repeat with your right leg and your left arm. Repeat __________ times. Complete this exercise __________ times a day. Straight arm rows This exercise is also called shoulder extension exercise. 6. Stand with your feet shoulder width apart. 7. Secure an exercise band to a stable object in front of you so the band is at or above shoulder height. 8. Hold one end of the exercise band in each hand. 9. Straighten your elbows and lift your hands up to shoulder height. 10. Step back, away from the secured end of the exercise band, until the band stretches. 11. Squeeze your shoulder blades together and pull your hands down to the sides of your thighs. Stop when your hands are straight down by your sides. This is shoulder extension. Do not let your hands go behind your body. 12. Hold for __________ seconds. 13. Slowly return to the starting position. Repeat __________ times. Complete this  exercise __________ times a day. Prone shoulder external rotation 5. Lie on your abdomen on a firm bed so your left / right forearm hangs over the edge of the bed and your upper arm is on the bed, straight out from your body. This is the prone position. ? Your elbow should be bent. ? Your palm should be facing your feet. 6. If instructed, hold a __________ weight in your hand. 7. Squeeze your shoulder blade toward the middle of your back. Do not let your shoulder lift toward your ear. 8. Keep your elbow bent in a 90-degree angle (right angle) while you slowly move your forearm up toward the ceiling. Move your forearm up to the height of the bed, toward your head. This is external rotation. ? Your upper arm should not move. ? At the top of the movement, your palm should face the floor. 9. Hold for __________ seconds. 10. Slowly return to the starting position and relax your muscles. Repeat __________ times. Complete this exercise __________ times a day. Rowing scapular retraction This is an exercise in which the shoulder blades (scapulae) are pulled toward each other (retraction). 7. Sit in a stable chair without armrests, or stand up. 8. Secure an exercise band to a stable object in front of you so the band is at shoulder height. 9. Hold one end of the exercise band in each hand. Your palms  should face down. 10. Bring your arms out straight in front of you. 11. Step back, away from the secured end of the exercise band, until the band stretches. 12. Pull the band backward. As you do this, bend your elbows and squeeze your shoulder blades together, but avoid letting the rest of your body move. Do not shrug your shoulders upward while you do this. 13. Stop when your elbows are at your sides or slightly behind your body. 14. Hold for __________ seconds. 15. Slowly straighten your arms to return to the starting position. Repeat __________ times. Complete this exercise __________ times a  day. Posture and body mechanics Good posture and healthy body mechanics can help to relieve stress in your body's tissues and joints. Body mechanics refers to the movements and positions of your body while you do your daily activities. Posture is part of body mechanics. Good posture means:  Your spine is in its natural S-curve position (neutral).  Your shoulders are pulled back slightly.  Your head is not tipped forward. Follow these guidelines to improve your posture and body mechanics in your everyday activities. Standing   When standing, keep your spine neutral and your feet about hip width apart. Keep a slight bend in your knees. Your ears, shoulders, and hips should line up with each other.  When you do a task in which you lean forward while standing in one place for a long time, place one foot up on a stable object that is 2-4 inches (5-10 cm) high, such as a footstool. This helps keep your spine neutral. Sitting   When sitting, keep your spine neutral and keep your feet flat on the floor. Use a footrest, if necessary, and keep your thighs parallel to the floor. Avoid rounding your shoulders, and avoid tilting your head forward.  When working at a desk or a computer, keep your desk at a height where your hands are slightly lower than your elbows. Slide your chair under your desk so you are close enough to maintain good posture.  When working at a computer, place your monitor at a height where you are looking straight ahead and you do not have to tilt your head forward or downward to look at the screen. Resting When lying down and resting, avoid positions that are most painful for you.  If you have pain with activities such as sitting, bending, stooping, or squatting (flexion-basedactivities), lie in a position in which your body does not bend very much. For example, avoid curling up on your side with your arms and knees near your chest (fetal position).  If you have pain with  activities such as standing for a long time or reaching with your arms (extension-basedactivities), lie with your spine in a neutral position and bend your knees slightly. Try the following positions: ? Lie on your side with a pillow between your knees. ? Lie on your back with a pillow under your knees.  Lifting   When lifting objects, keep your feet at least shoulder width apart and tighten your abdominal muscles.  Bend your knees and hips and keep your spine neutral. It is important to lift using the strength of your legs, not your back. Do not lock your knees straight out.  Always ask for help to lift heavy or awkward objects. This information is not intended to replace advice given to you by your health care provider. Make sure you discuss any questions you have with your health care provider. Document Revised: 02/05/2019  Document Reviewed: 11/23/2018 Elsevier Patient Education  [employer].

## 2020-03-17 DIAGNOSIS — M419 Scoliosis, unspecified: Secondary | ICD-10-CM | POA: Insufficient documentation

## 2020-03-17 DIAGNOSIS — M19049 Primary osteoarthritis, unspecified hand: Secondary | ICD-10-CM | POA: Insufficient documentation

## 2020-03-17 MED ORDER — ZOSTER VAC RECOMB ADJUVANTED 50 MCG/0.5ML IM SUSR: 0.5000 mL | Freq: Once | INTRAMUSCULAR | 0 refills | Status: AC

## 2020-03-17 MED ORDER — TETANUS-DIPHTH-ACELL PERTUSSIS 5-2.5-18.5 LF-MCG/0.5 IM SUSP: 0.5000 mL | Freq: Once | INTRAMUSCULAR | 0 refills | Status: AC

## 2020-04-13 ENCOUNTER — Telehealth: Payer: Self-pay | Admitting: Internal Medicine

## 2020-04-13 NOTE — Telephone Encounter (Signed)
ROI faxed to request Patient records. Confirmation of fax 05/28 at 3:44 om.   ROI Sent to scan

## 2020-05-08 ENCOUNTER — Telehealth: Payer: Self-pay | Admitting: Internal Medicine

## 2020-05-08 NOTE — Telephone Encounter (Signed)
[facility] here in [place] are typically who we recommend  Also Dr. Alfredo Bach or Dr. Jonnie Finner if they did not retire   tMS

## 2020-05-08 NOTE — Telephone Encounter (Signed)
Pt is wanting to know of a chiropractor in the area. The one she normally sees is not in and she said she threw her back out and needs a recommendation. She is requesting a call back.

## 2020-05-08 NOTE — Telephone Encounter (Signed)
Please advise 

## 2020-05-09 NOTE — Telephone Encounter (Signed)
Patient informed and verbalized understanding.  Spelled the names out and Patient wrote these out.

## 2020-06-18 ENCOUNTER — Telehealth: Payer: Self-pay | Admitting: Internal Medicine

## 2020-06-18 NOTE — Telephone Encounter (Signed)
Call patient  Thank you for dropping off copy of your labs from [facility] 05/16/20 from Dr. Maude Leriche orders in [place]   D dimer 1.21 this is high  -is she having any leg pain or swelling or SOB?  If so she needs stat US b/l legs and CXR then V/Q scan of lungs given kidney function   Blood cts normal  Total cholesterol 210 and LDL 129 increased  -mail cholesterol handout  -does she want to try a statin ot lower cholesterol?   Creatinine 1.05 kidney function with GFR 51  -rec avoid nsaids hydrate with water 55-64 ounces daily  -what has her blood pressure been running?  -set up appt in 08/16/20 to check kidney function again in office   Folic acid normal   Cancer markers:  CA 125 normal  CA 15-3 normal CEA normal  AFP normal  CA 19-9 normal   DHEAS normal  Stress hormone cortisol in normal range 15.6  Testosterone normal  Estrogen hormone ok  Vitamin D 61.3 normal    A1C 5.4 normal   Thyroid labs normal  B12 1827 elevated but no harm in this  Magnesium 2.2 normal  Progesterone 5.5 not worried about this   Other labs normal insulin, ferritin, CRP, TPO thyroid antibodies, free T4, free T3 thyroid labs, sex hormone binding globulin

## 2020-06-19 NOTE — Telephone Encounter (Signed)
Patient informed and verbalized understanding.  State that she is not having any pain, swelling, or SOB. States she recently hurt her knee but that is her only issues with her legs.  Patient declines Cholesterol medication for now. Sates she is trying a supplement OTC and will read the handout sent.  Patient has not been checking her BP.  States she will check this and call back with numbers in 1-2 weeks.   Handout mailed

## 2020-07-03 NOTE — Telephone Encounter (Signed)
D dimer elevated I would work up with US doppler both legs and CT chest with dye if pt agreeable to make sure nothing with elevated D dimer  Pt agreeable?

## 2020-07-04 NOTE — Telephone Encounter (Signed)
Please advise on blood pressure.   Patient is agreeable to both imaging procedures.

## 2020-07-04 NOTE — Telephone Encounter (Signed)
Patient called in B/p  Reading for two week : lowest 105 highest 129 systolic Diastolic 56 lowest highest 64

## 2020-07-13 ENCOUNTER — Ambulatory Visit (INDEPENDENT_AMBULATORY_CARE_PROVIDER_SITE_OTHER): Payer: Medicare Other

## 2020-07-13 VITALS — Ht 62.99 in | Wt 114.0 lb

## 2020-07-13 DIAGNOSIS — Z0000 Encounter for general adult medical examination without abnormal findings: Secondary | ICD-10-CM

## 2020-07-13 NOTE — Patient Instructions (Addendum)
Lynn Powell , Thank you for taking time to come for your Medicare Wellness Visit. I appreciate your ongoing commitment to your health goals. Please review the following plan we discussed and let me know if I can assist you in the future.   These are the goals we discussed: Goals    . Maintain Healthy Lifestyle     Stay active Healthy diet       This is a list of the screening recommended for you and due dates:  Health Maintenance  Topic Date Due  . DEXA scan (bone density measurement)  07/13/2021*  .  Hepatitis C: One time screening is recommended by Center for Disease Control  (CDC) for  adults born from [birth year] through 1964.   07/13/2021*  . Pneumonia vaccines  Completed  . Flu Shot  Discontinued  . Tetanus Vaccine  Discontinued  . COVID-19 Vaccine  Discontinued  *Topic was postponed. The date shown is not the original due date.    Immunizations Immunization History  Administered Date(s) Administered  . Pneumococcal Conjugate-13 02/02/2018  . Pneumococcal Polysaccharide-23 09/14/2019   Keep all routine maintenance appointments.   Next scheduled lab 08/16/20 @ 10:00.  Follow up 09/26/20 @ 1:30.   Advanced directives:   Conditions/risks identified: none new.   Follow up in one year for your annual wellness visit.   Preventive Care 22 Years and Older, Female Preventive care refers to lifestyle choices and visits with your health care provider that can promote health and wellness. What does preventive care include?  A yearly physical exam. This is also called an annual well check.  Dental exams once or twice a year.  Routine eye exams. Ask your health care provider how often you should have your eyes checked.  Personal lifestyle choices, including:  Daily care of your teeth and gums.  Regular physical activity.  Eating a healthy diet.  Avoiding tobacco and drug use.  Limiting alcohol use.  Practicing safe sex.  Taking low-dose aspirin every  day.  Taking vitamin and mineral supplements as recommended by your health care provider. What happens during an annual well check? The services and screenings done by your health care provider during your annual well check will depend on your age, overall health, lifestyle risk factors, and family history of disease. Counseling  Your health care provider may ask you questions about your:  Alcohol use.  Tobacco use.  Drug use.  Emotional well-being.  Home and relationship well-being.  Sexual activity.  Eating habits.  History of falls.  Memory and ability to understand (cognition).  Work and work Astronomer.  Reproductive health. Screening  You may have the following tests or measurements:  Height, weight, and BMI.  Blood pressure.  Lipid and cholesterol levels. These may be checked every 5 years, or more frequently if you are over 78 years old.  Skin check.  Lung cancer screening. You may have this screening every year starting at age 40 if you have a 30-pack-year history of smoking and currently smoke or have quit within the past 15 years.  Fecal occult blood test (FOBT) of the stool. You may have this test every year starting at age 68.  Flexible sigmoidoscopy or colonoscopy. You may have a sigmoidoscopy every 5 years or a colonoscopy every 10 years starting at age 51.  Hepatitis C blood test.  Hepatitis B blood test.  Sexually transmitted disease (STD) testing.  Diabetes screening. This is done by checking your blood sugar (glucose) after you have not  eaten for a while (fasting). You may have this done every 1-3 years.  Bone density scan. This is done to screen for osteoporosis. You may have this done starting at age 109.  Mammogram. This may be done every 1-2 years. Talk to your health care provider about how often you should have regular mammograms. Talk with your health care provider about your test results, treatment options, and if necessary, the need  for more tests. Vaccines  Your health care provider may recommend certain vaccines, such as:  Influenza vaccine. This is recommended every year.  Tetanus, diphtheria, and acellular pertussis (Tdap, Td) vaccine. You may need a Td booster every 10 years.  Zoster vaccine. You may need this after age 3.  Pneumococcal 13-valent conjugate (PCV13) vaccine. One dose is recommended after age 40.  Pneumococcal polysaccharide (PPSV23) vaccine. One dose is recommended after age 35. Talk to your health care provider about which screenings and vaccines you need and how often you need them. This information is not intended to replace advice given to you by your health care provider. Make sure you discuss any questions you have with your health care provider. Document Released: 11/10/2015 Document Revised: 07/03/2016 Document Reviewed: 08/15/2015 Elsevier Interactive Patient Education  2017 [employer].  Fall Prevention in the Home Falls can cause injuries. They can happen to people of all ages. There are many things you can do to make your home safe and to help prevent falls. What can I do on the outside of my home?  Regularly fix the edges of walkways and driveways and fix any cracks.  Remove anything that might make you trip as you walk through a door, such as a raised step or threshold.  Trim any bushes or trees on the path to your home.  Use bright outdoor lighting.  Clear any walking paths of anything that might make someone trip, such as rocks or tools.  Regularly check to see if handrails are loose or broken. Make sure that both sides of any steps have handrails.  Any raised decks and porches should have guardrails on the edges.  Have any leaves, snow, or ice cleared regularly.  Use sand or salt on walking paths during winter.  Clean up any spills in your garage right away. This includes oil or grease spills. What can I do in the bathroom?  Use night lights.  Install grab bars  by the toilet and in the tub and shower. Do not use towel bars as grab bars.  Use non-skid mats or decals in the tub or shower.  If you need to sit down in the shower, use a plastic, non-slip stool.  Keep the floor dry. Clean up any water that spills on the floor as soon as it happens.  Remove soap buildup in the tub or shower regularly.  Attach bath mats securely with double-sided non-slip rug tape.  Do not have throw rugs and other things on the floor that can make you trip. What can I do in the bedroom?  Use night lights.  Make sure that you have a light by your bed that is easy to reach.  Do not use any sheets or blankets that are too big for your bed. They should not hang down onto the floor.  Have a firm chair that has side arms. You can use this for support while you get dressed.  Do not have throw rugs and other things on the floor that can make you trip. What can I  do in the kitchen?  Clean up any spills right away.  Avoid walking on wet floors.  Keep items that you use a lot in easy-to-reach places.  If you need to reach something above you, use a strong step stool that has a grab bar.  Keep electrical cords out of the way.  Do not use floor polish or wax that makes floors slippery. If you must use wax, use non-skid floor wax.  Do not have throw rugs and other things on the floor that can make you trip. What can I do with my stairs?  Do not leave any items on the stairs.  Make sure that there are handrails on both sides of the stairs and use them. Fix handrails that are broken or loose. Make sure that handrails are as long as the stairways.  Check any carpeting to make sure that it is firmly attached to the stairs. Fix any carpet that is loose or worn.  Avoid having throw rugs at the top or bottom of the stairs. If you do have throw rugs, attach them to the floor with carpet tape.  Make sure that you have a light switch at the top of the stairs and the  bottom of the stairs. If you do not have them, ask someone to add them for you. What else can I do to help prevent falls?  Wear shoes that:  Do not have high heels.  Have rubber bottoms.  Are comfortable and fit you well.  Are closed at the toe. Do not wear sandals.  If you use a stepladder:  Make sure that it is fully opened. Do not climb a closed stepladder.  Make sure that both sides of the stepladder are locked into place.  Ask someone to hold it for you, if possible.  Clearly mark and make sure that you can see:  Any grab bars or handrails.  First and last steps.  Where the edge of each step is.  Use tools that help you move around (mobility aids) if they are needed. These include:  Canes.  Walkers.  Scooters.  Crutches.  Turn on the lights when you go into a dark area. Replace any light bulbs as soon as they burn out.  Set up your furniture so you have a clear path. Avoid moving your furniture around.  If any of your floors are uneven, fix them.  If there are any pets around you, be aware of where they are.  Review your medicines with your doctor. Some medicines can make you feel dizzy. This can increase your chance of falling. Ask your doctor what other things that you can do to help prevent falls. This information is not intended to replace advice given to you by your health care provider. Make sure you discuss any questions you have with your health care provider. Document Released: 08/10/2009 Document Revised: 03/21/2016 Document Reviewed: 11/18/2014 Elsevier Interactive Patient Education  2017 [employer].

## 2020-07-13 NOTE — Progress Notes (Signed)
Subjective:   Lynn Powell is a 79 y.o. female who presents for Medicare Annual (Subsequent) preventive examination.  Review of Systems    No ROS.  Medicare Wellness Virtual Visit.    Cardiac Risk Factors include: advanced age (>62men, >90 women)     Objective:    Today's Vitals   07/13/20 1407  Weight: 114 lb (51.7 kg)  Height: 5' 2.99" (1.6 m)   Body mass index is 20.2 kg/m.  Advanced Directives 07/13/2020 08/11/2019 07/13/2019 07/01/2018  Does Patient Have a Medical Advance Directive? No No No No  Does patient want to make changes to medical advance directive? - - - Yes (MAU/Ambulatory/Procedural Areas - Information given)  Would patient like information on creating a medical advance directive? No - Patient declined - No - Patient declined -    Current Medications (verified) Outpatient Encounter Medications as of 07/13/2020  Medication Sig   amLODipine (NORVASC) 5 MG tablet Take 1 tablet (5 mg total) by mouth daily.   cyclobenzaprine (FLEXERIL) 5 MG tablet Take 1 tablet (5 mg total) by mouth at bedtime as needed for muscle spasms.   progesterone (PROMETRIUM) 200 MG capsule TAKE ONE CAPSULE BY MOUTH DAILY AT BEDTIME   No facility-administered encounter medications on file as of 07/13/2020.    Allergies (verified) Patient has no known allergies.   History: Past Medical History:  Diagnosis Date   Anemia    kid   Cataract    Constipation    COPD (chronic obstructive pulmonary disease) (HCC)    Frequent headaches    Glaucoma    Glaucoma    Hyperlipidemia    Hypertension    Pneumonia    11/2017    Postnasal drip    Past Surgical History:  Procedure Laterality Date   ABDOMINAL HYSTERECTOMY     TONSILLECTOMY     Family History  Problem Relation Age of Onset   Heart disease Mother    Heart murmur Mother        originated from typhoid disease   Stroke Mother    Stroke Father    Social History   Socioeconomic History   Marital  status: Widowed    Spouse name: Not on file   Number of children: Not on file   Years of education: Not on file   Highest education level: Not on file  Occupational History   Not on file  Tobacco Use   Smoking status: Never Smoker   Smokeless tobacco: Never Used  Vaping Use   Vaping Use: Never used  Substance and Sexual Activity   Alcohol use: No   Drug use: No   Sexual activity: Never    Partners: Male  Other Topics Concern   Not on file  Social History Narrative   3 kids    Moved from [place] to be with daughter and grandchildren    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    Social Determinants of Health   Financial Resource Strain: Low Risk    Difficulty of Paying Living Expenses: Not hard at all  Food Insecurity: No Food Insecurity   Worried About Programme researcher, broadcasting/film/video in the Last Year: Never true   Barista in the Last Year: Never true  Transportation Needs: No Transportation Needs   Lack of Transportation (Medical): No   Lack of Transportation (Non-Medical): No  Physical Activity:    Days of Exercise per Week: Not on file   Minutes of Exercise per Session: Not on file  Stress: No Stress Concern Present   Feeling of Stress : Not at all  Social Connections: Unknown   Frequency of Communication with Friends and Family: Not on file   Frequency of Social Gatherings with Friends and Family: Not on file   Attends Religious Services: Not on file   Active Member of Clubs or Organizations: Not on file   Attends Banker Meetings: Not on file   Marital Status: Widowed    Tobacco Counseling Counseling given: Not Answered   Clinical Intake:  Pre-visit preparation completed: Yes        Diabetes: No  How often do you need to have someone help you when you read instructions, pamphlets, or other written materials from your doctor or pharmacy?: 1 - Never  Interpreter Needed?: No      Activities of Daily Living In your  present state of health, do you have any difficulty performing the following activities: 07/13/2020  Hearing? N  Vision? N  Difficulty concentrating or making decisions? N  Walking or climbing stairs? N  Dressing or bathing? N  Doing errands, shopping? N  Preparing Food and eating ? N  Using the Toilet? N  In the past six months, have you accidently leaked urine? N  Do you have problems with loss of bowel control? N  Managing your Medications? N  Managing your Finances? N  Housekeeping or managing your Housekeeping? N  Some recent data might be hidden    Patient Care Team: McLean-Scocuzza, Pasty Spillers, MD as PCP - General (Internal Medicine)  Indicate any recent Medical Services you may have received from other than [facility] providers in the past year (date may be approximate).     Assessment:   This is a routine wellness examination for [name].  I connected with Lynn Powell today by telephone and verified that I am speaking with the correct person using two identifiers. Location patient: home Location provider: work Persons participating in the virtual visit: patient, Engineer, civil (consulting).    I discussed the limitations, risks, security and privacy concerns of performing an evaluation and management service by telephone and the availability of in person appointments. The patient expressed understanding and verbally consented to this telephonic visit.    Interactive audio and video telecommunications were attempted between this provider and patient, however failed, due to patient having technical difficulties OR patient did not have access to video capability.  We continued and completed visit with audio only.  Some vital signs may be absent or patient reported.   Hearing/Vision screen  Hearing Screening   125Hz  250Hz  500Hz  1000Hz  2000Hz  3000Hz  4000Hz  6000Hz  8000Hz   Right ear:           Left ear:           Comments: Patient is able to hear conversational tones without difficulty.  No issues  reported.  Vision Screening Comments: Followed [facility]  Wears corrective lenses  Glaucoma  Visual acuity not assessed per patient preference since they have regular follow up with the ophthalmologist. Virtual visit.   Dietary issues and exercise activities discussed: Current Exercise Habits: Home exercise routine, Type of exercise: walking, Intensity: Mild  Goals     Maintain Healthy Lifestyle     Stay active Healthy diet      Depression Screen PHQ 2/9 Scores 07/13/2020 03/16/2020 07/13/2019 06/01/2019 07/01/2018 07/01/2018 02/02/2018  PHQ - 2 Score 0 0 0 0 0 0 0    Fall Risk Fall Risk  07/13/2020 03/16/2020 09/14/2019 06/01/2019 07/01/2018  Falls in the past year? 0 0 1 1 Yes  Number falls in past yr: 0 - 0 0 1  Injury with Fall? - - 1 0 No  Comment - - - - -  Follow up Falls evaluation completed Falls evaluation completed - - -   Handrails in use when climbing stairs? Yes Home free of loose throw rugs in walkways, pet beds, electrical cords, etc? Yes  Adequate lighting in your home to reduce risk of falls? Yes   ASSISTIVE DEVICES UTILIZED TO PREVENT FALLS:  Life alert? No  Use of a cane, walker or w/c? No  Grab bars in the bathroom? Yes  Shower chair or bench in shower? Yes   Elevated toilet seat or a handicapped toilet? No   TIMED UP AND GO:  Was the test performed? No . Virtual visit.    Cognitive Function: Patient is alert and oriented x3.  Denies difficulty focusing, memory loss, making decisions.  Enjoys reviewing children's books for brain health.   MMSE - Mini Mental State Exam 07/01/2018  Orientation to time 5  Orientation to Place 5  Registration 3  Attention/ Calculation 5  Recall 3  Language- name 2 objects 2  Language- repeat 1  Language- follow 3 step command 3  Language- read & follow direction 1  Write a sentence 1  Copy design 1  Total score 30     6CIT Screen 07/13/2019  What Year? 0 points  What month? 0 points  What time? 0 points   Count back from 20 0 points  Months in reverse 0 points  Repeat phrase 0 points  Total Score 0    Immunizations Immunization History  Administered Date(s) Administered   Pneumococcal Conjugate-13 02/02/2018   Pneumococcal Polysaccharide-23 09/14/2019   Influenza vaccine- discontinued per patient.   Tdap- discontinued per patient.   Covid vaccine- discontinued per patient.  Health Maintenance Health Maintenance  Topic Date Due   DEXA SCAN  07/13/2021 (Originally 12/17/2006)   Hepatitis C Screening  07/13/2021 (Originally February 17, 1941)   PNA vac Low Risk Adult  Completed   INFLUENZA VACCINE  Discontinued   TETANUS/TDAP  Discontinued   COVID-19 Vaccine  Discontinued    Dexa Scan- deferred per patient until later in the season.   Hepatitis C Screening- deferred until later in the season per patient.   Dental Screening: Recommended annual dental exams for proper oral hygiene.  Community Resource Referral / Chronic Care Management: CRR required this visit?  No   CCM required this visit?  No      Plan:   Keep all routine maintenance appointments.   Next scheduled lab 08/16/20 @ 10:00.  Follow up 09/26/20 @ 1:30.   I have personally reviewed and noted the following in the patients chart:    Medical and social history  Use of alcohol, tobacco or illicit drugs   Current medications and supplements  Functional ability and status  Nutritional status  Physical activity  Advanced directives  List of other physicians  Hospitalizations, surgeries, and ER visits in previous 12 months  Vitals  Screenings to include cognitive, depression, and falls  Referrals and appointments  In addition, I have reviewed and discussed with patient certain preventive protocols, quality metrics, and best practice recommendations. A written personalized care plan for preventive services as well as general preventive health recommendations were provided to patient via  mychart.      Ashok Pall, LPN   [id number]/60/[id number]

## 2020-07-24 ENCOUNTER — Telehealth: Payer: Self-pay | Admitting: Internal Medicine

## 2020-07-24 NOTE — Telephone Encounter (Signed)
Dr Shirlee Latch did you get my msg about pt?   I spoke with pt to sch the CT pt states she's not having a problem now and if she starts having a problem she will let you know.

## 2020-07-24 NOTE — Telephone Encounter (Signed)
Patient informed and verbalized understanding.  New encounter from today re-sent for Patient to be scheduled for imaging

## 2020-07-24 NOTE — Telephone Encounter (Signed)
I am ok with BP readings continue same dose of BP medication   Arianna  -->Sch BMET before CT chest and [place] legs to work up elevated D dimer  -make sure she drinks 55-64 ounces of water daily esp impt before and after test    Rasheedah  -->Please sch CT chest and [place] legs thanks to you both

## 2020-07-24 NOTE — Telephone Encounter (Signed)
I spoke with pt to sch the CT pt states she's not having a problem now and if she starts having a problem she will let you know. Pt wants to hold off for now.

## 2020-07-24 NOTE — Telephone Encounter (Signed)
It is not about pt having a problem its to work up elevated D dimer Part of the work up is [place] legs and CT chest   Thanks TMS

## 2020-07-24 NOTE — Telephone Encounter (Signed)
Spoke with the Patient to explain and she is okay to schedule both imaging orders

## 2020-07-24 NOTE — Addendum Note (Signed)
Addended by: Quentin Ore on: 07/24/2020 02:21 PM   Modules accepted: Orders

## 2020-07-24 NOTE — Telephone Encounter (Signed)
This is to work her up for elevated D dimer checked by another provider  CT chest and [place] legs  Part of the work up for elevated d dimer

## 2020-07-25 ENCOUNTER — Telehealth: Payer: Self-pay | Admitting: Internal Medicine

## 2020-07-25 NOTE — Telephone Encounter (Signed)
Noted  

## 2020-07-25 NOTE — Telephone Encounter (Signed)
Pt is scheduled on 08/08/2020 at 10:15 am.

## 2020-07-25 NOTE — Telephone Encounter (Signed)
Pt will need Bun and creatine done at appt on 08/08/2020 at 10:15 am. Orders needed please and Thank you!

## 2020-07-25 NOTE — Telephone Encounter (Signed)
Ok. Thank you.

## 2020-07-25 NOTE — Telephone Encounter (Signed)
Patient was informed of this yesterday and is agreeable to do imaging.

## 2020-07-25 NOTE — Telephone Encounter (Signed)
Patient scheduled for labs. 08/01/20.  Patient states she was only scheduled for CT. Was wondering if they could also do her Ultrasound that day?

## 2020-07-25 NOTE — Telephone Encounter (Signed)
Per scheduling pt has to have bun and creatine labs can the order be added and they will do labs before the CT the [place] has been added to that day.

## 2020-07-25 NOTE — Telephone Encounter (Signed)
Was notified that Patient is aware of [place] and CT scheduled along with labs in our office 08/01/20.

## 2020-07-31 ENCOUNTER — Telehealth: Payer: Self-pay | Admitting: *Deleted

## 2020-07-31 ENCOUNTER — Other Ambulatory Visit: Payer: Self-pay | Admitting: Internal Medicine

## 2020-07-31 ENCOUNTER — Ambulatory Visit: Payer: Medicare Other

## 2020-07-31 DIAGNOSIS — R7989 Other specified abnormal findings of blood chemistry: Secondary | ICD-10-CM

## 2020-07-31 NOTE — Telephone Encounter (Signed)
Please place future orders for lab appt.  

## 2020-07-31 NOTE — Telephone Encounter (Signed)
Want to do BMET with GFR prior to CT scan chest   Please cancel 08/16/20 appt for labs inform pt as well cancelled   Thank you

## 2020-08-01 ENCOUNTER — Other Ambulatory Visit: Payer: Self-pay

## 2020-08-01 ENCOUNTER — Other Ambulatory Visit (INDEPENDENT_AMBULATORY_CARE_PROVIDER_SITE_OTHER): Payer: Medicare Other

## 2020-08-01 DIAGNOSIS — R7989 Other specified abnormal findings of blood chemistry: Secondary | ICD-10-CM | POA: Diagnosis not present

## 2020-08-01 LAB — BASIC METABOLIC PANEL
BUN: 20 mg/dL (ref 6–23)
CO2: 30 mEq/L (ref 19–32)
Calcium: 9.4 mg/dL (ref 8.4–10.5)
Chloride: 103 mEq/L (ref 96–112)
Creatinine, Ser: 1.02 mg/dL (ref 0.40–1.20)
GFR: 52.41 mL/min — ABNORMAL LOW (ref >60.00)
Glucose, Bld: 90 mg/dL (ref 70–99)
Potassium: 3.7 mEq/L (ref 3.5–5.1)
Sodium: 140 mEq/L (ref 135–145)

## 2020-08-01 NOTE — Telephone Encounter (Signed)
Pt had BMET drawn this morning.

## 2020-08-08 ENCOUNTER — Ambulatory Visit: Payer: Medicare Other

## 2020-08-16 ENCOUNTER — Other Ambulatory Visit: Payer: Medicare Other

## 2020-08-29 ENCOUNTER — Other Ambulatory Visit: Payer: Self-pay

## 2020-08-29 ENCOUNTER — Telehealth: Payer: Self-pay | Admitting: Internal Medicine

## 2020-08-29 DIAGNOSIS — J0111 Acute recurrent frontal sinusitis: Secondary | ICD-10-CM

## 2020-08-29 MED ORDER — FLUTICASONE PROPIONATE 50 MCG/ACT NA SUSP: 2.0000 | Freq: Every day | NASAL | 11 refills | Status: DC

## 2020-08-29 NOTE — Telephone Encounter (Signed)
Pt needs a refill on fluticasone (FLONASE) 50 MCG/ACT nasal spray sent to [facility]

## 2020-09-26 ENCOUNTER — Ambulatory Visit (INDEPENDENT_AMBULATORY_CARE_PROVIDER_SITE_OTHER): Payer: Medicare Other | Admitting: Internal Medicine

## 2020-09-26 ENCOUNTER — Other Ambulatory Visit: Payer: Self-pay

## 2020-09-26 ENCOUNTER — Encounter: Payer: Self-pay | Admitting: Internal Medicine

## 2020-09-26 VITALS — BP 122/70 | HR 91 | Temp 98.6°F | Ht 63.0 in | Wt 110.4 lb

## 2020-09-26 DIAGNOSIS — S76311A Strain of muscle, fascia and tendon of the posterior muscle group at thigh level, right thigh, initial encounter: Secondary | ICD-10-CM

## 2020-09-26 DIAGNOSIS — M47814 Spondylosis without myelopathy or radiculopathy, thoracic region: Secondary | ICD-10-CM | POA: Diagnosis not present

## 2020-09-26 DIAGNOSIS — J329 Chronic sinusitis, unspecified: Secondary | ICD-10-CM

## 2020-09-26 DIAGNOSIS — M5030 Other cervical disc degeneration, unspecified cervical region: Secondary | ICD-10-CM

## 2020-09-26 DIAGNOSIS — G4700 Insomnia, unspecified: Secondary | ICD-10-CM

## 2020-09-26 DIAGNOSIS — T22112A Burn of first degree of left forearm, initial encounter: Secondary | ICD-10-CM

## 2020-09-26 DIAGNOSIS — M25561 Pain in right knee: Secondary | ICD-10-CM

## 2020-09-26 DIAGNOSIS — J309 Allergic rhinitis, unspecified: Secondary | ICD-10-CM

## 2020-09-26 NOTE — Patient Instructions (Addendum)
Voltaren gel 4x per day  Salonpas pain patch or lidocaine pain patch   L theanine 100-200 mg at night for sleep  -->nature made  Stress relax brand Tranquil sleep (will have sugar)   Tylenol  Let me know about Xray right knee, physical therapy and if you want tramadol for pain or trazodone    Journal for Nurse Practitioners, 15(4), 542-706. Retrieved August 03, 2018 from [email or web address].com/[email or web address]">  Knee Exercises Ask your health care provider which exercises are safe for you. Do exercises exactly as told by your health care provider and adjust them as directed. It is normal to feel mild stretching, pulling, tightness, or discomfort as you do these exercises. Stop right away if you feel sudden pain or your pain gets worse. Do not begin these exercises until told by your health care provider. Stretching and range-of-motion exercises These exercises warm up your muscles and joints and improve the movement and flexibility of your knee. These exercises also help to relieve pain and swelling. Knee extension, prone 1. Lie on your abdomen (prone position) on a bed. 2. Place your left / right knee just beyond the edge of the surface so your knee is not on the bed. You can put a towel under your left / right thigh just above your kneecap for comfort. 3. Relax your leg muscles and allow gravity to straighten your knee (extension). You should feel a stretch behind your left / right knee. 4. Hold this position for __________ seconds. 5. Scoot up so your knee is supported between repetitions. Repeat __________ times. Complete this exercise __________ times a day. Knee flexion, active  1. Lie on your back with both legs straight. If this causes back discomfort, bend your left / right knee so your foot is flat on the floor. 2. Slowly slide your left / right heel back toward your buttocks. Stop when you feel a gentle stretch in the front of your knee or thigh (flexion). 3. Hold this position for  __________ seconds. 4. Slowly slide your left / right heel back to the starting position. Repeat __________ times. Complete this exercise __________ times a day. Quadriceps stretch, prone  1. Lie on your abdomen on a firm surface, such as a bed or padded floor. 2. Bend your left / right knee and hold your ankle. If you cannot reach your ankle or pant leg, loop a belt around your foot and grab the belt instead. 3. Gently pull your heel toward your buttocks. Your knee should not slide out to the side. You should feel a stretch in the front of your thigh and knee (quadriceps). 4. Hold this position for __________ seconds. Repeat __________ times. Complete this exercise __________ times a day. Hamstring, supine 1. Lie on your back (supine position). 2. Loop a belt or towel over the ball of your left / right foot. The ball of your foot is on the walking surface, right under your toes. 3. Straighten your left / right knee and slowly pull on the belt to raise your leg until you feel a gentle stretch behind your knee (hamstring). ? Do not let your knee bend while you do this. ? Keep your other leg flat on the floor. 4. Hold this position for __________ seconds. Repeat __________ times. Complete this exercise __________ times a day. Strengthening exercises These exercises build strength and endurance in your knee. Endurance is the ability to use your muscles for a long time, even after they get tired. Quadriceps, isometric This exercise stretches  the muscles in front of your thigh (quadriceps) without moving your knee joint (isometric). 1. Lie on your back with your left / right leg extended and your other knee bent. Put a rolled towel or small pillow under your knee if told by your health care provider. 2. Slowly tense the muscles in the front of your left / right thigh. You should see your kneecap slide up toward your hip or see increased dimpling just above the knee. This motion will push the back  of the knee toward the floor. 3. For __________ seconds, hold the muscle as tight as you can without increasing your pain. 4. Relax the muscles slowly and completely. Repeat __________ times. Complete this exercise __________ times a day. Straight leg raises This exercise stretches the muscles in front of your thigh (quadriceps) and the muscles that move your hips (hip flexors). 1. Lie on your back with your left / right leg extended and your other knee bent. 2. Tense the muscles in the front of your left / right thigh. You should see your kneecap slide up or see increased dimpling just above the knee. Your thigh may even shake a bit. 3. Keep these muscles tight as you raise your leg 4-6 inches (10-15 cm) off the floor. Do not let your knee bend. 4. Hold this position for __________ seconds. 5. Keep these muscles tense as you lower your leg. 6. Relax your muscles slowly and completely after each repetition. Repeat __________ times. Complete this exercise __________ times a day. Hamstring, isometric 1. Lie on your back on a firm surface. 2. Bend your left / right knee about __________ degrees. 3. Dig your left / right heel into the surface as if you are trying to pull it toward your buttocks. Tighten the muscles in the back of your thighs (hamstring) to "dig" as hard as you can without increasing any pain. 4. Hold this position for __________ seconds. 5. Release the tension gradually and allow your muscles to relax completely for __________ seconds after each repetition. Repeat __________ times. Complete this exercise __________ times a day. Hamstring curls If told by your health care provider, do this exercise while wearing ankle weights. Begin with __________ lb weights. Then increase the weight by 1 lb (0.5 kg) increments. Do not wear ankle weights that are more than __________ lb. 1. Lie on your abdomen with your legs straight. 2. Bend your left / right knee as far as you can without  feeling pain. Keep your hips flat against the floor. 3. Hold this position for __________ seconds. 4. Slowly lower your leg to the starting position. Repeat __________ times. Complete this exercise __________ times a day. Squats This exercise strengthens the muscles in front of your thigh and knee (quadriceps). 1. Stand in front of a table, with your feet and knees pointing straight ahead. You may rest your hands on the table for balance but not for support. 2. Slowly bend your knees and lower your hips like you are going to sit in a chair. ? Keep your weight over your heels, not over your toes. ? Keep your lower legs upright so they are parallel with the table legs. ? Do not let your hips go lower than your knees. ? Do not bend lower than told by your health care provider. ? If your knee pain increases, do not bend as low. 3. Hold the squat position for __________ seconds. 4. Slowly push with your legs to return to standing. Do not use  your hands to pull yourself to standing. Repeat __________ times. Complete this exercise __________ times a day. Wall slides This exercise strengthens the muscles in front of your thigh and knee (quadriceps). 1. Lean your back against a smooth wall or door, and walk your feet out 18-24 inches (46-61 cm) from it. 2. Place your feet hip-width apart. 3. Slowly slide down the wall or door until your knees bend __________ degrees. Keep your knees over your heels, not over your toes. Keep your knees in line with your hips. 4. Hold this position for __________ seconds. Repeat __________ times. Complete this exercise __________ times a day. Straight leg raises This exercise strengthens the muscles that rotate the leg at the hip and move it away from your body (hip abductors). 1. Lie on your side with your left / right leg in the top position. Lie so your head, shoulder, knee, and hip line up. You may bend your bottom knee to help you keep your balance. 2. Roll your  hips slightly forward so your hips are stacked directly over each other and your left / right knee is facing forward. 3. Leading with your heel, lift your top leg 4-6 inches (10-15 cm). You should feel the muscles in your outer hip lifting. ? Do not let your foot drift forward. ? Do not let your knee roll toward the ceiling. 4. Hold this position for __________ seconds. 5. Slowly return your leg to the starting position. 6. Let your muscles relax completely after each repetition. Repeat __________ times. Complete this exercise __________ times a day. Straight leg raises This exercise stretches the muscles that move your hips away from the front of the pelvis (hip extensors). 1. Lie on your abdomen on a firm surface. You can put a pillow under your hips if that is more comfortable. 2. Tense the muscles in your buttocks and lift your left / right leg about 4-6 inches (10-15 cm). Keep your knee straight as you lift your leg. 3. Hold this position for __________ seconds. 4. Slowly lower your leg to the starting position. 5. Let your leg relax completely after each repetition. Repeat __________ times. Complete this exercise __________ times a day. This information is not intended to replace advice given to you by your health care provider. Make sure you discuss any questions you have with your health care provider. Document Revised: 08/04/2018 Document Reviewed: 08/04/2018 Elsevier Patient Education  2020 [employer]  Insomnia Insomnia is a sleep disorder that makes it difficult to fall asleep or stay asleep. Insomnia can cause fatigue, low energy, difficulty concentrating, mood swings, and poor performance at work or school. There are three different ways to classify insomnia:  Difficulty falling asleep.  Difficulty staying asleep.  Waking up too early in the morning. Any type of insomnia can be long-term (chronic) or short-term (acute). Both are common. Short-term insomnia usually lasts  for three months or less. Chronic insomnia occurs at least three times a week for longer than three months. What are the causes? Insomnia may be caused by another condition, situation, or substance, such as:  Anxiety.  Certain medicines.  Gastroesophageal reflux disease (GERD) or other gastrointestinal conditions.  Asthma or other breathing conditions.  Restless legs syndrome, sleep apnea, or other sleep disorders.  Chronic pain.  Menopause.  Stroke.  Abuse of alcohol, tobacco, or illegal drugs.  Mental health conditions, such as depression.  Caffeine.  Neurological disorders, such as Alzheimer's disease.  An overactive thyroid (hyperthyroidism). Sometimes, the cause of  insomnia may not be known. What increases the risk? Risk factors for insomnia include:  Gender. Women are affected more often than men.  Age. Insomnia is more common as you get older.  Stress.  Lack of exercise.  Irregular work schedule or working night shifts.  Traveling between different time zones.  Certain medical and mental health conditions. What are the signs or symptoms? If you have insomnia, the main symptom is having trouble falling asleep or having trouble staying asleep. This may lead to other symptoms, such as:  Feeling fatigued or having low energy.  Feeling nervous about going to sleep.  Not feeling rested in the morning.  Having trouble concentrating.  Feeling irritable, anxious, or depressed. How is this diagnosed? This condition may be diagnosed based on:  Your symptoms and medical history. Your health care provider may ask about: ? Your sleep habits. ? Any medical conditions you have. ? Your mental health.  A physical exam. How is this treated? Treatment for insomnia depends on the cause. Treatment may focus on treating an underlying condition that is causing insomnia. Treatment may also include:  Medicines to help you sleep.  Counseling or  therapy.  Lifestyle adjustments to help you sleep better. Follow these instructions at home: Eating and drinking   Limit or avoid alcohol, caffeinated beverages, and cigarettes, especially close to bedtime. These can disrupt your sleep.  Do not eat a large meal or eat spicy foods right before bedtime. This can lead to digestive discomfort that can make it hard for you to sleep. Sleep habits   Keep a sleep diary to help you and your health care provider figure out what could be causing your insomnia. Write down: ? When you sleep. ? When you wake up during the night. ? How well you sleep. ? How rested you feel the next day. ? Any side effects of medicines you are taking. ? What you eat and drink.  Make your bedroom a dark, comfortable place where it is easy to fall asleep. ? Put up shades or blackout curtains to block light from outside. ? Use a white noise machine to block noise. ? Keep the temperature cool.  Limit screen use before bedtime. This includes: ? Watching TV. ? Using your smartphone, tablet, or computer.  Stick to a routine that includes going to bed and waking up at the same times every day and night. This can help you fall asleep faster. Consider making a quiet activity, such as reading, part of your nighttime routine.  Try to avoid taking naps during the day so that you sleep better at night.  Get out of bed if you are still awake after 15 minutes of trying to sleep. Keep the lights down, but try reading or doing a quiet activity. When you feel sleepy, go back to bed. General instructions  Take over-the-counter and prescription medicines only as told by your health care provider.  Exercise regularly, as told by your health care provider. Avoid exercise starting several hours before bedtime.  Use relaxation techniques to manage stress. Ask your health care provider to suggest some techniques that may work well for you. These may include: ? Breathing  exercises. ? Routines to release muscle tension. ? Visualizing peaceful scenes.  Make sure that you drive carefully. Avoid driving if you feel very sleepy.  Keep all follow-up visits as told by your health care provider. This is important. Contact a health care provider if:  You are tired throughout the day.  You have trouble in your daily routine due to sleepiness.  You continue to have sleep problems, or your sleep problems get worse. Get help right away if:  You have serious thoughts about hurting yourself or someone else. If you ever feel like you may hurt yourself or others, or have thoughts about taking your own life, get help right away. You can go to your nearest emergency department or call:  Your local emergency services (911 in the [place]  A suicide crisis helpline, such as the National Suicide Prevention Lifeline at (469)885-3956. This is open 24 hours a day. Summary  Insomnia is a sleep disorder that makes it difficult to fall asleep or stay asleep.  Insomnia can be long-term (chronic) or short-term (acute).  Treatment for insomnia depends on the cause. Treatment may focus on treating an underlying condition that is causing insomnia.  Keep a sleep diary to help you and your health care provider figure out what could be causing your insomnia. This information is not intended to replace advice given to you by your health care provider. Make sure you discuss any questions you have with your health care provider. Document Revised: 09/26/2017 Document Reviewed: 07/24/2017 Elsevier Patient Education  2020 [employer].  Trazodone tablets What is this medicine? TRAZODONE (TRAZ oh done) is used to treat depression. This medicine may be used for other purposes; ask your health care provider or pharmacist if you have questions. COMMON BRAND NAME(S): Desyrel What should I tell my health care provider before I take this medicine? They need to know if you have any of these  conditions:  attempted suicide or thinking about it  bipolar disorder  bleeding problems  glaucoma  heart disease, or previous heart attack  irregular heart beat  kidney or liver disease  low levels of sodium in the blood  an unusual or allergic reaction to trazodone, other medicines, foods, dyes or preservatives  pregnant or trying to get pregnant  breast-feeding How should I use this medicine? Take this medicine by mouth with a glass of water. Follow the directions on the prescription label. Take this medicine shortly after a meal or a light snack. Take your medicine at regular intervals. Do not take your medicine more often than directed. Do not stop taking this medicine suddenly except upon the advice of your doctor. Stopping this medicine too quickly may cause serious side effects or your condition may worsen. A special MedGuide will be given to you by the pharmacist with each prescription and refill. Be sure to read this information carefully each time. Talk to your pediatrician regarding the use of this medicine in children. Special care may be needed. Overdosage: If you think you have taken too much of this medicine contact a poison control center or emergency room at once. NOTE: This medicine is only for you. Do not share this medicine with others. What if I miss a dose? If you miss a dose, take it as soon as you can. If it is almost time for your next dose, take only that dose. Do not take double or extra doses. What may interact with this medicine? Do not take this medicine with any of the following medications:  certain medicines for fungal infections like fluconazole, itraconazole, ketoconazole, posaconazole, voriconazole  cisapride  dronedarone  linezolid  MAOIs like Carbex, Eldepryl, Marplan, Nardil, and Parnate  mesoridazine  methylene blue (injected into a vein)  pimozide  saquinavir  thioridazine This medicine may also interact with the following  medications:  alcohol  antiviral medicines for HIV or AIDS  aspirin and aspirin-like medicines  barbiturates like phenobarbital  certain medicines for blood pressure, heart disease, irregular heart beat  certain medicines for depression, anxiety, or psychotic disturbances  certain medicines for migraine headache like almotriptan, eletriptan, frovatriptan, naratriptan, rizatriptan, sumatriptan, zolmitriptan  certain medicines for seizures like carbamazepine and phenytoin  certain medicines for sleep  certain medicines that treat or prevent blood clots like dalteparin, enoxaparin, warfarin  digoxin  fentanyl  lithium  NSAIDS, medicines for pain and inflammation, like ibuprofen or naproxen  other medicines that prolong the QT interval (cause an abnormal heart rhythm) like dofetilide  rasagiline  supplements like St. John's wort, kava kava, valerian  tramadol  tryptophan This list may not describe all possible interactions. Give your health care provider a list of all the medicines, herbs, non-prescription drugs, or dietary supplements you use. Also tell them if you smoke, drink alcohol, or use illegal drugs. Some items may interact with your medicine. What should I watch for while using this medicine? Tell your doctor if your symptoms do not get better or if they get worse. Visit your doctor or health care professional for regular checks on your progress. Because it may take several weeks to see the full effects of this medicine, it is important to continue your treatment as prescribed by your doctor. Patients and their families should watch out for new or worsening thoughts of suicide or depression. Also watch out for sudden changes in feelings such as feeling anxious, agitated, panicky, irritable, hostile, aggressive, impulsive, severely restless, overly excited and hyperactive, or not being able to sleep. If this happens, especially at the beginning of treatment or after a  change in dose, call your health care professional. Bonita Quin may get drowsy or dizzy. Do not drive, use machinery, or do anything that needs mental alertness until you know how this medicine affects you. Do not stand or sit up quickly, especially if you are an older patient. This reduces the risk of dizzy or fainting spells. Alcohol may interfere with the effect of this medicine. Avoid alcoholic drinks. This medicine may cause dry eyes and blurred vision. If you wear contact lenses you may feel some discomfort. Lubricating drops may help. See your eye doctor if the problem does not go away or is severe. Your mouth may get dry. Chewing sugarless gum, sucking hard candy and drinking plenty of water may help. Contact your doctor if the problem does not go away or is severe. What side effects may I notice from receiving this medicine? Side effects that you should report to your doctor or health care professional as soon as possible:  allergic reactions like skin rash, itching or hives, swelling of the face, lips, or tongue  elevated mood, decreased need for sleep, racing thoughts, impulsive behavior  confusion  fast, irregular heartbeat  feeling faint or lightheaded, falls  feeling agitated, angry, or irritable  loss of balance or coordination  painful or prolonged erections  restlessness, pacing, inability to keep still  suicidal thoughts or other mood changes  tremors  trouble sleeping  seizures  unusual bleeding or bruising Side effects that usually do not require medical attention (report to your doctor or health care professional if they continue or are bothersome):  change in sex drive or performance  change in appetite or weight  constipation  headache  muscle aches or pains  nausea This list may not describe all possible side effects. Call your doctor for medical  advice about side effects. You may report side effects to FDA at 1-800-FDA-1088. Where should I keep my  medicine? Keep out of the reach of children. Store at room temperature between 15 and 30 degrees C (59 to 86 degrees F). Protect from light. Keep container tightly closed. Throw away any unused medicine after the expiration date. NOTE: This sheet is a summary. It may not cover all possible information. If you have questions about this medicine, talk to your doctor, pharmacist, or health care provider.  2020 Elsevier/Gold Standard (2018-10-06 11:46:46)  Hamstring Strain Rehab Ask your health care provider which exercises are safe for you. Do exercises exactly as told by your health care provider and adjust them as directed. It is normal to feel mild stretching, pulling, tightness, or discomfort as you do these exercises. Stop right away if you feel sudden pain or your pain gets worse. Do not begin these exercises until told by your health care provider. Stretching and range-of-motion exercises These exercises warm up your muscles and joints and improve the movement and flexibility of your thighs. These exercises also help to relieve pain, numbness, and tingling. Talk to your health care provider about these restrictions. Knee extension, seated  1. Sit with your left / right heel propped on a chair, a coffee table, or a footstool. Do not have anything under your knee to support it. 2. Allow your leg muscles to relax, letting gravity straighten out your knee (extension). You should feel a stretch behind your left / right knee. 3. If told by your health care provider, deepen the stretch by placing a __________ weight on your thigh, just above your kneecap. 4. Hold this position for __________ seconds. Repeat __________ times. Complete this exercise __________ times a day. Seated stretch This exercise is sometimes called hamstrings and adductors stretch. 1. Sit on the floor with your legs stretched wide. Keep your knees straight during this exercise. 2. Keeping your head and back in a straight line,  bend at your waist to reach for your left foot (position A). You should feel a stretch in your right inner thigh (adductors). 3. Hold this position for __________ seconds. Then slowly return to the upright position. 4. Keeping your head and back in a straight line, bend at your waist to reach forward (position B). You should feel a stretch behind both of your thighs or knees (hamstrings). 5. Hold this position for __________ seconds. Then slowly return to the upright position. 6. Keeping your head and back in a straight line, bend at your waist to reach for your right foot (position C). You should feel a stretch in your left inner thigh (adductors). 7. Hold this position for __________ seconds. Then slowly return to the upright position. Repeat __________ times. Complete this exercise __________ times a day. Hamstrings stretch, supine  1. Lie on your back (supine position). 2. Loop a belt or towel over the ball of your left / right foot. The ball of your foot is on the walking surface, right under your toes. 3. Straighten your left / right knee and slowly pull on the belt or towel to raise your leg. ? Do not let your left / right knee bend while you do this. ? Keep your other leg flat on the floor. ? Raise the left / right leg until you feel a gentle stretch behind your left / right knee or thigh (hamstrings). 4. Hold this position for __________ seconds. 5. Slowly return your leg to the starting position.  Repeat __________ times. Complete this exercise __________ times a day. Strengthening exercises These exercises build strength and endurance in your thighs. Endurance is the ability to use your muscles for a long time, even after they get tired. Straight leg raises, prone This exercise strengthens the muscles that move the hips (hip extensors). 1. Lie on your abdomen on a firm surface (prone position). 2. Tense the muscles in your buttocks and lift your left / right leg about 4 inches (10  cm). Keep your knee straight as you lift your leg. If you cannot lift your leg that high without arching your back, place a pillow under your hips. 3. Hold the position for __________ seconds. 4. Slowly lower your leg to the starting position. 5. Allow your muscles to relax completely before you start the next repetition. Repeat __________ times. Complete this exercise __________ times a day. Bridge This exercise strengthens the muscles in your buttocks and the back of your thighs (hip extensors). 1. Lie on your back on a firm surface with your knees bent and your feet flat on the floor. 2. Tighten your buttocks muscles and lift your bottom off the floor until the trunk of your body is level with your thighs. ? You should feel the muscles working in your buttocks and the back of your thighs. ? Do not arch your back. 3. Hold this position for __________ seconds. 4. Slowly lower your hips to the starting position. 5. Let your buttocks muscles relax completely between repetitions. 6. If told by your health care provider, keep your bottom lifted off the floor while you slowly walk your feet away from you as far as you can control. Hold for __________ seconds, then slowly walk your feet back toward you. Repeat __________ times. Complete this exercise __________ times a day. Lateral walking with band This is an exercise in which you walk sideways (lateral), with tension provided by an exercise band. The exercise strengthens the muscles in your hip (hip abductors). 1. Stand in a long hallway. 2. Wrap a loop of exercise band around your legs, just above your knees. 3. Bend your knees gently and drop your hips down and back so your weight is over your heels. 4. Step to the side to move down the length of the hallway, keeping your toes pointed ahead of you and keeping tension in the band. 5. Repeat, leading with your other leg. Repeat __________ times. Complete this exercise __________ times a  day. Single leg stand with reaching This exercise is also called eccentric hamstring stretch. 1. Stand on your left / right foot. Keep your big toe down on the floor and try to keep your arch lifted. 2. Slowly reach down toward the floor as far as you can while keeping your balance. Lowering your thigh under tension is called eccentric stretching. 3. Hold this position for __________ seconds. Repeat __________ times. Complete this exercise __________ times a day. Plank, prone This exercise strengthens muscles in your abdomen and core area. 1. Lie on your abdomen on the floor (prone position),and prop yourself up on your elbows. Your hands should be straight out in front of you, and your elbows should be below your shoulders. Position your feet similar to a push-up position so your toes are on the ground. 2. Tighten your abdominal muscles and lift your body off the floor. ? Do not arch your back. ? Do not hold your breath. 3. Hold this position for __________ seconds. Repeat __________ times. Complete this exercise __________  times a day. This information is not intended to replace advice given to you by your health care provider. Make sure you discuss any questions you have with your health care provider. Document Revised: 02/04/2019 Document Reviewed: 10/12/2018 [employer] Patient Education  2020 [employer]  Quadriceps Strain Rehab Ask your health care provider which exercises are safe for you. Do exercises exactly as told by your health care provider and adjust them as directed. It is normal to feel mild stretching, pulling, tightness, or discomfort as you do these exercises. Stop right away if you feel sudden pain or your pain gets worse. Do not begin these exercises until told by your health care provider. Stretching and range-of-motion exercises These exercises warm up your muscles and joints and improve the movement and flexibility of your thigh. These exercises can also help to relieve  stiffness or swelling. Heel slides  6. Lie on your back with both legs straight. If this causes back discomfort, bend the knee of your healthy leg so your foot is flat on the floor. 7. Slowly slide your left / right heel back toward your buttocks. Stop when you feel a gentle stretch in the front of your knee or thigh (quadriceps). 8. Hold this position for __________ seconds. 9. Slowly slide your left / right heel back to the starting position. Repeat __________ times. Complete this exercise __________ times a day. Quadriceps stretch, prone  5. Lie on your abdomen on a firm surface, such as a bed or padded floor (prone position). 6. Bend your left / right knee and hold your ankle. If you cannot reach your ankle or pant leg, loop a belt around your foot and grab the belt instead. 7. Gently pull your heel toward your buttocks. Your knee should not slide out to the side. You should feel a stretch in the front of your thigh and knee (quadriceps). 8. Hold this position for __________ seconds. Repeat __________ times. Complete this exercise __________ times a day. Strengthening exercises These exercises build strength and endurance in your thigh. Endurance is the ability to use your muscles for a long time, even after your muscles get tired. Straight leg raises, supine This exercise stretches the muscles in front of your thigh (quadriceps) and the muscles that move your hips (hip flexors). Quality counts! Watch for signs that the quadriceps muscle is working to ensure that you are strengthening the correct muscles and not cheating by using healthier muscles. 5. Lie on your back (supine position) with your left / right leg extended and your other knee bent. 6. Tense the muscles in the front of your left / right thigh. You should see your kneecap slide up or see increased dimpling just above the knee. 7. Tighten these muscles even more and raise your leg 4-6 inches (10-15 cm) off the floor. 8. Hold  this position for __________ seconds. 9. Keep the thigh muscles tense as you lower your leg. 10. Relax the muscles slowly and completely after each repetition. Repeat __________ times. Complete this exercise __________ times a day. Leg raises, prone This exercise strengthens the muscles that move the hips (hip extensors). 5. Lie on your abdomen on a bed or a firm surface (prone position). Place a pillow under your hips. 6. Bend your left / right knee so your foot is straight up in the air. 7. Squeeze your buttocks muscles and lift your left / right thigh off the bed. Do not let your back arch. 8. Hold this position for __________ seconds.  9. Slowly return to the starting position. Let your muscles relax completely before doing another repetition. Repeat __________ times. Complete this exercise __________ times a day. Wall sits Follow the directions for form closely. Knee pain can occur if your feet or knees are not placed properly. 5. Lean your back against a smooth wall or door, and walk your feet out 18-24 inches (46-61 cm) from it. 6. Place your feet hip-width apart. 7. Slowly slide down the wall or door until your knees bend __________ degrees. Keep your weight back and over your heels, not over your toes. Keep your thighs straight or pointing slightly outward. 8. Hold this position for __________ seconds. 9. Use your thigh and buttocks muscles to push yourself back up to a standing position. Keep your weight through your heels while you do this. 10. Rest for __________ seconds after each repetition. Repeat __________ times. Complete this exercise __________ times a day. This information is not intended to replace advice given to you by your health care provider. Make sure you discuss any questions you have with your health care provider. Document Revised: 02/05/2019 Document Reviewed: 08/06/2018 [employer] Patient Education  2020 [employer]   Back Exercises The following exercises  strengthen the muscles that help to support the trunk and back. They also help to keep the lower back flexible. Doing these exercises can help to prevent back pain or lessen existing pain.  If you have back pain or discomfort, try doing these exercises 2-3 times each day or as told by your health care provider.  As your pain improves, do them once each day, but increase the number of times that you repeat the steps for each exercise (do more repetitions).  To prevent the recurrence of back pain, continue to do these exercises once each day or as told by your health care provider. Do exercises exactly as told by your health care provider and adjust them as directed. It is normal to feel mild stretching, pulling, tightness, or discomfort as you do these exercises, but you should stop right away if you feel sudden pain or your pain gets worse. Exercises Single knee to chest Repeat these steps 3-5 times for each leg: 1. Lie on your back on a firm bed or the floor with your legs extended. 2. Bring one knee to your chest. Your other leg should stay extended and in contact with the floor. 3. Hold your knee in place by grabbing your knee or thigh with both hands and hold. 4. Pull on your knee until you feel a gentle stretch in your lower back or buttocks. 5. Hold the stretch for 10-30 seconds. 6. Slowly release and straighten your leg. Pelvic tilt Repeat these steps 5-10 times: 1. Lie on your back on a firm bed or the floor with your legs extended. 2. Bend your knees so they are pointing toward the ceiling and your feet are flat on the floor. 3. Tighten your lower abdominal muscles to press your lower back against the floor. This motion will tilt your pelvis so your tailbone points up toward the ceiling instead of pointing to your feet or the floor. 4. With gentle tension and even breathing, hold this position for 5-10 seconds. Cat-cow Repeat these steps until your lower back becomes more  flexible: 1. Get into a hands-and-knees position on a firm surface. Keep your hands under your shoulders, and keep your knees under your hips. You may place padding under your knees for comfort. 2. Let your head  hang down toward your chest. Contract your abdominal muscles and point your tailbone toward the floor so your lower back becomes rounded like the back of a cat. 3. Hold this position for 5 seconds. 4. Slowly lift your head, let your abdominal muscles relax and point your tailbone up toward the ceiling so your back forms a sagging arch like the back of a cow. 5. Hold this position for 5 seconds.  Press-ups Repeat these steps 5-10 times: 1. Lie on your abdomen (face-down) on the floor. 2. Place your palms near your head, about shoulder-width apart. 3. Keeping your back as relaxed as possible and keeping your hips on the floor, slowly straighten your arms to raise the top half of your body and lift your shoulders. Do not use your back muscles to raise your upper torso. You may adjust the placement of your hands to make yourself more comfortable. 4. Hold this position for 5 seconds while you keep your back relaxed. 5. Slowly return to lying flat on the floor.  Bridges Repeat these steps 10 times: 1. Lie on your back on a firm surface. 2. Bend your knees so they are pointing toward the ceiling and your feet are flat on the floor. Your arms should be flat at your sides, next to your body. 3. Tighten your buttocks muscles and lift your buttocks off the floor until your waist is at almost the same height as your knees. You should feel the muscles working in your buttocks and the back of your thighs. If you do not feel these muscles, slide your feet 1-2 inches farther away from your buttocks. 4. Hold this position for 3-5 seconds. 5. Slowly lower your hips to the starting position, and allow your buttocks muscles to relax completely. If this exercise is too easy, try doing it with your arms  crossed over your chest. Abdominal crunches Repeat these steps 5-10 times: 1. Lie on your back on a firm bed or the floor with your legs extended. 2. Bend your knees so they are pointing toward the ceiling and your feet are flat on the floor. 3. Cross your arms over your chest. 4. Tip your chin slightly toward your chest without bending your neck. 5. Tighten your abdominal muscles and slowly raise your trunk (torso) high enough to lift your shoulder blades a tiny bit off the floor. Avoid raising your torso higher than that because it can put too much stress on your low back and does not help to strengthen your abdominal muscles. 6. Slowly return to your starting position. Back lifts Repeat these steps 5-10 times: 1. Lie on your abdomen (face-down) with your arms at your sides, and rest your forehead on the floor. 2. Tighten the muscles in your legs and your buttocks. 3. Slowly lift your chest off the floor while you keep your hips pressed to the floor. Keep the back of your head in line with the curve in your back. Your eyes should be looking at the floor. 4. Hold this position for 3-5 seconds. 5. Slowly return to your starting position. Contact a health care provider if:  Your back pain or discomfort gets much worse when you do an exercise.  Your worsening back pain or discomfort does not lessen within 2 hours after you exercise. If you have any of these problems, stop doing these exercises right away. Do not do them again unless your health care provider says that you can. Get help right away if:  You develop sudden,  severe back pain. If this happens, stop doing the exercises right away. Do not do them again unless your health care provider says that you can. This information is not intended to replace advice given to you by your health care provider. Make sure you discuss any questions you have with your health care provider. Document Revised: 02/18/2019 Document Reviewed:  07/16/2018 [employer] Patient Education  2020 [employer].  Low Back Sprain or Strain Rehab Ask your health care provider which exercises are safe for you. Do exercises exactly as told by your health care provider and adjust them as directed. It is normal to feel mild stretching, pulling, tightness, or discomfort as you do these exercises. Stop right away if you feel sudden pain or your pain gets worse. Do not begin these exercises until told by your health care provider. Stretching and range-of-motion exercises These exercises warm up your muscles and joints and improve the movement and flexibility of your back. These exercises also help to relieve pain, numbness, and tingling. Lumbar rotation  10. Lie on your back on a firm surface and bend your knees. 11. Straighten your arms out to your sides so each arm forms a 90-degree angle (right angle) with a side of your body. 12. Slowly move (rotate) both of your knees to one side of your body until you feel a stretch in your lower back (lumbar). Try not to let your shoulders lift off the floor. 13. Hold this position for __________ seconds. 14. Tense your abdominal muscles and slowly move your knees back to the starting position. 15. Repeat this exercise on the other side of your body. Repeat __________ times. Complete this exercise __________ times a day. Single knee to chest  9. Lie on your back on a firm surface with both legs straight. 10. Bend one of your knees. Use your hands to move your knee up toward your chest until you feel a gentle stretch in your lower back and buttock. ? Hold your leg in this position by holding on to the front of your knee. ? Keep your other leg as straight as possible. 11. Hold this position for __________ seconds. 12. Slowly return to the starting position. 13. Repeat with your other leg. Repeat __________ times. Complete this exercise __________ times a day. Prone extension on elbows  11. Lie on your abdomen  on a firm surface (prone position). 12. Prop yourself up on your elbows. 13. Use your arms to help lift your chest up until you feel a gentle stretch in your abdomen and your lower back. ? This will place some of your body weight on your elbows. If this is uncomfortable, try stacking pillows under your chest. ? Your hips should stay down, against the surface that you are lying on. Keep your hip and back muscles relaxed. 14. Hold this position for __________ seconds. 15. Slowly relax your upper body and return to the starting position. Repeat __________ times. Complete this exercise __________ times a day. Strengthening exercises These exercises build strength and endurance in your back. Endurance is the ability to use your muscles for a long time, even after they get tired. Pelvic tilt This exercise strengthens the muscles that lie deep in the abdomen. 10. Lie on your back on a firm surface. Bend your knees and keep your feet flat on the floor. 11. Tense your abdominal muscles. Tip your pelvis up toward the ceiling and flatten your lower back into the floor. ? To help with this exercise, you may  place a small towel under your lower back and try to push your back into the towel. 12. Hold this position for __________ seconds. 13. Let your muscles relax completely before you repeat this exercise. Repeat __________ times. Complete this exercise __________ times a day. Alternating arm and leg raises  11. Get on your hands and knees on a firm surface. If you are on a hard floor, you may want to use padding, such as an exercise mat, to cushion your knees. 12. Line up your arms and legs. Your hands should be directly below your shoulders, and your knees should be directly below your hips. 13. Lift your left leg behind you. At the same time, raise your right arm and straighten it in front of you. ? Do not lift your leg higher than your hip. ? Do not lift your arm higher than your shoulder. ? Keep  your abdominal and back muscles tight. ? Keep your hips facing the ground. ? Do not arch your back. ? Keep your balance carefully, and do not hold your breath. 14. Hold this position for __________ seconds. 15. Slowly return to the starting position. 16. Repeat with your right leg and your left arm. Repeat __________ times. Complete this exercise __________ times a day. Abdominal set with straight leg raise  7. Lie on your back on a firm surface. 8. Bend one of your knees and keep your other leg straight. 9. Tense your abdominal muscles and lift your straight leg up, 4-6 inches (10-15 cm) off the ground. 10. Keep your abdominal muscles tight and hold this position for __________ seconds. ? Do not hold your breath. ? Do not arch your back. Keep it flat against the ground. 11. Keep your abdominal muscles tense as you slowly lower your leg back to the starting position. 12. Repeat with your other leg. Repeat __________ times. Complete this exercise __________ times a day. Single leg lower with bent knees 6. Lie on your back on a firm surface. 7. Tense your abdominal muscles and lift your feet off the floor, one foot at a time, so your knees and hips are bent in 90-degree angles (right angles). ? Your knees should be over your hips and your lower legs should be parallel to the floor. 8. Keeping your abdominal muscles tense and your knee bent, slowly lower one of your legs so your toe touches the ground. 9. Lift your leg back up to return to the starting position. ? Do not hold your breath. ? Do not let your back arch. Keep your back flat against the ground. 10. Repeat with your other leg. Repeat __________ times. Complete this exercise __________ times a day. Posture and body mechanics Good posture and healthy body mechanics can help to relieve stress in your body's tissues and joints. Body mechanics refers to the movements and positions of your body while you do your daily activities.  Posture is part of body mechanics. Good posture means:  Your spine is in its natural S-curve position (neutral).  Your shoulders are pulled back slightly.  Your head is not tipped forward. Follow these guidelines to improve your posture and body mechanics in your everyday activities. Standing   When standing, keep your spine neutral and your feet about hip width apart. Keep a slight bend in your knees. Your ears, shoulders, and hips should line up.  When you do a task in which you stand in one place for a long time, place one foot up on a stable  object that is 2-4 inches (5-10 cm) high, such as a footstool. This helps keep your spine neutral. Sitting   When sitting, keep your spine neutral and keep your feet flat on the floor. Use a footrest, if necessary, and keep your thighs parallel to the floor. Avoid rounding your shoulders, and avoid tilting your head forward.  When working at a desk or a computer, keep your desk at a height where your hands are slightly lower than your elbows. Slide your chair under your desk so you are close enough to maintain good posture.  When working at a computer, place your monitor at a height where you are looking straight ahead and you do not have to tilt your head forward or downward to look at the screen. Resting  When lying down and resting, avoid positions that are most painful for you.  If you have pain with activities such as sitting, bending, stooping, or squatting, lie in a position in which your body does not bend very much. For example, avoid curling up on your side with your arms and knees near your chest (fetal position).  If you have pain with activities such as standing for a long time or reaching with your arms, lie with your spine in a neutral position and bend your knees slightly. Try the following positions: ? Lying on your side with a pillow between your knees. ? Lying on your back with a pillow under your knees. Lifting   When  lifting objects, keep your feet at least shoulder width apart and tighten your abdominal muscles.  Bend your knees and hips and keep your spine neutral. It is important to lift using the strength of your legs, not your back. Do not lock your knees straight out.  Always ask for help to lift heavy or awkward objects. This information is not intended to replace advice given to you by your health care provider. Make sure you discuss any questions you have with your health care provider. Document Revised: 02/05/2019 Document Reviewed: 11/05/2018 [employer] Patient Education  2020 [employer].  Thoracic Strain Rehab Ask your health care provider which exercises are safe for you. Do exercises exactly as told by your health care provider and adjust them as directed. It is normal to feel mild stretching, pulling, tightness, or discomfort as you do these exercises. Stop right away if you feel sudden pain or your pain gets worse. Do not begin these exercises until told by your health care provider. Stretching and range-of-motion exercise This exercise warms up your muscles and joints and improves the movement and flexibility of your back and shoulders. This exercise also helps to relieve pain. Chest and spine stretch  16. Lie down on your back on a firm surface. 17. Roll a towel or a small blanket so it is about 4 inches (10 cm) in diameter. 18. Put the towel lengthwise under the middle of your back so it is under your spine, but not under your shoulder blades. 19. Put your hands behind your head and let your elbows fall to your sides. This will increase your stretch. 20. Take a deep breath (inhale). 21. Hold for __________ seconds. 22. Relax after you breathe out (exhale). Repeat __________ times. Complete this exercise __________ times a day. Strengthening exercises These exercises build strength and endurance in your back and your shoulder blade muscles. Endurance is the ability to use your muscles  for a long time, even after they get tired. Alternating arm and leg raises  14. Get on your hands and knees on a firm surface. If you are on a hard floor, you may want to use padding, such as an exercise mat, to cushion your knees. 15. Line up your arms and legs. Your hands should be directly below your shoulders, and your knees should be directly below your hips. 16. Lift your left leg behind you. At the same time, raise your right arm and straighten it in front of you. ? Do not lift your leg higher than your hip. ? Do not lift your arm higher than your shoulder. ? Keep your abdominal and back muscles tight. ? Keep your hips facing the ground. ? Do not arch your back. ? Keep your balance carefully, and do not hold your breath. 17. Hold for __________ seconds. 18. Slowly return to the starting position and repeat with your right leg and your left arm. Repeat __________ times. Complete this exercise __________ times a day. Straight arm rows This exercise is also called shoulder extension exercise. 16. Stand with your feet shoulder width apart. 17. Secure an exercise band to a stable object in front of you so the band is at or above shoulder height. 18. Hold one end of the exercise band in each hand. 19. Straighten your elbows and lift your hands up to shoulder height. 20. Step back, away from the secured end of the exercise band, until the band stretches. 21. Squeeze your shoulder blades together and pull your hands down to the sides of your thighs. Stop when your hands are straight down by your sides. This is shoulder extension. Do not let your hands go behind your body. 22. Hold for __________ seconds. 23. Slowly return to the starting position. Repeat __________ times. Complete this exercise __________ times a day. Prone shoulder external rotation 14. Lie on your abdomen on a firm bed so your left / right forearm hangs over the edge of the bed and your upper arm is on the bed, straight  out from your body. This is the prone position. ? Your elbow should be bent. ? Your palm should be facing your feet. 15. If instructed, hold a __________ weight in your hand. 16. Squeeze your shoulder blade toward the middle of your back. Do not let your shoulder lift toward your ear. 17. Keep your elbow bent in a 90-degree angle (right angle) while you slowly move your forearm up toward the ceiling. Move your forearm up to the height of the bed, toward your head. This is external rotation. ? Your upper arm should not move. ? At the top of the movement, your palm should face the floor. 18. Hold for __________ seconds. 19. Slowly return to the starting position and relax your muscles. Repeat __________ times. Complete this exercise __________ times a day. Rowing scapular retraction This is an exercise in which the shoulder blades (scapulae) are pulled toward each other (retraction). 17. Sit in a stable chair without armrests, or stand up. 18. Secure an exercise band to a stable object in front of you so the band is at shoulder height. 19. Hold one end of the exercise band in each hand. Your palms should face down. 20. Bring your arms out straight in front of you. 21. Step back, away from the secured end of the exercise band, until the band stretches. 22. Pull the band backward. As you do this, bend your elbows and squeeze your shoulder blades together, but avoid letting the rest of your body move. Do not shrug  your shoulders upward while you do this. 23. Stop when your elbows are at your sides or slightly behind your body. 24. Hold for __________ seconds. 25. Slowly straighten your arms to return to the starting position. Repeat __________ times. Complete this exercise __________ times a day. Posture and body mechanics Good posture and healthy body mechanics can help to relieve stress in your body's tissues and joints. Body mechanics refers to the movements and positions of your body while you  do your daily activities. Posture is part of body mechanics. Good posture means:  Your spine is in its natural S-curve position (neutral).  Your shoulders are pulled back slightly.  Your head is not tipped forward. Follow these guidelines to improve your posture and body mechanics in your everyday activities. Standing   When standing, keep your spine neutral and your feet about hip width apart. Keep a slight bend in your knees. Your ears, shoulders, and hips should line up with each other.  When you do a task in which you lean forward while standing in one place for a long time, place one foot up on a stable object that is 2-4 inches (5-10 cm) high, such as a footstool. This helps keep your spine neutral. Sitting   When sitting, keep your spine neutral and keep your feet flat on the floor. Use a footrest, if necessary, and keep your thighs parallel to the floor. Avoid rounding your shoulders, and avoid tilting your head forward.  When working at a desk or a computer, keep your desk at a height where your hands are slightly lower than your elbows. Slide your chair under your desk so you are close enough to maintain good posture.  When working at a computer, place your monitor at a height where you are looking straight ahead and you do not have to tilt your head forward or downward to look at the screen. Resting When lying down and resting, avoid positions that are most painful for you.  If you have pain with activities such as sitting, bending, stooping, or squatting (flexion-basedactivities), lie in a position in which your body does not bend very much. For example, avoid curling up on your side with your arms and knees near your chest (fetal position).  If you have pain with activities such as standing for a long time or reaching with your arms (extension-basedactivities), lie with your spine in a neutral position and bend your knees slightly. Try the following positions: ? Lie on your  side with a pillow between your knees. ? Lie on your back with a pillow under your knees.  Lifting   When lifting objects, keep your feet at least shoulder width apart and tighten your abdominal muscles.  Bend your knees and hips and keep your spine neutral. It is important to lift using the strength of your legs, not your back. Do not lock your knees straight out.  Always ask for help to lift heavy or awkward objects. This information is not intended to replace advice given to you by your health care provider. Make sure you discuss any questions you have with your health care provider. Document Revised: 02/05/2019 Document Reviewed: 11/23/2018 Elsevier Patient Education  2020 [employer]  Cervical Strain and Sprain Rehab Ask your health care provider which exercises are safe for you. Do exercises exactly as told by your health care provider and adjust them as directed. It is normal to feel mild stretching, pulling, tightness, or discomfort as you do these exercises. Stop  right away if you feel sudden pain or your pain gets worse. Do not begin these exercises until told by your health care provider. Stretching and range-of-motion exercises Cervical side bending  23. Using good posture, sit on a stable chair or stand up. 24. Without moving your shoulders, slowly tilt your left / right ear to your shoulder until you feel a stretch in the opposite side neck muscles. You should be looking straight ahead. 25. Hold for __________ seconds. 26. Repeat with the other side of your neck. Repeat __________ times. Complete this exercise __________ times a day. Cervical rotation  19. Using good posture, sit on a stable chair or stand up. 20. Slowly turn your head to the side as if you are looking over your left / right shoulder. ? Keep your eyes level with the ground. ? Stop when you feel a stretch along the side and the back of your neck. 21. Hold for __________ seconds. 22. Repeat this by  turning to your other side. Repeat __________ times. Complete this exercise __________ times a day. Thoracic extension and pectoral stretch 24. Roll a towel or a small blanket so it is about 4 inches (10 cm) in diameter. 25. Lie down on your back on a firm surface. 26. Put the towel lengthwise, under your spine in the middle of your back. It should not be under your shoulder blades. The towel should line up with your spine from your middle back to your lower back. 27. Put your hands behind your head and let your elbows fall out to your sides. 28. Hold for __________ seconds. Repeat __________ times. Complete this exercise __________ times a day. Strengthening exercises Isometric upper cervical flexion 20. Lie on your back with a thin pillow behind your head and a small rolled-up towel under your neck. 21. Gently tuck your chin toward your chest and nod your head down to look toward your feet. Do not lift your head off the pillow. 22. Hold for __________ seconds. 23. Release the tension slowly. Relax your neck muscles completely before you repeat this exercise. Repeat __________ times. Complete this exercise __________ times a day. Isometric cervical extension  26. Stand about 6 inches (15 cm) away from a wall, with your back facing the wall. 27. Place a soft object, about 6-8 inches (15-20 cm) in diameter, between the back of your head and the wall. A soft object could be a small pillow, a ball, or a folded towel. 28. Gently tilt your head back and press into the soft object. Keep your jaw and forehead relaxed. 29. Hold for __________ seconds. 30. Release the tension slowly. Relax your neck muscles completely before you repeat this exercise. Repeat __________ times. Complete this exercise __________ times a day. Posture and body mechanics Body mechanics refers to the movements and positions of your body while you do your daily activities. Posture is part of body mechanics. Good posture and  healthy body mechanics can help to relieve stress in your body's tissues and joints. Good posture means that your spine is in its natural S-curve position (your spine is neutral), your shoulders are pulled back slightly, and your head is not tipped forward. The following are general guidelines for applying improved posture and body mechanics to your everyday activities. Sitting  4. When sitting, keep your spine neutral and keep your feet flat on the floor. Use a footrest, if necessary, and keep your thighs parallel to the floor. Avoid rounding your shoulders, and avoid tilting your head  forward. 5. When working at a desk or a computer, keep your desk at a height where your hands are slightly lower than your elbows. Slide your chair under your desk so you are close enough to maintain good posture. 6. When working at a computer, place your monitor at a height where you are looking straight ahead and you do not have to tilt your head forward or downward to look at the screen. Standing   When standing, keep your spine neutral and keep your feet about hip-width apart. Keep a slight bend in your knees. Your ears, shoulders, and hips should line up.  When you do a task in which you stand in one place for a long time, place one foot up on a stable object that is 2-4 inches (5-10 cm) high, such as a footstool. This helps keep your spine neutral. Resting When lying down and resting, avoid positions that are most painful for you. Try to support your neck in a neutral position. You can use a contour pillow or a small rolled-up towel. Your pillow should support your neck but not push on it. This information is not intended to replace advice given to you by your health care provider. Make sure you discuss any questions you have with your health care provider. Document Revised: 02/03/2019 Document Reviewed: 07/15/2018 Elsevier Patient Education  2020 [employer]  Neck Exercises Ask your health care provider  which exercises are safe for you. Do exercises exactly as told by your health care provider and adjust them as directed. It is normal to feel mild stretching, pulling, tightness, or discomfort as you do these exercises. Stop right away if you feel sudden pain or your pain gets worse. Do not begin these exercises until told by your health care provider. Neck exercises can be important for many reasons. They can improve strength and maintain flexibility in your neck, which will help your upper back and prevent neck pain. Stretching exercises Rotation neck stretching  1. Sit in a chair or stand up. 2. Place your feet flat on the floor, shoulder width apart. 3. Slowly turn your head (rotate) to the right until a slight stretch is felt. Turn it all the way to the right so you can look over your right shoulder. Do not tilt or tip your head. 4. Hold this position for 10-30 seconds. 5. Slowly turn your head (rotate) to the left until a slight stretch is felt. Turn it all the way to the left so you can look over your left shoulder. Do not tilt or tip your head. 6. Hold this position for 10-30 seconds. Repeat __________ times. Complete this exercise __________ times a day. Neck retraction 1. Sit in a sturdy chair or stand up. 2. Look straight ahead. Do not bend your neck. 3. Use your fingers to push your chin backward (retraction). Do not bend your neck for this movement. Continue to face straight ahead. If you are doing the exercise properly, you will feel a slight sensation in your throat and a stretch at the back of your neck. 4. Hold the stretch for 1-2 seconds. Repeat __________ times. Complete this exercise __________ times a day. Strengthening exercises Neck press 1. Lie on your back on a firm bed or on the floor with a pillow under your head. 2. Use your neck muscles to push your head down on the pillow and straighten your spine. 3. Hold the position as well as you can. Keep your head facing up  (in  a neutral position) and your chin tucked. 4. Slowly count to 5 while holding this position. Repeat __________ times. Complete this exercise __________ times a day. Isometrics These are exercises in which you strengthen the muscles in your neck while keeping your neck still (isometrics). 1. Sit in a supportive chair and place your hand on your forehead. 2. Keep your head and face facing straight ahead. Do not flex or extend your neck while doing isometrics. 3. Push forward with your head and neck while pushing back with your hand. Hold for 10 seconds. 4. Do the sequence again, this time putting your hand against the back of your head. Use your head and neck to push backward against the hand pressure. 5. Finally, do the same exercise on either side of your head, pushing sideways against the pressure of your hand. Repeat __________ times. Complete this exercise __________ times a day. Prone head lifts 1. Lie face-down (prone position), resting on your elbows so that your chest and upper back are raised. 2. Start with your head facing downward, near your chest. Position your chin either on or near your chest. 3. Slowly lift your head upward. Lift until you are looking straight ahead. Then continue lifting your head as far back as you can comfortably stretch. 4. Hold your head up for 5 seconds. Then slowly lower it to your starting position. Repeat __________ times. Complete this exercise __________ times a day. Supine head lifts 1. Lie on your back (supine position), bending your knees to point to the ceiling and keeping your feet flat on the floor. 2. Lift your head slowly off the floor, raising your chin toward your chest. 3. Hold for 5 seconds. Repeat __________ times. Complete this exercise __________ times a day. Scapular retraction 1. Stand with your arms at your sides. Look straight ahead. 2. Slowly pull both shoulders (scapulae) backward and downward (retraction) until you feel a  stretch between your shoulder blades in your upper back. 3. Hold for 10-30 seconds. 4. Relax and repeat. Repeat __________ times. Complete this exercise __________ times a day. Contact a health care provider if:  Your neck pain or discomfort gets much worse when you do an exercise.  Your neck pain or discomfort does not improve within 2 hours after you exercise. If you have any of these problems, stop exercising right away. Do not do the exercises again unless your health care provider says that you can. Get help right away if:  You develop sudden, severe neck pain. If this happens, stop exercising right away. Do not do the exercises again unless your health care provider says that you can. This information is not intended to replace advice given to you by your health care provider. Make sure you discuss any questions you have with your health care provider. Document Revised: 08/12/2018 Document Reviewed: 08/12/2018 Elsevier Patient Education  2020 [employer].

## 2020-09-27 NOTE — Progress Notes (Signed)
Chief Complaint  Patient presents with  . Follow-up  . Back Pain  . Knee Pain   F/u  1. Low back pain 2/10, right knee pain worse in the cold and heat helps 2/10 low back pain and 5/10 right knee pain x 2 months worse when took wrong step on a cedar chest back and neck right sided pain worse with sitting and hurts to read and developed right knee pain and posterior lower thigh pain  Pain is interfering with sleep has trouble staying asleep  Tried aspercream w/o help  2.burn to left forearm healing was cooking on the stove burned her arm ~1 week ago doing better    Review of Systems  Constitutional: Negative for weight loss.  HENT: Negative for hearing loss.   Eyes: Negative for blurred vision.  Respiratory: Negative for shortness of breath.   Cardiovascular: Negative for chest pain.  Musculoskeletal: Positive for back pain, joint pain and neck pain.  Skin: Negative for rash.  Psychiatric/Behavioral: The patient has insomnia.    Past Medical History:  Diagnosis Date  . Anemia    kid  . Cataract   . Constipation   . COPD (chronic obstructive pulmonary disease) (HCC)   . Frequent headaches   . Glaucoma   . Glaucoma   . Hyperlipidemia   . Hypertension   . Pneumonia    11/2017   . Postnasal drip    Past Surgical History:  Procedure Laterality Date  . ABDOMINAL HYSTERECTOMY    . TONSILLECTOMY     Family History  Problem Relation Age of Onset  . Heart disease Mother   . Heart murmur Mother        originated from typhoid disease  . Stroke Mother   . Stroke Father    Social History   Socioeconomic History  . Marital status: Widowed    Spouse name: Not on file  . Number of children: Not on file  . Years of education: Not on file  . Highest education level: Not on file  Occupational History  . Not on file  Tobacco Use  . Smoking status: Never Smoker  . Smokeless tobacco: Never Used  Vaping Use  . Vaping Use: Never used  Substance and Sexual Activity  . Alcohol  use: No  . Drug use: No  . Sexual activity: Never    Partners: Male  Other Topics Concern  . 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 Difficulty of Paying Living Expenses: Not hard at all  Food Insecurity: No Food Insecurity  . Worried About Programme researcher, broadcasting/film/video in the Last Year: Never true  . Ran Out of Food in the Last Year: Never true  Transportation Needs: No Transportation Needs  . Lack of Transportation (Medical): No  . Lack of Transportation (Non-Medical): No  Physical Activity:   . Days of Exercise per Week: Not on file  . Minutes of Exercise per Session: Not on file  Stress: No Stress Concern Present  . Feeling of Stress : Not at all  Social Connections: Unknown  . Frequency of Communication with Friends and Family: Not on file  . Frequency of Social Gatherings with Friends and Family: Not on file  . Attends Religious Services: Not on file  .  Active Member of Clubs or Organizations: Not on file  . Attends Banker Meetings: Not on file  . Marital Status: Widowed  Intimate Partner Violence: Not At Risk  . Fear of Current or Ex-Partner: No  . Emotionally Abused: No  . Physically Abused: No  . Sexually Abused: No   Current Meds  Medication Sig  . amLODipine (NORVASC) 5 MG tablet Take 1 tablet (5 mg total) by mouth daily.  . cyclobenzaprine (FLEXERIL) 5 MG tablet Take 1 tablet (5 mg total) by mouth at bedtime as needed for muscle spasms.  . progesterone (PROMETRIUM) 200 MG capsule TAKE ONE CAPSULE BY MOUTH DAILY AT BEDTIME   No Known Allergies Recent Results (from the past 2160 hour(s))  Basic Metabolic Panel (BMET)     Status: Abnormal   Collection Time: 08/01/20 10:28 AM  Result Value Ref Range   Sodium 140 135 - 145 mEq/L   Potassium 3.7 3.5 - 5.1 mEq/L   Chloride 103 96  - 112 mEq/L   CO2 30 19 - 32 mEq/L   Glucose, Bld 90 70 - 99 mg/dL   BUN 20 6 - 23 mg/dL   Creatinine, Ser 3.53 0.40 - 1.20 mg/dL   GFR 61.44 (L) >31.54 mL/min   Calcium 9.4 8.4 - 10.5 mg/dL   Objective  Body mass index is 19.56 kg/m. Wt Readings from Last 3 Encounters:  09/26/20 110 lb 6.4 oz (50.1 kg)  07/13/20 114 lb (51.7 kg)  03/16/20 114 lb 3.2 oz (51.8 kg)   Temp Readings from Last 3 Encounters:  09/26/20 98.6 F (37 C) (Oral)  03/16/20 (!) 97.4 F (36.3 C)  09/14/19 (!) 97.3 F (36.3 C) (Skin)   BP Readings from Last 3 Encounters:  09/26/20 122/70  03/16/20 140/70  09/14/19 112/70   Pulse Readings from Last 3 Encounters:  09/26/20 91  03/16/20 77  09/14/19 91    Physical Exam Vitals and nursing note reviewed.  Constitutional:      Appearance: Normal appearance. She is well-developed and well-groomed.  HENT:     Head: Normocephalic and atraumatic.  Eyes:     Conjunctiva/sclera: Conjunctivae normal.     Pupils: Pupils are equal, round, and reactive to light.  Cardiovascular:     Rate and Rhythm: Normal rate and regular rhythm.     Heart sounds: Normal heart sounds. No murmur heard.   Pulmonary:     Effort: Pulmonary effort is normal.     Breath sounds: Normal breath sounds.  Skin:    General: Skin is warm and dry.  Neurological:     General: No focal deficit present.     Mental Status: She is alert and oriented to person, place, and time. Mental status is at baseline.  Psychiatric:        Attention and Perception: Attention and perception normal.        Mood and Affect: Mood and affect normal.        Speech: Speech normal.        Behavior: Behavior normal. Behavior is cooperative.        Thought Content: Thought content normal.        Cognition and Memory: Cognition and memory normal.        Judgment: Judgment normal.     Assessment  Plan  Thoracic arthritis DDD (degenerative disc disease), cervical Strain of right hamstring, initial  encounter Acute pain of right knee -given exercises to do  -declines PT pain meds today  Insomnia, unspecified type -disc L theanine or stress relax tranquil sleep  Melatonin did not help in the past  Disc trazadone   Superficial burn of left forearm, initial encounter  Improving   HM HM schFasting labs [facility] [place] Littie Deeds [place]. 05/16/20 Call back with name of eye doctor  Declines flu shot Declines covid vx prevnar utd, pna 23 vaccine utd  Tdap had ? Datecheck date  shingrix disc today -given Rx shingrix and Tdap prev  No pap s/p hysterectomy DUB rec mammoneed recordscaliforniadeclines as of 03/16/20 Negcologuard6/2019no FH colon cancer due 03/2021  H/o DEXA in [place]. Pt has records at homebut did not bring todayagain rec get [place] issues  Provider: Dr. French Ana McLean-Scocuzza-[facility]

## 2021-01-22 ENCOUNTER — Other Ambulatory Visit: Payer: Self-pay | Admitting: Internal Medicine

## 2021-01-22 DIAGNOSIS — M545 Low back pain, unspecified: Secondary | ICD-10-CM

## 2021-05-03 ENCOUNTER — Other Ambulatory Visit: Payer: Self-pay | Admitting: Internal Medicine

## 2021-05-03 DIAGNOSIS — I10 Essential (primary) hypertension: Secondary | ICD-10-CM

## 2021-05-16 ENCOUNTER — Telehealth: Payer: Self-pay | Admitting: Internal Medicine

## 2021-05-16 NOTE — Telephone Encounter (Signed)
Patient stated she would take a home test and call with results.

## 2021-05-16 NOTE — Telephone Encounter (Signed)
Rec covid 19 testing home or [facility] or [facility]

## 2021-05-16 NOTE — Telephone Encounter (Signed)
Transfer to Access Nurse to advise no apt available in office or virtually  PT called in to advise they have a sinus infection they think and wanted to be seen. PT advise they have head congestion, ear ringing and feels like it is stopped up as well as hurting, and they also state they can't breath. Transfer and advise no apt available for today.

## 2021-05-16 NOTE — Telephone Encounter (Addendum)
Patient say she is going to use OTC allergy medication loratadine and see if this helps if not she will call back, and schedule virtual.Patient stated to this nurse that  she has sinus congestion and pressure, no fever, no chills, or body aches, has had the sinus congestion several weeks, pressure in ears around eyes, advised UC patient refused stating she was going totry allergy medication first if symptoms worsen or n better she will call back.

## 2021-05-16 NOTE — Telephone Encounter (Signed)
Noted,  For your information  °

## 2021-05-16 NOTE — Telephone Encounter (Addendum)
-  Caller states she has a sinus infection with chest congestion, trouble breathing and ear congestion. Ear ringing. NO appts available today per office. No temp.Had a temp a couple of weeks ago. Has had sx for a couple of weeks. Patient was advised by Access nurse to be seen with 4 hours. Tried to reach patient to verify being seen at [facility] no answer left message to call office.

## 2021-05-17 NOTE — Telephone Encounter (Signed)
Give # for my chart password help  Does she need appt virtually please schedule?

## 2021-05-17 NOTE — Telephone Encounter (Signed)
For your information  

## 2021-05-17 NOTE — Telephone Encounter (Signed)
PT called to inform that she had done the covid test and it was negative. She currently has trouble getting into her mychart and wanted to advise [place].

## 2021-05-21 NOTE — Telephone Encounter (Signed)
Gave Patient her username and changed password. Gave this to the Patient.   Patient would like a referral to ENT for possible allergies.

## 2021-05-24 NOTE — Addendum Note (Signed)
Addended by: Quentin Ore on: 05/24/2021 09:27 AM   Modules accepted: Orders

## 2021-05-24 NOTE — Telephone Encounter (Signed)
Referral sent 

## 2021-06-05 ENCOUNTER — Telehealth: Payer: Self-pay | Admitting: Internal Medicine

## 2021-06-05 NOTE — Telephone Encounter (Signed)
Patient informed, Due to the high volume of calls and your symptoms we have to forward your call to our Triage Nurse to expedient your call. Please hold for the transfer.  Patient transferred to Access Nurse. Due to not feeling better after going to the urgent care for water in her ear and head.Patient finished an antibiotic but she is still feeling exhausted,her head is stopped up and she has a low grade fever. No available openings in office or virtual.

## 2021-06-06 NOTE — Telephone Encounter (Signed)
Patient was instructed to call our office once we open.

## 2021-06-07 NOTE — Telephone Encounter (Signed)
Can yall call patient and follow up? Did she see ent? And see prior note

## 2021-06-07 NOTE — Telephone Encounter (Signed)
Sent referral ent 05/24/21 what is going on with this ? Did she see if still not feeling well rec ENT f/u and if not feeling well call back urgent care for further evaluation

## 2021-06-07 NOTE — Telephone Encounter (Signed)
Still need to f/u ENT

## 2021-06-08 NOTE — Telephone Encounter (Signed)
I called patient & advised that she still needed to f/u with ENT. Pt stated that she would call & schedule.

## 2021-06-20 ENCOUNTER — Other Ambulatory Visit: Payer: Self-pay

## 2021-06-20 ENCOUNTER — Telehealth (INDEPENDENT_AMBULATORY_CARE_PROVIDER_SITE_OTHER): Payer: Medicare Other | Admitting: Family Medicine

## 2021-06-20 DIAGNOSIS — J0100 Acute maxillary sinusitis, unspecified: Secondary | ICD-10-CM | POA: Diagnosis not present

## 2021-06-20 DIAGNOSIS — R5383 Other fatigue: Secondary | ICD-10-CM

## 2021-06-20 MED ORDER — AMOXICILLIN-POT CLAVULANATE 875-125 MG PO TABS: 1.0000 | ORAL_TABLET | Freq: Two times a day (BID) | ORAL | 0 refills | Status: DC

## 2021-06-20 NOTE — Progress Notes (Signed)
Patient ID: Lynn Powell, female   DOB: 1941/02/13, 80 y.o.   MRN: [id number]  This visit type was conducted due to national recommendations for restrictions regarding the COVID-19 pandemic in an effort to limit this patient's exposure and mitigate transmission in our community.   Virtual Visit via Video Note  I connected with Lynn Powell on 06/20/21 at  3:45 PM EDT by a video enabled telemedicine application and verified that I am speaking with the correct person using two identifiers.  Location patient: home Location provider:work or home office Persons participating in the virtual visit: patient, provider  I discussed the limitations of evaluation and management by telemedicine and the availability of in person appointments. The patient expressed understanding and agreed to proceed.   HPI:  Lynn Powell relates onset about 2 months ago some upper respiratory symptoms.  She had some significant head congestion and ear congestion.  At 1 point she had low-grade fever for about a week.  She states she went to urgent care end of July had COVID testing which was negative.  She was placed on some sort of medication but not an antibiotic.  She had some ringing in her ears.  She has some bilateral maxillary facial pain.  Ongoing fatigue.  She states that her daughter and son-in-law do missionary work down to [place] part-time and they brought back ivermectin and she actually took a course of that and just recently finished it.  She feels that her fatigue was slightly better after the ivermectin.  Denies any current headaches.  No purulent secretions.  No significant cough.  No dyspnea.  Occasional upper teeth pain.   ROS: See pertinent positives and negatives per HPI.  Past Medical History:  Diagnosis Date   Anemia    kid   Cataract    Constipation    COPD (chronic obstructive pulmonary disease) (HCC)    Frequent headaches    Glaucoma    Glaucoma    Hyperlipidemia    Hypertension     Pneumonia    11/2017    Postnasal drip     Past Surgical History:  Procedure Laterality Date   ABDOMINAL HYSTERECTOMY     TONSILLECTOMY      Family History  Problem Relation Age of Onset   Heart disease Mother    Heart murmur Mother        originated from typhoid disease   Stroke Mother    Stroke Father     SOCIAL HX: Non-smoker   Current Outpatient Medications:    amoxicillin-clavulanate (AUGMENTIN) 875-125 MG tablet, Take 1 tablet by mouth 2 (two) times daily., Disp: 20 tablet, Rfl: 0   amLODipine (NORVASC) 5 MG tablet, TAKE 1 TABLET BY MOUTH EVERY DAY, Disp: 90 tablet, Rfl: 3   cyclobenzaprine (FLEXERIL) 5 MG tablet, TAKE 1 TABLET (5 MG TOTAL) BY MOUTH AT BEDTIME AS NEEDED FOR MUSCLE SPASMS., Disp: 90 tablet, Rfl: 3   fluticasone (FLONASE) 50 MCG/ACT nasal spray, Place 2 sprays into both nostrils daily. Prn nasal congestion (Patient not taking: Reported on 09/26/2020), Disp: 16 g, Rfl: 11   progesterone (PROMETRIUM) 200 MG capsule, TAKE ONE CAPSULE BY MOUTH DAILY AT BEDTIME, Disp: , Rfl: 0  EXAM:  VITALS per patient if applicable:  GENERAL: alert, oriented, appears well and in no acute distress  HEENT: atraumatic, conjunttiva clear, no obvious abnormalities on inspection of external nose and ears  NECK: normal movements of the head and neck  LUNGS: on inspection no signs of respiratory distress, breathing rate  appears normal, no obvious gross SOB, gasping or wheezing  CV: no obvious cyanosis  MS: moves all visible extremities without noticeable abnormality  PSYCH/NEURO: pleasant and cooperative, no obvious depression or anxiety, speech and thought processing grossly intact  ASSESSMENT AND PLAN:  Discussed the following assessment and plan:   Persistent upper respiratory symptoms.  Duration of 2 months.  Bilateral facial pain.  Question bilateral maxillary sinusitis  -Given duration of symptoms start Augmentin 875 mg twice daily with food for 10 days -Plenty  of fluids and rest -Follow-up with primary if not resolving with the above.    I discussed the assessment and treatment plan with the patient. The patient was provided an opportunity to ask questions and all were answered. The patient agreed with the plan and demonstrated an understanding of the instructions.   The patient was advised to call back or seek an in-person evaluation if the symptoms worsen or if the condition fails to improve as anticipated.     Evelena Peat, MD

## 2021-07-16 ENCOUNTER — Ambulatory Visit: Payer: Medicare Other

## 2021-07-16 ENCOUNTER — Telehealth: Payer: Self-pay

## 2021-07-16 NOTE — Telephone Encounter (Signed)
Unable to reach patient on preferred number. Left message to reschedule.

## 2021-07-17 ENCOUNTER — Ambulatory Visit (INDEPENDENT_AMBULATORY_CARE_PROVIDER_SITE_OTHER): Payer: Medicare Other

## 2021-07-17 VITALS — Ht 63.0 in | Wt 110.0 lb

## 2021-07-17 DIAGNOSIS — Z0000 Encounter for general adult medical examination without abnormal findings: Secondary | ICD-10-CM | POA: Diagnosis not present

## 2021-07-17 NOTE — Patient Instructions (Addendum)
  Ms. Maddison , Thank you for taking time to come for your Medicare Wellness Visit. I appreciate your ongoing commitment to your health goals. Please review the following plan we discussed and let me know if I can assist you in the future.   These are the goals we discussed:  Goals       Patient Stated     Maintain Healthy Lifestyle (pt-stated)      Stay active; track steps with pedometer Schedule dental exam         This is a list of the screening recommended for you and due dates:  Health Maintenance  Topic Date Due   Hepatitis C Screening: USPSTF Recommendation to screen - Ages 18-79 yo.  09/05/2021*   Zoster (Shingles) Vaccine (1 of 2) 10/16/2021*   DEXA scan (bone density measurement)  07/17/2022*   HPV Vaccine  Aged Out   Flu Shot  Discontinued   Tetanus Vaccine  Discontinued   COVID-19 Vaccine  Discontinued  *Topic was postponed. The date shown is not the original due date.

## 2021-07-17 NOTE — Progress Notes (Signed)
Subjective:   Lynn Powell is a 80 y.o. female who presents for Medicare Annual (Subsequent) preventive examination.  Review of Systems    No ROS.  Medicare Wellness Virtual Visit.  Visual/audio telehealth visit, UTA vital signs.   See social history for additional risk factors.   Cardiac Risk Factors include: advanced age (>97men, >8 women);hypertension     Objective:    Today's Vitals   07/17/21 0903  Weight: 110 lb (49.9 kg)  Height: 5\' 3"  (1.6 m)   Body mass index is 19.49 kg/m.  Advanced Directives 07/17/2021 07/13/2020 08/11/2019 07/13/2019 07/01/2018  Does Patient Have a Medical Advance Directive? No No No No No  Does patient want to make changes to medical advance directive? - - - - Yes (MAU/Ambulatory/Procedural Areas - Information given)  Would patient like information on creating a medical advance directive? No - Patient declined No - Patient declined - No - Patient declined -    Current Medications (verified) Outpatient Encounter Medications as of 07/17/2021  Medication Sig   amLODipine (NORVASC) 5 MG tablet TAKE 1 TABLET BY MOUTH EVERY DAY   cyclobenzaprine (FLEXERIL) 5 MG tablet TAKE 1 TABLET (5 MG TOTAL) BY MOUTH AT BEDTIME AS NEEDED FOR MUSCLE SPASMS.   fluticasone (FLONASE) 50 MCG/ACT nasal spray Place 2 sprays into both nostrils daily. Prn nasal congestion (Patient not taking: Reported on 09/26/2020)   progesterone (PROMETRIUM) 200 MG capsule TAKE ONE CAPSULE BY MOUTH DAILY AT BEDTIME   [DISCONTINUED] amoxicillin-clavulanate (AUGMENTIN) 875-125 MG tablet Take 1 tablet by mouth 2 (two) times daily.   No facility-administered encounter medications on file as of 07/17/2021.    Allergies (verified) Patient has no known allergies.   History: Past Medical History:  Diagnosis Date   Anemia    kid   Cataract    Constipation    COPD (chronic obstructive pulmonary disease) (HCC)    Frequent headaches    Glaucoma    Glaucoma    Hyperlipidemia     Hypertension    Pneumonia    11/2017    Postnasal drip    Past Surgical History:  Procedure Laterality Date   ABDOMINAL HYSTERECTOMY     TONSILLECTOMY     Family History  Problem Relation Age of Onset   Heart disease Mother    Heart murmur Mother        originated from typhoid disease   Stroke Mother    Stroke Father    Social History   Socioeconomic History   Marital status: Widowed    Spouse name: Not on file   Number of children: Not on file   Years of education: Not on file   Highest education level: Not on file  Occupational History   Not on file  Tobacco Use   Smoking status: Never   Smokeless tobacco: Never  Vaping Use   Vaping Use: Never used  Substance and Sexual Activity   Alcohol use: No   Drug use: No   Sexual activity: Never    Partners: Male  Other Topics Concern   Not on file  Social History Narrative   3 kids    Moved from 12/2017 to be with daughter and grandchildren    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    Social Determinants of Health   Financial Resource Strain: Low Risk    Difficulty of Paying Living Expenses: Not hard at all  Food Insecurity: No Food Insecurity   Worried About [place] in the Last  Year: Never true   Ran Out of Food in the Last Year: Never true  Transportation Needs: No Transportation Needs   Lack of Transportation (Medical): No   Lack of Transportation (Non-Medical): No  Physical Activity: Insufficiently Active   Days of Exercise per Week: 3 days   Minutes of Exercise per Session: 20 min  Stress: No Stress Concern Present   Feeling of Stress : Not at all  Social Connections: Unknown   Frequency of Communication with Friends and Family: More than three times a week   Frequency of Social Gatherings with Friends and Family: More than three times a week   Attends Religious Services: Not on file   Active Member of Clubs or Organizations: Not on file   Attends Banker Meetings: Not on file    Marital Status: Widowed    Tobacco Counseling Counseling given: Not Answered   Clinical Intake:  Pre-visit preparation completed: Yes        Diabetes: No  How often do you need to have someone help you when you read instructions, pamphlets, or other written materials from your doctor or pharmacy?: 1 - Never   Interpreter Needed?: No      Activities of Daily Living In your present state of health, do you have any difficulty performing the following activities: 07/17/2021  Hearing? N  Vision? N  Difficulty concentrating or making decisions? N  Walking or climbing stairs? N  Dressing or bathing? N  Doing errands, shopping? N  Preparing Food and eating ? N  Using the Toilet? N  In the past six months, have you accidently leaked urine? N  Do you have problems with loss of bowel control? N  Managing your Medications? N  Managing your Finances? N  Housekeeping or managing your Housekeeping? N  Some recent data might be hidden    Patient Care Team: McLean-Scocuzza, Pasty Spillers, MD as PCP - General (Internal Medicine)  Indicate any recent Medical Services you may have received from other than [facility] providers in the past year (date may be approximate).     Assessment:   This is a routine wellness examination for [place].  I connected with Lynn Powell today by telephone and verified that I am speaking with the correct person using two identifiers. Location patient: home Location provider: work Persons participating in the virtual visit: patient, Engineer, civil (consulting).    I discussed the limitations, risks, security and privacy concerns of performing an evaluation and management service by telephone and the availability of in person appointments. The patient expressed understanding and verbally consented to this telephonic visit.    Interactive audio and video telecommunications were attempted between this provider and patient, however failed, due to patient having technical difficulties OR  patient did not have access to video capability.  We continued and completed visit with audio only.  Some vital signs may be absent or patient reported.   Hearing/Vision screen Hearing Screening - Comments:: Patient is able to hear conversational tones without difficulty. No issues reported. Vision Screening - Comments:: Followed by [facility]  Wears corrective lenses when reading They have regular follow up with the ophthalmologist  Dietary issues and exercise activities discussed: Current Exercise Habits: Home exercise routine, Type of exercise: walking, Intensity: Mild Healthy diet Good water intake   Goals Addressed               This Visit's Progress     Patient Stated     Maintain Healthy Lifestyle (pt-stated)  Stay active; track steps with pedometer Schedule dental exam        Depression Screen PHQ 2/9 Scores 07/17/2021 07/13/2020 03/16/2020 07/13/2019 06/01/2019 07/01/2018 07/01/2018  PHQ - 2 Score 0 0 0 0 0 0 0    Fall Risk Fall Risk  07/17/2021 09/26/2020 07/13/2020 03/16/2020 09/14/2019  Falls in the past year? 0 1 0 0 1  Number falls in past yr: - 0 0 - 0  Injury with Fall? 0 0 - - 1  Comment - - - - -  Follow up Falls evaluation completed Falls evaluation completed Falls evaluation completed Falls evaluation completed -    FALL RISK PREVENTION PERTAINING TO THE HOME: Adequate lighting in your home to reduce risk of falls? Yes   ASSISTIVE DEVICES UTILIZED TO PREVENT FALLS: Life alert? No  Use of a cane, walker or w/c? No   TIMED UP AND GO: Was the test performed? No .   Cognitive Function: Patient is alert and oriented x3.   MMSE - Mini Mental State Exam 07/01/2018  Orientation to time 5  Orientation to Place 5  Registration 3  Attention/ Calculation 5  Recall 3  Language- name 2 objects 2  Language- repeat 1  Language- follow 3 step command 3  Language- read & follow direction 1  Write a sentence 1  Copy design 1  Total score 30      6CIT Screen 07/13/2019  What Year? 0 points  What month? 0 points  What time? 0 points  Count back from 20 0 points  Months in reverse 0 points  Repeat phrase 0 points  Total Score 0    Immunizations Immunization History  Administered Date(s) Administered   Pneumococcal Conjugate-13 02/02/2018   Pneumococcal Polysaccharide-23 09/14/2019   Shingrix vaccine- Due, Education has been provided regarding the importance of this vaccine. Advised may receive this vaccine at local pharmacy or Health Dept. Aware to provide a copy of the vaccination record if obtained from local pharmacy or Health Dept. Verbalized acceptance and understanding. Deferred.   Health Maintenance Health Maintenance  Topic Date Due   Hepatitis C Screening  09/05/2021 (Originally 12/18/1958)   Zoster Vaccines- Shingrix (1 of 2) 10/16/2021 (Originally 12/17/1959)   DEXA SCAN  07/17/2022 (Originally 12/17/2006)   HPV VACCINES  Aged Out   INFLUENZA VACCINE  Discontinued   TETANUS/TDAP  Discontinued   COVID-19 Vaccine  Discontinued   Cologuard- deferred for further discussion with pcp per patient request.   Mammogram- declined.   Dexa Scan- declined.   Lung Cancer Screening: (Low Dose CT Chest recommended if Age 6-80 years, 30 pack-year currently smoking OR have quit w/in 15years.) does not qualify.   Hepatitis C Screening: deferred.   Vision Screening: Recommended annual ophthalmology exams for early detection of glaucoma and other disorders of the eye.  Dental Screening: Recommended annual dental exams for proper oral hygiene  Community Resource Referral / Chronic Care Management: CRR required this visit?  No   CCM required this visit?  No      Plan:   Keep all routine maintenance appointments.   I have personally reviewed and noted the following in the patient's chart:   Medical and social history Use of alcohol, tobacco or illicit drugs  Current medications and supplements including opioid  prescriptions. Not taking opioid.  Functional ability and status Nutritional status Physical activity Advanced directives List of other physicians Hospitalizations, surgeries, and ER visits in previous 12 months Vitals Screenings to include cognitive, depression, and falls  Referrals and appointments  In addition, I have reviewed and discussed with patient certain preventive protocols, quality metrics, and best practice recommendations. A written personalized care plan for preventive services as well as general preventive health recommendations were provided to patient via mychart.     Ashok Pall, LPN   [id number]

## 2021-07-27 ENCOUNTER — Telehealth: Payer: Self-pay | Admitting: Internal Medicine

## 2021-07-27 NOTE — Telephone Encounter (Signed)
Rejection Reason - Patient was No Show" Rowe Robert said on Jul 26, 2021 10:03 AM  Appt was on 07/03/2021 at 10 am  Msg from [place] ent

## 2021-09-05 ENCOUNTER — Other Ambulatory Visit: Payer: Self-pay

## 2021-09-05 ENCOUNTER — Encounter: Payer: Self-pay | Admitting: Internal Medicine

## 2021-09-05 ENCOUNTER — Ambulatory Visit (INDEPENDENT_AMBULATORY_CARE_PROVIDER_SITE_OTHER): Payer: Medicare Other | Admitting: Internal Medicine

## 2021-09-05 VITALS — BP 134/70 | HR 57 | Temp 97.0°F | Ht 63.0 in | Wt 110.0 lb

## 2021-09-05 DIAGNOSIS — R0982 Postnasal drip: Secondary | ICD-10-CM | POA: Diagnosis not present

## 2021-09-05 DIAGNOSIS — R059 Cough, unspecified: Secondary | ICD-10-CM | POA: Diagnosis not present

## 2021-09-05 DIAGNOSIS — I10 Essential (primary) hypertension: Secondary | ICD-10-CM | POA: Diagnosis not present

## 2021-09-05 DIAGNOSIS — Z1231 Encounter for screening mammogram for malignant neoplasm of breast: Secondary | ICD-10-CM

## 2021-09-05 DIAGNOSIS — J309 Allergic rhinitis, unspecified: Secondary | ICD-10-CM

## 2021-09-05 DIAGNOSIS — E785 Hyperlipidemia, unspecified: Secondary | ICD-10-CM

## 2021-09-05 DIAGNOSIS — Z1389 Encounter for screening for other disorder: Secondary | ICD-10-CM

## 2021-09-05 DIAGNOSIS — Z1211 Encounter for screening for malignant neoplasm of colon: Secondary | ICD-10-CM

## 2021-09-05 DIAGNOSIS — R0602 Shortness of breath: Secondary | ICD-10-CM

## 2021-09-05 DIAGNOSIS — Z1329 Encounter for screening for other suspected endocrine disorder: Secondary | ICD-10-CM

## 2021-09-05 DIAGNOSIS — Z1212 Encounter for screening for malignant neoplasm of rectum: Secondary | ICD-10-CM

## 2021-09-05 DIAGNOSIS — G4700 Insomnia, unspecified: Secondary | ICD-10-CM

## 2021-09-05 MED ORDER — IPRATROPIUM BROMIDE 0.06 % NA SOLN: 2.0000 | Freq: Three times a day (TID) | NASAL | 12 refills | Status: DC

## 2021-09-05 NOTE — Progress Notes (Signed)
Chief Complaint  Patient presents with   Follow-up   F/u  1. Since 04/2021 dry cough, pnd, hoarseness prev referred to ENT pt did not go yet and no h/o allergies holistic doctor in [place] sent supplements for immune system which helped but called her to mail more 2. Htn on norvasc 5 mg qd  3. Chronic insomnia melatonin does not work and does not want to take medication for sleep  4. Pt requests cologuard  5.c/o sob with exertion  Cxr 12/30/17 negative consider further w/u echo in the future if desired will let me know if desired    Review of Systems  Constitutional:  Negative for weight loss.  HENT:  Negative for hearing loss.   Eyes:  Negative for blurred vision.  Respiratory:  Negative for shortness of breath.   Cardiovascular:  Negative for chest pain.  Gastrointestinal:  Negative for abdominal pain and blood in stool.  Genitourinary:  Negative for dysuria.  Musculoskeletal:  Negative for falls and joint pain.  Skin:  Negative for rash.  Neurological:  Negative for headaches.  Psychiatric/Behavioral:  Negative for depression.   Past Medical History:  Diagnosis Date   Anemia    kid   Cataract    Constipation    COPD (chronic obstructive pulmonary disease) (HCC)    Frequent headaches    Glaucoma    Glaucoma    Hyperlipidemia    Hypertension    Pneumonia    11/2017    Postnasal drip    Past Surgical History:  Procedure Laterality Date   ABDOMINAL HYSTERECTOMY     TONSILLECTOMY     Family History  Problem Relation Age of Onset   Heart disease Mother    Heart murmur Mother        originated from typhoid disease   Stroke Mother    Stroke Father    Social History   Socioeconomic History   Marital status: Widowed    Spouse name: Not on file   Number of children: Not on file   Years of education: Not on file   Highest education level: Not on file  Occupational History   Not on file  Tobacco Use   Smoking status: Never   Smokeless tobacco: Never  Vaping Use    Vaping Use: Never used  Substance and Sexual Activity   Alcohol use: No   Drug use: No   Sexual activity: Never    Partners: Male  Other Topics Concern   Not on file  Social History Narrative   3 kids 69.46, 81 as of 09/05/21    Moved from [place] to be with daughter and grandchildren    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    Social Determinants of Health   Financial Resource Strain: Low Risk    Difficulty of Paying Living Expenses: Not hard at all  Food Insecurity: No Food Insecurity   Worried About Programme researcher, broadcasting/film/video in the Last Year: Never true   Barista in the Last Year: Never true  Transportation Needs: No Transportation Needs   Lack of Transportation (Medical): No   Lack of Transportation (Non-Medical): No  Physical Activity: Insufficiently Active   Days of Exercise per Week: 3 days   Minutes of Exercise per Session: 20 min  Stress: No Stress Concern Present   Feeling of Stress : Not at all  Social Connections: Unknown   Frequency of Communication with Friends and Family: More than three times a week  Frequency of Social Gatherings with Friends and Family: More than three times a week   Attends Religious Services: Not on file   Active Member of Clubs or Organizations: Not on file   Attends Banker Meetings: Not on file   Marital Status: Widowed  Catering manager Violence: Not At Risk   Fear of Current or Ex-Partner: No   Emotionally Abused: No   Physically Abused: No   Sexually Abused: No   Current Meds  Medication Sig   amLODipine (NORVASC) 5 MG tablet TAKE 1 TABLET BY MOUTH EVERY DAY   conjugated estrogens (PREMARIN) 25 MG injection Inject 25 mg into the vein.   ipratropium (ATROVENT) 0.06 % nasal spray Place 2 sprays into both nostrils 3 (three) times daily. prn   progesterone (PROMETRIUM) 200 MG capsule TAKE ONE CAPSULE BY MOUTH DAILY AT BEDTIME   No Known Allergies No results found for this or any previous visit (from the past 2160  hour(s)). Objective  Body mass index is 19.49 kg/m. Wt Readings from Last 3 Encounters:  09/05/21 110 lb (49.9 kg)  07/17/21 110 lb (49.9 kg)  09/26/20 110 lb 6.4 oz (50.1 kg)   Temp Readings from Last 3 Encounters:  09/05/21 (!) 97 F (36.1 C) (Temporal)  09/26/20 98.6 F (37 C) (Oral)  03/16/20 (!) 97.4 F (36.3 C)   BP Readings from Last 3 Encounters:  09/05/21 134/70  09/26/20 122/70  03/16/20 140/70   Pulse Readings from Last 3 Encounters:  09/05/21 (!) 57  09/26/20 91  03/16/20 77    Physical Exam Vitals and nursing note reviewed.  Constitutional:      Appearance: Normal appearance. She is well-developed and well-groomed.  HENT:     Head: Normocephalic and atraumatic.  Eyes:     Conjunctiva/sclera: Conjunctivae normal.     Pupils: Pupils are equal, round, and reactive to light.  Cardiovascular:     Rate and Rhythm: Normal rate and regular rhythm.     Heart sounds: Normal heart sounds. No murmur heard. Pulmonary:     Effort: Pulmonary effort is normal.     Breath sounds: Normal breath sounds.  Abdominal:     General: Abdomen is flat. Bowel sounds are normal.     Tenderness: There is no abdominal tenderness.  Musculoskeletal:        General: No tenderness.  Skin:    General: Skin is warm and dry.  Neurological:     General: No focal deficit present.     Mental Status: She is alert and oriented to person, place, and time. Mental status is at baseline.     Cranial Nerves: Cranial nerves 2-12 are intact.     Gait: Gait is intact.  Psychiatric:        Attention and Perception: Attention and perception normal.        Mood and Affect: Mood and affect normal.        Speech: Speech normal.        Behavior: Behavior normal. Behavior is cooperative.        Thought Content: Thought content normal.        Cognition and Memory: Cognition and memory normal.        Judgment: Judgment normal.    Assessment  Plan  PND (post-nasal drip) ?allergies no hx- Plan:  ipratropium (ATROVENT) 0.06 % nasal spray Xyzal 5 mg at night  Nasal saline  Nasacort or flonase  Consider ent given # to schedule   Cough and sob  with exertion  Consider ent  Cxr 12/30/17 neg  Consider echo further w/u   Essential hypertension - Plan: Comprehensive metabolic panel, Lipid panel, CBC with Differential/Platelet On norvasc 5 mg qd   Hyperlipidemia, unspecified hyperlipidemia type - Plan: Lipid panel  Chronic insomnia  Melatonin did not help in the past consider L theanine pt does not want to try Rx  HM sch  Fasting labs [facility] and have labs from [place] [facility] faxed to me Dr. Littie Deeds [place]. 05/16/20 Call back with name of eye doctor    Declines flu shot Declines covid vx prevnar utd, pna 23 vaccine utd  -given Rx shingrix and Tdap today   No pap s/p hysterectomy DUB rec mammo need records [place] --ordered 09/05/21 call to schedule  Neg cologuard 03/2018 no FH colon cancer due 03/2021 ordered cologuard today   H/o DEXA in [place] Pt has records at home but did not bring today again rec get records  skin check had 11/2016 in [place] declines as of 09/14/19 no issues    Provider: Dr. French Ana McLean-Scocuz[facility]

## 2021-09-05 NOTE — Patient Instructions (Addendum)
Xyzal 5 mg at night  Nasal saline  Nasacort or flonase   Nature made L theanine consider for sleep   [facility] [phone] Not available [address]   [place] [address]   Zoster Vaccine, Recombinant injection What is this medication? ZOSTER VACCINE (ZOS ter vak SEEN) is a vaccine used to reduce the risk of getting shingles. This vaccine is not used to treat shingles or nerve pain from shingles. This medicine may be used for other purposes; ask your health care provider or pharmacist if you have questions. COMMON BRAND NAME(S): [facility] What should I tell my care team before I take this medication? They need to know if you have any of these conditions: cancer immune system problems an unusual or allergic reaction to Zoster vaccine, other medications, foods, dyes, or preservatives pregnant or trying to get pregnant breast-feeding How should I use this medication? This vaccine is injected into a muscle. It is given by a health care provider. A copy of Vaccine Information Statements will be given before each vaccination. Be sure to read this information carefully each time. This sheet may change often. Talk to your health care provider about the use of this vaccine in children. This vaccine is not approved for use in children. Overdosage: If you think you have taken too much of this medicine contact a poison control center or emergency room at once. NOTE: This medicine is only for you. Do not share this medicine with others. What if I miss a dose? Keep appointments for follow-up (booster) doses. It is important not to miss your dose. Call your health care provider if you are unable to keep an appointment. What may interact with this medication? medicines that suppress your immune system medicines to treat cancer steroid medicines like prednisone or cortisone This list may not describe all possible interactions. Give your health care provider  a list of all the medicines, herbs, non-prescription drugs, or dietary supplements you use. Also tell them if you smoke, drink alcohol, or use illegal drugs. Some items may interact with your medicine. What should I watch for while using this medication? Visit your health care provider regularly. This vaccine, like all vaccines, may not fully protect everyone. What side effects may I notice from receiving this medication? Side effects that you should report to your doctor or health care professional as soon as possible: allergic reactions (skin rash, itching or hives; swelling of the face, lips, or tongue) trouble breathing Side effects that usually do not require medical attention (report these to your doctor or health care professional if they continue or are bothersome): chills headache fever nausea pain, redness, or irritation at site where injected tiredness vomiting This list may not describe all possible side effects. Call your doctor for medical advice about side effects. You may report side effects to FDA at 1-800-FDA-1088. Where should I keep my medication? This vaccine is only given by a health care provider. It will not be stored at home. NOTE: This sheet is a summary. It may not cover all possible information. If you have questions about this medicine, talk to your doctor, pharmacist, or health care provider.  2022 Elsevier/Gold Standard (2021-07-03 00:00:00)   Postnasal Drip Postnasal drip is the feeling of mucus going down the back of your throat. Mucus is a slimy substance that moistens and cleans your nose and throat, as well as the air pockets in face bones near your forehead and cheeks (sinuses). Small amounts  of mucus pass from your nose and sinuses down the back of your throat all the time. This is normal. When you produce too much mucus or the mucus gets too thick, you can feel it. Some common causes of postnasal drip include: Having more mucus because of: A cold or  the flu. Allergies. Cold air. Certain medicines. Having more mucus that is thicker because of: A sinus or nasal infection. Dry air. A food allergy. Follow these instructions at home: Relieving discomfort  Gargle with a salt-water mixture 3-4 times a day or as needed. To make a salt-water mixture, completely dissolve -1 tsp of salt in 1 cup of warm water. If the air in your home is dry, use a humidifier to add moisture to the air. Use a saline spray or container (neti pot) to flush out the nose (nasal irrigation). These methods can help clear away mucus and keep the nasal passages moist. General instructions Take over-the-counter and prescription medicines only as told by your health care provider. Follow instructions from your health care provider about eating or drinking restrictions. You may need to avoid caffeine. Avoid things that you know you are allergic to (allergens), like dust, mold, pollen, pets, or certain foods. Drink enough fluid to keep your urine pale yellow. Keep all follow-up visits as told by your health care provider. This is important. Contact a health care provider if: You have a fever. You have a sore throat. You have difficulty swallowing. You have headache. You have sinus pain. You have a cough that does not go away. The mucus from your nose becomes thick and is green or yellow in color. You have cold or flu symptoms that last more than 10 days. Summary Postnasal drip is the feeling of mucus going down the back of your throat. If your health care provider approves, use nasal irrigation or a nasal spray 2?4 times a day. Avoid things that you know you are allergic to (allergens), like dust, mold, pollen, pets, or certain foods. This information is not intended to replace advice given to you by your health care provider. Make sure you discuss any questions you have with your health care provider. Document Revised: 07/25/2020 Document Reviewed:  07/25/2020 Elsevier Patient Education  2022 Elsevier Inc.   Hoarseness Hoarseness, also called dysphonia, is any abnormal change in your voice that can make it difficult to speak. Your voice may sound raspy, breathy, or strained. Hoarseness is caused by a problem with your vocal cords (vocal folds). These are two bands of tissue inside your voice box (larynx). When you speak, your vocal cords move back and forth to create sound. The surfaces of your vocal cords need to be smooth for your voice to sound clear. Swelling or lumps on your vocal cords can cause hoarseness. Vocal cord problems may be the result of injuries or abnormal growths, certain diseases, upper respiratory infection, or allergies. Other causes may include medicine side effects and exposure to irritants. Follow these instructions at home: Pay attention to any changes in your symptoms. Take these actions to stay safe and to help relieve your symptoms: Lifestyle Do not eat foods that give you heartburn, such as spicy or acidic foods like hot peppers and orange juice. These foods can cause a gastroesophageal reflux that may worsen your vocal cord problems. Limit how much alcohol and caffeine you drink as told by your health care provider. Drink enough fluid to keep your urine pale yellow. Do not use any products that contain nicotine  or tobacco. These products include cigarettes, chewing tobacco, and vaping devices, such as e-cigarettes. If you need help quitting, ask your health care provider. Avoid secondhand smoke. General instructions Use a humidifier if the air in your home is dry. Avoid coughing or clearing your throat. Do not whisper. Whispering can cause muscle strain. Do not speak in a loud or harsh voice. Rest your voice. If recommended by your health care provider, schedule an appointment with a speech-language specialist. This specialist may give you methods to try that can help you avoid misusing your voice. Contact  a health care provider if: Your voice is hoarse longer than 2 weeks. You almost lose or completely lose your voice for more than 3 days. You have pain when you swallow or try to talk. You feel a lump in your neck. Get help right away if: You have trouble swallowing. You feel like you are choking when you swallow. You cough up blood or vomit blood. You have trouble breathing. You choke, cannot swallow, or cannot breathe if you lie flat. You notice swelling or a rash on your body, face, or tongue. These symptoms may represent a serious problem that is an emergency. Do not wait to see if the symptoms will go away. Get medical help right away. Call your local emergency services (911 in the [place] Do not drive yourself to the hospital. Summary Hoarseness, also called dysphonia, is any abnormal change in your voice that can make it difficult to speak. Your voice may sound raspy, breathy, or strained. Hoarseness is caused by a problem with your vocal cords (vocal folds). Do not speak in a loud or harsh voice, whisper, use nicotine or tobacco products, or eat foods that give you heartburn. See your health care provider if your hoarseness does not improve after 2 weeks. This information is not intended to replace advice given to you by your health care provider. Make sure you discuss any questions you have with your health care provider. Document Revised: 04/03/2021 Document Reviewed: 04/03/2021 Elsevier Patient Education  2022 [employer].

## 2021-09-18 ENCOUNTER — Other Ambulatory Visit: Payer: Medicare Other

## 2021-10-05 LAB — COLOGUARD: COLOGUARD: NEGATIVE

## 2021-10-09 ENCOUNTER — Other Ambulatory Visit: Payer: Self-pay

## 2021-10-09 ENCOUNTER — Other Ambulatory Visit (INDEPENDENT_AMBULATORY_CARE_PROVIDER_SITE_OTHER): Payer: Medicare Other

## 2021-10-09 DIAGNOSIS — Z1389 Encounter for screening for other disorder: Secondary | ICD-10-CM

## 2021-10-09 DIAGNOSIS — E785 Hyperlipidemia, unspecified: Secondary | ICD-10-CM | POA: Diagnosis not present

## 2021-10-09 DIAGNOSIS — Z1329 Encounter for screening for other suspected endocrine disorder: Secondary | ICD-10-CM

## 2021-10-09 DIAGNOSIS — I10 Essential (primary) hypertension: Secondary | ICD-10-CM | POA: Diagnosis not present

## 2021-10-09 LAB — COMPREHENSIVE METABOLIC PANEL
ALT: 12 U/L (ref 0–35)
AST: 20 U/L (ref 0–37)
Albumin: 4 g/dL (ref 3.5–5.2)
Alkaline Phosphatase: 75 U/L (ref 39–117)
BUN: 20 mg/dL (ref 6–23)
CO2: 30 mEq/L (ref 19–32)
Calcium: 9.5 mg/dL (ref 8.4–10.5)
Chloride: 103 mEq/L (ref 96–112)
Creatinine, Ser: 0.95 mg/dL (ref 0.40–1.20)
GFR: 56.86 mL/min — ABNORMAL LOW (ref >60.00)
Glucose, Bld: 86 mg/dL (ref 70–99)
Potassium: 3.8 mEq/L (ref 3.5–5.1)
Sodium: 141 mEq/L (ref 135–145)
Total Bilirubin: 0.5 mg/dL (ref 0.2–1.2)
Total Protein: 6.9 g/dL (ref 6.0–8.3)

## 2021-10-09 LAB — CBC WITH DIFFERENTIAL/PLATELET
Basophils Absolute: 0 10*3/uL (ref 0.0–0.1)
Basophils Relative: 0.9 % (ref 0.0–3.0)
Eosinophils Absolute: 0.1 10*3/uL (ref 0.0–0.7)
Eosinophils Relative: 1.5 % (ref 0.0–5.0)
HCT: 36.8 % (ref 36.0–46.0)
Hemoglobin: 12.1 g/dL (ref 12.0–15.0)
Lymphocytes Relative: 33.8 % (ref 12.0–46.0)
Lymphs Abs: 1.5 10*3/uL (ref 0.7–4.0)
MCHC: 33 g/dL (ref 30.0–36.0)
MCV: 95.9 fl (ref 78.0–100.0)
Monocytes Absolute: 0.5 10*3/uL (ref 0.1–1.0)
Monocytes Relative: 10.3 % (ref 3.0–12.0)
Neutro Abs: 2.4 10*3/uL (ref 1.4–7.7)
Neutrophils Relative %: 53.5 % (ref 43.0–77.0)
Platelets: 213 10*3/uL (ref 150.0–400.0)
RBC: 3.83 Mil/uL — ABNORMAL LOW (ref 3.87–5.11)
RDW: 13.5 % (ref 11.5–15.5)
WBC: 4.4 10*3/uL (ref 4.0–10.5)

## 2021-10-09 LAB — TSH: TSH: 1.5 u[IU]/mL (ref 0.35–5.50)

## 2021-10-09 LAB — LIPID PANEL
Cholesterol: 164 mg/dL (ref 0–200)
HDL: 64.5 mg/dL (ref >39.00)
LDL Cholesterol: 84 mg/dL (ref 0–99)
NonHDL: 99.08
Total CHOL/HDL Ratio: 3
Triglycerides: 73 mg/dL (ref 0.0–149.0)
VLDL: 14.6 mg/dL (ref 0.0–40.0)

## 2021-10-10 LAB — URINALYSIS, ROUTINE W REFLEX MICROSCOPIC
Bilirubin Urine: NEGATIVE
Glucose, UA: NEGATIVE
Hgb urine dipstick: NEGATIVE
Ketones, ur: NEGATIVE
Leukocytes,Ua: NEGATIVE
Nitrite: NEGATIVE
Protein, ur: NEGATIVE
Specific Gravity, Urine: 1.006 (ref 1.001–1.035)
pH: 6 (ref 5.0–8.0)

## 2021-12-12 ENCOUNTER — Ambulatory Visit: Payer: Medicare Other | Admitting: Internal Medicine

## 2021-12-18 ENCOUNTER — Ambulatory Visit (INDEPENDENT_AMBULATORY_CARE_PROVIDER_SITE_OTHER): Payer: Medicare Other | Admitting: Internal Medicine

## 2021-12-18 ENCOUNTER — Telehealth: Payer: Self-pay | Admitting: Internal Medicine

## 2021-12-18 ENCOUNTER — Encounter: Payer: Self-pay | Admitting: Internal Medicine

## 2021-12-18 ENCOUNTER — Other Ambulatory Visit: Payer: Self-pay | Admitting: Internal Medicine

## 2021-12-18 ENCOUNTER — Other Ambulatory Visit: Payer: Self-pay

## 2021-12-18 VITALS — BP 120/80 | HR 97 | Temp 99.1°F | Ht 63.0 in | Wt 113.8 lb

## 2021-12-18 DIAGNOSIS — R0982 Postnasal drip: Secondary | ICD-10-CM | POA: Diagnosis not present

## 2021-12-18 DIAGNOSIS — E2839 Other primary ovarian failure: Secondary | ICD-10-CM

## 2021-12-18 DIAGNOSIS — R490 Dysphonia: Secondary | ICD-10-CM | POA: Diagnosis not present

## 2021-12-18 DIAGNOSIS — J309 Allergic rhinitis, unspecified: Secondary | ICD-10-CM

## 2021-12-18 DIAGNOSIS — J9801 Acute bronchospasm: Secondary | ICD-10-CM

## 2021-12-18 DIAGNOSIS — I10 Essential (primary) hypertension: Secondary | ICD-10-CM

## 2021-12-18 DIAGNOSIS — J329 Chronic sinusitis, unspecified: Secondary | ICD-10-CM | POA: Diagnosis not present

## 2021-12-18 MED ORDER — SALINE SPRAY 0.65 % NA SOLN: 2.0000 | NASAL | 12 refills | Status: DC | PRN

## 2021-12-18 MED ORDER — AMLODIPINE BESYLATE 5 MG PO TABS: 5.0000 mg | ORAL_TABLET | Freq: Every day | ORAL | 3 refills | Status: DC

## 2021-12-18 MED ORDER — IPRATROPIUM BROMIDE 0.06 % NA SOLN: 2.0000 | Freq: Three times a day (TID) | NASAL | 12 refills | Status: DC

## 2021-12-18 MED ORDER — ALBUTEROL SULFATE HFA 108 (90 BASE) MCG/ACT IN AERS: 1.0000 | INHALATION_SPRAY | Freq: Four times a day (QID) | RESPIRATORY_TRACT | 2 refills | Status: DC | PRN

## 2021-12-18 NOTE — Progress Notes (Signed)
Chief Complaint  Patient presents with   Follow-up   F/u  1. Htn controlled on norvasc 5 mg qd  2. Reviewed labs 09/2021 and will call and schedule mammogram and bone density  3. C/o sinus pressure and pain chronically has 2 cats x 3 years recently c/o chest discomfort and tightness and sinus pain and hoarseness but had leftover Abx ? Name and sx's resolved she also c/o PND on prn flonase which helps with nasal congestion and will try nasal saline not tried atrovent yet    Review of Systems  Constitutional:  Negative for weight loss.  HENT:  Negative for hearing loss.   Eyes:  Negative for blurred vision.  Respiratory:  Negative for shortness of breath.   Cardiovascular:  Negative for chest pain.  Gastrointestinal:  Negative for abdominal pain and blood in stool.  Genitourinary:  Negative for dysuria.  Musculoskeletal:  Negative for falls and joint pain.  Skin:  Negative for rash.  Neurological:  Negative for headaches.  Psychiatric/Behavioral:  Negative for depression.   Past Medical History:  Diagnosis Date   Anemia    kid   Cataract    Constipation    COPD (chronic obstructive pulmonary disease) (HCC)    Frequent headaches    Glaucoma    Glaucoma    Hyperlipidemia    Hypertension    Pneumonia    11/2017    Postnasal drip    Past Surgical History:  Procedure Laterality Date   ABDOMINAL HYSTERECTOMY     TONSILLECTOMY     Family History  Problem Relation Age of Onset   Heart disease Mother    Heart murmur Mother        originated from typhoid disease   Stroke Mother    Stroke Father    Social History   Socioeconomic History   Marital status: Widowed    Spouse name: Not on file   Number of children: Not on file   Years of education: Not on file   Highest education level: Not on file  Occupational History   Not on file  Tobacco Use   Smoking status: Never   Smokeless tobacco: Never  Vaping Use   Vaping Use: Never used  Substance and Sexual Activity    Alcohol use: No   Drug use: No   Sexual activity: Never    Partners: Male  Other Topics Concern   Not on file  Social History Narrative   3 kids 25.46, 80 as of 09/05/21    Moved from [place] to be with daughter and grandchildren    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    Social Determinants of Health   Financial Resource Strain: Low Risk    Difficulty of Paying Living Expenses: Not hard at all  Food Insecurity: No Food Insecurity   Worried About Programme researcher, broadcasting/film/video in the Last Year: Never true   Barista in the Last Year: Never true  Transportation Needs: No Transportation Needs   Lack of Transportation (Medical): No   Lack of Transportation (Non-Medical): No  Physical Activity: Insufficiently Active   Days of Exercise per Week: 3 days   Minutes of Exercise per Session: 20 min  Stress: No Stress Concern Present   Feeling of Stress : Not at all  Social Connections: Unknown   Frequency of Communication with Friends and Family: More than three times a week   Frequency of Social Gatherings with Friends and Family: More than three times a  week   Attends Religious Services: Not on file   Active Member of Clubs or Organizations: Not on file   Attends Club or Organization Meetings: Not on file   Marital Status: Widowed  Catering manager Violence: Not At Risk   Fear of Current or Ex-Partner: No   Emotionally Abused: No   Physically Abused: No   Sexually Abused: No   Current Meds  Medication Sig   albuterol (VENTOLIN HFA) 108 (90 Base) MCG/ACT inhaler Inhale 1-2 puffs into the lungs every 6 (six) hours as needed for wheezing or shortness of breath.   conjugated estrogens (PREMARIN) 25 MG injection Inject 25 mg into the vein.   cyclobenzaprine (FLEXERIL) 5 MG tablet TAKE 1 TABLET (5 MG TOTAL) BY MOUTH AT BEDTIME AS NEEDED FOR MUSCLE SPASMS.   fluticasone (FLONASE) 50 MCG/ACT nasal spray Place 2 sprays into both nostrils daily. Prn nasal congestion   progesterone (PROMETRIUM)  200 MG capsule TAKE ONE CAPSULE BY MOUTH DAILY AT BEDTIME   sodium chloride (OCEAN) 0.65 % SOLN nasal spray Place 2 sprays into both nostrils as needed for congestion.   [DISCONTINUED] amLODipine (NORVASC) 5 MG tablet TAKE 1 TABLET BY MOUTH EVERY DAY   [DISCONTINUED] ipratropium (ATROVENT) 0.06 % nasal spray Place 2 sprays into both nostrils 3 (three) times daily. prn   No Known Allergies Recent Results (from the past 2160 hour(s))  Cologuard     Status: None   Collection Time: 10/01/21  9:15 AM  Result Value Ref Range   COLOGUARD Negative Negative    Comment:  NEGATIVE TEST RESULT. A negative Cologuard result indicates a low likelihood that a colorectal cancer (CRC) or advanced adenoma (adenomatous polyps with more advanced pre-malignant features)  is present. The chance that a person with a negative Cologuard test has a colorectal cancer is less than 1 in 1500 (negative predictive value >99.9%) or has an  advanced adenoma is less than  5.3% (negative predictive value 94.7%). These data are based on a prospective cross-sectional study of 10,000 individuals at average risk for colorectal cancer who were screened with both Cologuard and colonoscopy. (Imperiale T. et al, N Engl J Med 2014;370(14):1286-1297) The normal value (reference range) for this assay is negative.  COLOGUARD RE-SCREENING RECOMMENDATION: Periodic colorectal cancer screening is an important part of preventive healthcare for asymptomatic individuals at average risk for colorectal cancer.  Following a negative Cologuard result, the American Cancer Society and U.S.  Multi-Society Task Force screening guidelines recommend a Cologuard re-screening interval of 3 years.  References: American Cancer Society Guideline for Colorectal Cancer Screening: https://www.cancer.org/cancer/colon-rectal-cancer/detection-diagnosis-staging/acs-recommendations.html.; Rex DK, Boland CR, Dominitz JK, Colorectal Cancer Screening: Recommendations for  Physicians and Patients from the U.S. Multi-Society Task Force on Colorectal Cancer Screening , Am J Gastroenterology 2017; 112:1016-1030.  TEST DESCRIPTION: Composite algorithmic analysis of stool DNA-biomarkers with hemoglobin immunoassay.   Quantitative values of individual biomarkers are not reportable and are not associated with individual biomarker result reference ranges. Cologuard is intended for colorectal cancer screening of adults of either sex, 45 years or older, who are at average-risk for colorectal cancer (CRC). Cologuard has been approved for use by the U.S. FDA. The performance of Cologuard was  established in a cross sectional study of average-risk adults aged 4-84. Cologuard performance in patients ages 28 to 47 years was estimated by sub-group analysis of near-age groups. Colonoscopies performed for a positive result may find as the most clinically significant lesion: colorectal cancer [4.0%], advanced adenoma (including sessile serrated polyps greater than or equal  to 1cm diameter) [20%] or non- advanced adenoma [31%]; or no colorectal neoplasia [45%]. These estimates are derived from a prospective cross-sectional screening study of 10,000 individuals at average risk for colorectal cancer who were screened with both Cologuard and colonoscopy. (Imperiale T. et al, Macy Mis J Med 2014;370(14):1286-1297.) Cologuard may produce a false negative or false positive result (no colorectal cancer or precancerous polyp present at colonoscopy follow up). A negative Cologuard test result does not guarantee the absence of CRC or advanced adenoma (pre-cancer). The current Cologuard  screening interval is every 3 years. Science writer and U.S. Therapist, music). Cologuard performance data in a 10,000 patient pivotal study using colonoscopy as the reference method can be accessed at the following location: www.exactlabs.com/results. Additional description of the Cologuard test process,  warnings and precautions can be found at www.cologuard.com.   TSH     Status: None   Collection Time: 10/09/21  8:14 AM  Result Value Ref Range   TSH 1.50 0.35 - 5.50 uIU/mL  CBC with Differential/Platelet     Status: Abnormal   Collection Time: 10/09/21  8:14 AM  Result Value Ref Range   WBC 4.4 4.0 - 10.5 K/uL   RBC 3.83 (L) 3.87 - 5.11 Mil/uL   Hemoglobin 12.1 12.0 - 15.0 g/dL   HCT 46.5 03.5 - 46.5 %   MCV 95.9 78.0 - 100.0 fl   MCHC 33.0 30.0 - 36.0 g/dL   RDW 68.1 27.5 - 17.0 %   Platelets 213.0 150.0 - 400.0 K/uL   Neutrophils Relative % 53.5 43.0 - 77.0 %   Lymphocytes Relative 33.8 12.0 - 46.0 %   Monocytes Relative 10.3 3.0 - 12.0 %   Eosinophils Relative 1.5 0.0 - 5.0 %   Basophils Relative 0.9 0.0 - 3.0 %   Neutro Abs 2.4 1.4 - 7.7 K/uL   Lymphs Abs 1.5 0.7 - 4.0 K/uL   Monocytes Absolute 0.5 0.1 - 1.0 K/uL   Eosinophils Absolute 0.1 0.0 - 0.7 K/uL   Basophils Absolute 0.0 0.0 - 0.1 K/uL  Lipid panel     Status: None   Collection Time: 10/09/21  8:14 AM  Result Value Ref Range   Cholesterol 164 0 - 200 mg/dL    Comment: ATP III Classification       Desirable:  < 200 mg/dL               Borderline High:  200 - 239 mg/dL          High:  > = 017 mg/dL   Triglycerides 49.4 0.0 - 149.0 mg/dL    Comment: Normal:  <496 mg/dLBorderline High:  150 - 199 mg/dL   HDL 75.91 >63.84 mg/dL   VLDL 66.5 0.0 - 99.3 mg/dL   LDL Cholesterol 84 0 - 99 mg/dL   Total CHOL/HDL Ratio 3     Comment:                Men          Women1/2 Average Risk     3.4          3.3Average Risk          5.0          4.42X Average Risk          9.6          7.13X Average Risk          15.0  11.0                       NonHDL 99.08     Comment: NOTE:  Non-HDL goal should be 30 mg/dL higher than patient's LDL goal (i.e. LDL goal of < 70 mg/dL, would have non-HDL goal of < 100 mg/dL)  Comprehensive metabolic panel     Status: Abnormal   Collection Time: 10/09/21  8:14 AM  Result Value Ref Range    Sodium 141 135 - 145 mEq/L   Potassium 3.8 3.5 - 5.1 mEq/L   Chloride 103 96 - 112 mEq/L   CO2 30 19 - 32 mEq/L   Glucose, Bld 86 70 - 99 mg/dL   BUN 20 6 - 23 mg/dL   Creatinine, Ser 1.610.95 0.40 - 1.20 mg/dL   Total Bilirubin 0.5 0.2 - 1.2 mg/dL   Alkaline Phosphatase 75 39 - 117 U/L   AST 20 0 - 37 U/L   ALT 12 0 - 35 U/L   Total Protein 6.9 6.0 - 8.3 g/dL   Albumin 4.0 3.5 - 5.2 g/dL   GFR 09.6056.86 (L) >45.40>60.00 mL/min    Comment: Calculated using the CKD-EPI Creatinine Equation (2021)   Calcium 9.5 8.4 - 10.5 mg/dL  Urinalysis, Routine w reflex microscopic     Status: None   Collection Time: 10/09/21  8:14 AM  Result Value Ref Range   Color, Urine YELLOW YELLOW   APPearance CLEAR CLEAR   Specific Gravity, Urine 1.006 1.001 - 1.035   pH 6.0 5.0 - 8.0   Glucose, UA NEGATIVE NEGATIVE   Bilirubin Urine NEGATIVE NEGATIVE   Ketones, ur NEGATIVE NEGATIVE   Hgb urine dipstick NEGATIVE NEGATIVE   Protein, ur NEGATIVE NEGATIVE   Nitrite NEGATIVE NEGATIVE   Leukocytes,Ua NEGATIVE NEGATIVE   Objective  Body mass index is 20.16 kg/m. Wt Readings from Last 3 Encounters:  12/18/21 113 lb 12.8 oz (51.6 kg)  09/05/21 110 lb (49.9 kg)  07/17/21 110 lb (49.9 kg)   Temp Readings from Last 3 Encounters:  12/18/21 99.1 F (37.3 C) (Oral)  09/05/21 (!) 97 F (36.1 C) (Temporal)  09/26/20 98.6 F (37 C) (Oral)   BP Readings from Last 3 Encounters:  12/18/21 120/80  09/05/21 134/70  09/26/20 122/70   Pulse Readings from Last 3 Encounters:  12/18/21 97  09/05/21 (!) 57  09/26/20 91    Physical Exam Vitals and nursing note reviewed.  Constitutional:      Appearance: Normal appearance. She is well-developed and well-groomed.  HENT:     Head: Normocephalic and atraumatic.  Eyes:     Conjunctiva/sclera: Conjunctivae normal.     Pupils: Pupils are equal, round, and reactive to light.  Cardiovascular:     Rate and Rhythm: Normal rate and regular rhythm.     Heart sounds: Normal heart  sounds. No murmur heard. Pulmonary:     Effort: Pulmonary effort is normal.     Breath sounds: Normal breath sounds.  Abdominal:     General: Abdomen is flat. Bowel sounds are normal.     Tenderness: There is no abdominal tenderness.  Musculoskeletal:        General: No tenderness.  Skin:    General: Skin is warm and dry.  Neurological:     General: No focal deficit present.     Mental Status: She is alert and oriented to person, place, and time. Mental status is at baseline.     Cranial Nerves: Cranial  nerves 2-12 are intact.     Motor: Motor function is intact.     Coordination: Coordination is intact.     Gait: Gait is intact.  Psychiatric:        Attention and Perception: Attention and perception normal.        Mood and Affect: Mood and affect normal.        Speech: Speech normal.        Behavior: Behavior normal. Behavior is cooperative.        Thought Content: Thought content normal.        Cognition and Memory: Cognition and memory normal.        Judgment: Judgment normal.    Assessment  Plan  Allergic rhinitis, unspecified seasonality, unspecified trigger - Plan: sodium chloride (OCEAN) 0.65 % SOLN nasal spray, ipratropium (ATROVENT) 0.06 % nasal spray, Ambulatory referral to ENT  PND (post-nasal drip) - Plan: sodium chloride (OCEAN) 0.65 % SOLN nasal spray, ipratropium (ATROVENT) 0.06 % nasal spray, Ambulatory referral to ENT  Hoarseness of voice - Plan: Ambulatory referral to ENT  Chronic sinusitis, unspecified location - Plan: Ambulatory referral to ENT  Bronchospasm - Plan: albuterol (VENTOLIN HFA) 108 (90 Base) MCG/ACT inhaler  Essential hypertension controlled- Plan: amLODipine (NORVASC) 5 MG tablet   HM sch  Fasting labs [facility] and have labs from [facility] faxed to me Dr. Littie DeedsMaria Sulindro Pas[place] Ca. 05/16/20 Call back with name of eye doctor    Declines flu shot Declines covid vx prevnar utd, pna 23 vaccine utd  -given Rx shingrix (declines) and Tdap  declines for now until allergy/sinus issues resolved/improved   No pap s/p hysterectomy DUB  rec mammo need rec sch orders in   [place]ordered 09/05/21 call to schedule  Neg cologuard 03/2018 no FH colon cancer cologuard repeat neg 10/01/21     [place] in [place] Pt stated they could not find records rec call and sch with mammogram   skin check had 11/2016 in [place] declines as of 09/14/19 no issues  Provider: Dr. French Anaracy McLean-Scocuzza-Internal Medicine

## 2021-12-18 NOTE — Patient Instructions (Addendum)
[place] saline spray  Dr. Jenne Campus or [place] Consider allergy testing and scope of the nose and vocal cords  Phone Fax E-mail Address  [phone] Not available [address]   [place] [address]     Specialties

## 2021-12-18 NOTE — Telephone Encounter (Signed)
Referral not sent waiting on ofc notes. °

## 2022-02-05 ENCOUNTER — Telehealth: Payer: Self-pay | Admitting: Internal Medicine

## 2022-02-05 NOTE — Telephone Encounter (Signed)
Rejection Reason - Patient did not respond" ?Lynn Powell said on Feb 05, 2022 8:43 AM ? ?Msg from [place] ent  ?

## 2022-05-24 ENCOUNTER — Telehealth: Payer: Self-pay | Admitting: Internal Medicine

## 2022-05-24 NOTE — Telephone Encounter (Signed)
Pt notified that we lose kidney function as we age  Informed pt to avoid nsaids, stay hydrated with water 55-64 ounces of water Follow low salt diet & monitor bp goal <130/<80 Pt verbalized understanding to info

## 2022-05-24 NOTE — Telephone Encounter (Signed)
This can be age related we all lose kidney function with time  Best avoid nsaids  Stay hydrated with water 55-64 ounces daily  Low salt diet  Monitor blood pressure goal <130/<80

## 2022-05-24 NOTE — Telephone Encounter (Signed)
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

## 2022-07-02 ENCOUNTER — Ambulatory Visit (INDEPENDENT_AMBULATORY_CARE_PROVIDER_SITE_OTHER): Payer: Medicare Other | Admitting: Family Medicine

## 2022-07-02 ENCOUNTER — Encounter: Payer: Self-pay | Admitting: Family Medicine

## 2022-07-02 ENCOUNTER — Encounter: Payer: Medicare Other | Admitting: Family Medicine

## 2022-07-02 VITALS — BP 124/70 | HR 89 | Temp 98.3°F | Ht 63.0 in | Wt 102.2 lb

## 2022-07-02 DIAGNOSIS — I10 Essential (primary) hypertension: Secondary | ICD-10-CM | POA: Diagnosis not present

## 2022-07-02 DIAGNOSIS — R634 Abnormal weight loss: Secondary | ICD-10-CM | POA: Diagnosis not present

## 2022-07-02 NOTE — Assessment & Plan Note (Signed)
Well controlled.  Compliant with medications.  With recent weight loss and advancing age recommend trial of decreasing medication to off. Patient agreeable but would like to replace her BP monitor first. -Continue Amlodipine 5 mg daily -Plan to decrease at next visit and will monitor.  If tolerates and remains normotensive will discontinue  -Follow up in 3 months

## 2022-07-02 NOTE — Patient Instructions (Signed)
It was a pleasure meeting you today. Thank you for allowing me to take part in your health care.  Our goals for today as we discussed include:  Recommend you get a BP monitor cuff and monitor BP.  Record readings.  We can review at next visit.  I would like to decrease your Amlodipine if possible and if your blood pressure remains good.  Recommend increasing your diet to include proteins and carbohydrates.  This is decrease your fatigue.  Your weight today is 102 lbs which is down from 113 lbs at your last visit.  I would hold off on your cleanse for now.   Please bring in all your medications at your next visit.  Your lab work is due I Dec of 2023.  We can recheck you kidney function at that time however it remains stable and no indication for a nephology consult.     Recommend Shingles vaccine.  This is a 2 dose series and can be given at your local pharmacy.  Please talk to your pharmacist about this.   Please follow-up with PCP in 3 months  If you have any questions or concerns, please do not hesitate to call the office at [phone].  I look forward to our next visit and until then take care and stay safe.  Regards,   Dana Allan, MD   [facility]

## 2022-07-02 NOTE — Assessment & Plan Note (Addendum)
Unintentional weight loss.  Has been eating healthy and following holistic doctors plan.  Lost 11 lbs since last visit. Had cut out rice and other carbs.   Remains active.  -Cologuard UTD -Recommend to hold off on 14 day cleanse -Recommend to increase caloric intake.  Plans to restart eating as previous to increase weight -Mammogram and DEXA previously ordered.  Patient will need to call to schedule appointment. -Follow up in 2 months

## 2022-07-02 NOTE — Progress Notes (Signed)
    SUBJECTIVE:   CHIEF COMPLAINT / HPI: transfer of care  Patient presents to clinic to transfer care to new PCP  No acute concerns  HTN Asymptomatic.  Reports recent weight loss with change in diet.  Compliant with Amlodipine daily.    Weight loss Change in diet based on Holistic doctors recommendations.  Reports planning to start 14 day cleanse.  Has been seeing doctor in [place] for years and on herbal supplements based on specific blood work and diet.   PERTINENT  PMH / PSH:  HTN   OBJECTIVE:   BP 124/70 (BP Location: Left Arm, Patient Position: Sitting, Cuff Size: Normal)   Pulse 89   Temp 98.3 F (36.8 C) (Oral)   Ht 5\' 3"  (1.6 m)   Wt 102 lb 3.2 oz (46.4 kg)   SpO2 99%   BMI 18.10 kg/m    General: Alert, no acute distress HEENT: Left TM dull, Right TM normal.  No cervical lymphadenopathy Cardio: Normal S1 and S2, RRR, no r/m/g Pulm: CTAB, normal work of breathing Abdomen: Bowel sounds normal. Abdomen soft and non-tender.  Extremities: No peripheral edema.   ASSESSMENT/PLAN:   Essential hypertension Well controlled.  Compliant with medications.  With recent weight loss and advancing age recommend trial of decreasing medication to off. Patient agreeable but would like to replace her BP monitor first. -Continue Amlodipine 5 mg daily -Plan to decrease at next visit and will monitor.  If tolerates and remains normotensive will discontinue  -Follow up in 3 months  Weight loss Unintentional weight loss.  Has been eating healthy and following holistic doctors plan.  Lost 11 lbs since last visit. Had cut out rice and other carbs.   Remains active.  -Cologuard UTD -Recommend to hold off on 14 day cleanse -Recommend to increase caloric intake.  Plans to restart eating as previous to increase weight -Mammogram and DEXA previously ordered.  Patient will need to call to schedule appointment. -Follow up in 2 months   HCM Declined Shingles, PNA  and Flu  vaccine Cologuard 12/22, negative screening.  Repeat in 2025 Mammogram, Dexa previously ordered 11/22, will follow up with patient to schedule  PDMP Reviewed  12/22, MD

## 2022-07-03 ENCOUNTER — Telehealth: Payer: Self-pay

## 2022-07-03 ENCOUNTER — Encounter: Payer: Medicare Other | Admitting: Internal Medicine

## 2022-07-03 NOTE — Telephone Encounter (Signed)
LMTCB to let her know she needs to call [facility] and schedule her mammogram and bone scan before 11/23.

## 2022-07-03 NOTE — Telephone Encounter (Signed)
Noted  

## 2022-07-03 NOTE — Telephone Encounter (Signed)
Patient returned office phone call and note was read. Gave patient the phone number for [place].

## 2022-07-03 NOTE — Telephone Encounter (Signed)
-----   Message from Dana Allan, MD sent at 07/02/2022  2:50 PM EDT ----- Please let patient know that a Mammogram and bone scan was previously ordered.  She can call to have this scheduled before November.  Thanks

## 2022-07-24 ENCOUNTER — Ambulatory Visit: Payer: Medicare Other

## 2022-07-30 ENCOUNTER — Ambulatory Visit (INDEPENDENT_AMBULATORY_CARE_PROVIDER_SITE_OTHER): Payer: Medicare Other

## 2022-07-30 VITALS — Ht 63.0 in | Wt 102.0 lb

## 2022-07-30 DIAGNOSIS — Z0000 Encounter for general adult medical examination without abnormal findings: Secondary | ICD-10-CM

## 2022-07-30 NOTE — Progress Notes (Signed)
Subjective:   Lynn Powell is a 81 y.o. female who presents for Medicare Annual (Subsequent) preventive examination.  Review of Systems    No ROS.  Medicare Wellness Virtual Visit.  Visual/audio telehealth visit, UTA vital signs.   See social history for additional risk factors.   Cardiac Risk Factors include: advanced age (>85men, >43 women);hypertension     Objective:    Today's Vitals   07/30/22 0838  Weight: 102 lb (46.3 kg)  Height: 5\' 3"  (1.6 m)   Body mass index is 18.07 kg/m.     07/30/2022    8:42 AM 07/17/2021    9:26 AM 07/13/2020    2:42 PM 08/11/2019    4:08 PM 07/13/2019   12:21 PM 07/01/2018    8:19 AM  Advanced Directives  Does Patient Have a Medical Advance Directive? No No No No No No  Does patient want to make changes to medical advance directive?      Yes (MAU/Ambulatory/Procedural Areas - Information given)  Would patient like information on creating a medical advance directive? No - Patient declined No - Patient declined No - Patient declined  No - Patient declined     Current Medications (verified) Outpatient Encounter Medications as of 07/30/2022  Medication Sig   amLODipine (NORVASC) 5 MG tablet Take 1 tablet (5 mg total) by mouth daily. In the am   conjugated estrogens (PREMARIN) 25 MG injection Inject 25 mg into the vein.   progesterone (PROMETRIUM) 200 MG capsule TAKE ONE CAPSULE BY MOUTH DAILY AT BEDTIME   [DISCONTINUED] albuterol (VENTOLIN HFA) 108 (90 Base) MCG/ACT inhaler Inhale 1-2 puffs into the lungs every 6 (six) hours as needed for wheezing or shortness of breath.   No facility-administered encounter medications on file as of 07/30/2022.    Allergies (verified) Patient has no known allergies.   History: Past Medical History:  Diagnosis Date   Anemia    kid   Cataract    Constipation    COPD (chronic obstructive pulmonary disease) (HCC)    Frequent headaches    Glaucoma    Glaucoma    Hyperlipidemia    Hypertension     Pneumonia    11/2017    Postnasal drip    Past Surgical History:  Procedure Laterality Date   ABDOMINAL HYSTERECTOMY     TONSILLECTOMY     Family History  Problem Relation Age of Onset   Heart disease Mother    Heart murmur Mother        originated from typhoid disease   Stroke Mother    Stroke Father    Social History   Socioeconomic History   Marital status: Widowed    Spouse name: Not on file   Number of children: Not on file   Years of education: Not on file   Highest education level: Not on file  Occupational History   Not on file  Tobacco Use   Smoking status: Never   Smokeless tobacco: Never  Vaping Use   Vaping Use: Never used  Substance and Sexual Activity   Alcohol use: No   Drug use: No   Sexual activity: Never    Partners: Male  Other Topics Concern   Not on file  Social History Narrative   3 kids 46.46, 59 as of 09/05/21    Moved from 13/9/22 to be with daughter and grandchildren    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    Social Determinants of Health   Financial Resource Strain: Low  Risk  (07/30/2022)   Overall Financial Resource Strain (CARDIA)    Difficulty of Paying Living Expenses: Not hard at all  Food Insecurity: No Food Insecurity (07/30/2022)   Hunger Vital Sign    Worried About Running Out of Food in the Last Year: Never true    Ran Out of Food in the Last Year: Never true  Transportation Needs: No Transportation Needs (07/30/2022)   PRAPARE - Administrator, Civil Service (Medical): No    Lack of Transportation (Non-Medical): No  Physical Activity: Insufficiently Active (07/17/2021)   Exercise Vital Sign    Days of Exercise per Week: 3 days    Minutes of Exercise per Session: 20 min  Stress: No Stress Concern Present (07/30/2022)   [employer] of Occupational Health - Occupational Stress Questionnaire    Feeling of Stress : Not at all  Social Connections: Unknown (07/30/2022)   Social Connection and Isolation Panel  [NHANES]    Frequency of Communication with Friends and Family: More than three times a week    Frequency of Social Gatherings with Friends and Family: More than three times a week    Attends Religious Services: Not on Marketing executive or Organizations: Not on file    Attends Banker Meetings: Not on file    Marital Status: Not on file    Tobacco Counseling Counseling given: Not Answered   Clinical Intake:  Pre-visit preparation completed: Yes        Diabetes: No  How often do you need to have someone help you when you read instructions, pamphlets, or other written materials from your doctor or pharmacy?: 1 - Never  Interpreter Needed?: No      Activities of Daily Living    07/30/2022    8:35 AM  In your present state of health, do you have any difficulty performing the following activities:  Hearing? 0  Vision? 0  Difficulty concentrating or making decisions? 0  Walking or climbing stairs? 0  Dressing or bathing? 0  Doing errands, shopping? 0  Preparing Food and eating ? N  Using the Toilet? N  In the past six months, have you accidently leaked urine? N  Do you have problems with loss of bowel control? N  Managing your Medications? N  Managing your Finances? N  Housekeeping or managing your Housekeeping? N    Patient Care Team: Dana Allan, MD as PCP - General (Family Medicine)  Indicate any recent Medical Services you may have received from other than [facility] providers in the past year (date may be approximate).     Assessment:   This is a routine wellness examination for [place].  I connected with  Carolynn Comment on 07/30/22 by a audio enabled telemedicine application and verified that I am speaking with the correct person using two identifiers.  Patient Location: Home  Provider Location: Office/Clinic  I discussed the limitations of evaluation and management by telemedicine. The patient expressed understanding and agreed to  proceed.   Hearing/Vision screen Hearing Screening - Comments:: Patient is able to hear conversational tones without difficulty. No issues reported. Vision Screening - Comments:: Followed by [facility]  Wears corrective lenses when reading They have regular follow up with the ophthalmologist  Dietary issues and exercise activities discussed: Current Exercise Habits: Home exercise routine, Type of exercise: walking, Frequency (Times/Week): 3, Intensity: Mild   Goals Addressed  This Visit's Progress     Patient Stated     Maintain Healthy Lifestyle (pt-stated)   On track     Stay active         Depression Screen    07/30/2022    8:33 AM 07/02/2022    1:05 PM 12/18/2021    3:08 PM 07/17/2021    9:10 AM 07/13/2020    2:10 PM 03/16/2020    1:33 PM 07/13/2019   12:12 PM  PHQ 2/9 Scores  PHQ - 2 Score 0 0 0 0 0 0 0    Fall Risk    07/30/2022    8:37 AM 07/02/2022    1:05 PM 12/18/2021    3:08 PM 09/05/2021    2:20 PM 07/17/2021    9:13 AM  Fall Risk   Falls in the past year? 0 0 0 0 0  Number falls in past yr: 0 0 0 0   Injury with Fall? 0 0 0 0 0  Risk for fall due to :  No Fall Risks No Fall Risks No Fall Risks   Follow up Falls evaluation completed Falls evaluation completed Falls evaluation completed Falls evaluation completed Falls evaluation completed    FALL RISK PREVENTION PERTAINING TO THE HOME: Home free of loose throw rugs in walkways, pet beds, electrical cords, etc? Yes  Adequate lighting in your home to reduce risk of falls? Yes   ASSISTIVE DEVICES UTILIZED TO PREVENT FALLS: Life alert? No  Use of a cane, walker or w/c? No   TIMED UP AND GO: Was the test performed? No .   Cognitive Function: Patient is alert and oriented x3.  Manages her own finances and medications.  100% independent, lives alone.  Denies difficulty focusing, making decisions, memory loss.      07/01/2018    8:19 AM  MMSE - Mini Mental State Exam   Orientation to time 5  Orientation to Place 5  Registration 3  Attention/ Calculation 5  Recall 3  Language- name 2 objects 2  Language- repeat 1  Language- follow 3 step command 3  Language- read & follow direction 1  Write a sentence 1  Copy design 1  Total score 30        07/30/2022    8:48 AM 07/13/2019   12:22 PM  6CIT Screen  What Year? 0 points 0 points  What month? 0 points 0 points  What time? 0 points 0 points  Count back from 20  0 points  Months in reverse  0 points  Repeat phrase  0 points  Total Score  0 points    Immunizations Immunization History  Administered Date(s) Administered   Pneumococcal Conjugate-13 02/02/2018   Pneumococcal Polysaccharide-23 09/14/2019   Screening Tests Health Maintenance  Topic Date Due   DEXA SCAN  Never done   Zoster Vaccines- Shingrix (1 of 2) 10/01/2022 (Originally 12/17/1959)   INFLUENZA VACCINE  01/26/2023 (Originally 05/28/2022)   Pneumonia Vaccine 49+ Years old  Completed   HPV VACCINES  Aged Out   TETANUS/TDAP  Discontinued   COVID-19 Vaccine  Discontinued   Health Maintenance Health Maintenance Due  Topic Date Due   DEXA SCAN  Never done   Bone density- previously ordered. Agrees to schedule.    Lung Cancer Screening: (Low Dose CT Chest recommended if Age 31-80 years, 30 pack-year currently smoking OR have quit w/in 15years.) does not qualify.   Hepatitis C Screening: does not qualify.  Vision Screening: Recommended annual ophthalmology  exams for early detection of glaucoma and other disorders of the eye.  Dental Screening: Recommended annual dental exams for proper oral hygiene  Community Resource Referral / Chronic Care Management: CRR required this visit?  No   CCM required this visit?  No      Plan:    I have personally reviewed and noted the following in the patient's chart:   Medical and social history Use of alcohol, tobacco or illicit drugs  Current medications and supplements  including opioid prescriptions. Patient is not currently taking opioid prescriptions. Functional ability and status Nutritional status Physical activity Advanced directives List of other physicians Hospitalizations, surgeries, and ER visits in previous 12 months Vitals Screenings to include cognitive, depression, and falls Referrals and appointments  In addition, I have reviewed and discussed with patient certain preventive protocols, quality metrics, and best practice recommendations. A written personalized care plan for preventive services as well as general preventive health recommendations were provided to patient.     Varney Biles, LPN   22/11/9796

## 2022-07-30 NOTE — Patient Instructions (Addendum)
Lynn Powell , Thank you for taking time to come for your Medicare Wellness Visit. I appreciate your ongoing commitment to your health goals. Please review the following plan we discussed and let me know if I can assist you in the future.   These are the goals we discussed:  Goals       Patient Stated     Maintain Healthy Lifestyle (pt-stated)      Stay active          This is a list of the screening recommended for you and due dates:  Health Maintenance  Topic Date Due   DEXA scan (bone density measurement)  Never done   Zoster (Shingles) Vaccine (1 of 2) 10/01/2022*   Flu Shot  01/26/2023*   Pneumonia Vaccine  Completed   HPV Vaccine  Aged Out   Tetanus Vaccine  Discontinued   COVID-19 Vaccine  Discontinued  *Topic was postponed. The date shown is not the original due date.    Advanced directives: not yet completed  Conditions/risks identified: none at this time  Next appointment: Follow up in one year for your annual wellness visit    Preventive Care 65 Years and Older, Female Preventive care refers to lifestyle choices and visits with your health care provider that can promote health and wellness. What does preventive care include? A yearly physical exam. This is also called an annual well check. Dental exams once or twice a year. Routine eye exams. Ask your health care provider how often you should have your eyes checked. Personal lifestyle choices, including: Daily care of your teeth and gums. Regular physical activity. Eating a healthy diet. Avoiding tobacco and drug use. Limiting alcohol use. Practicing safe sex. Taking low-dose aspirin every day. Taking vitamin and mineral supplements as recommended by your health care provider. What happens during an annual well check? The services and screenings done by your health care provider during your annual well check will depend on your age, overall health, lifestyle risk factors, and family history of  disease. Counseling  Your health care provider may ask you questions about your: Alcohol use. Tobacco use. Drug use. Emotional well-being. Home and relationship well-being. Sexual activity. Eating habits. History of falls. Memory and ability to understand (cognition). Work and work Statistician. Reproductive health. Screening  You may have the following tests or measurements: Height, weight, and BMI. Blood pressure. Lipid and cholesterol levels. These may be checked every 5 years, or more frequently if you are over 76 years old. Skin check. Lung cancer screening. You may have this screening every year starting at age 60 if you have a 30-pack-year history of smoking and currently smoke or have quit within the past 15 years. Fecal occult blood test (FOBT) of the stool. You may have this test every year starting at age 68. Flexible sigmoidoscopy or colonoscopy. You may have a sigmoidoscopy every 5 years or a colonoscopy every 10 years starting at age 18. Hepatitis C blood test. Hepatitis B blood test. Sexually transmitted disease (STD) testing. Diabetes screening. This is done by checking your blood sugar (glucose) after you have not eaten for a while (fasting). You may have this done every 1-3 years. Bone density scan. This is done to screen for osteoporosis. You may have this done starting at age 57. Mammogram. This may be done every 1-2 years. Talk to your health care provider about how often you should have regular mammograms. Talk with your health care provider about your test results, treatment options, and  if necessary, the need for more tests. Vaccines  Your health care provider may recommend certain vaccines, such as: Influenza vaccine. This is recommended every year. Tetanus, diphtheria, and acellular pertussis (Tdap, Td) vaccine. You may need a Td booster every 10 years. Zoster vaccine. You may need this after age 30. Pneumococcal 13-valent conjugate (PCV13) vaccine. One  dose is recommended after age 36. Pneumococcal polysaccharide (PPSV23) vaccine. One dose is recommended after age 79. Talk to your health care provider about which screenings and vaccines you need and how often you need them. This information is not intended to replace advice given to you by your health care provider. Make sure you discuss any questions you have with your health care provider. Document Released: 11/10/2015 Document Revised: 07/03/2016 Document Reviewed: 08/15/2015 Elsevier Interactive Patient Education  2017 [place] Prevention in the Home Falls can cause injuries. They can happen to people of all ages. There are many things you can do to make your home safe and to help prevent falls. What can I do on the outside of my home? Regularly fix the edges of walkways and driveways and fix any cracks. Remove anything that might make you trip as you walk through a door, such as a raised step or threshold. Trim any bushes or trees on the path to your home. Use bright outdoor lighting. Clear any walking paths of anything that might make someone trip, such as rocks or tools. Regularly check to see if handrails are loose or broken. Make sure that both sides of any steps have handrails. Any raised decks and porches should have guardrails on the edges. Have any leaves, snow, or ice cleared regularly. Use sand or salt on walking paths during winter. Clean up any spills in your garage right away. This includes oil or grease spills. What can I do in the bathroom? Use night lights. Install grab bars by the toilet and in the tub and shower. Do not use towel bars as grab bars. Use non-skid mats or decals in the tub or shower. If you need to sit down in the shower, use a plastic, non-slip stool. Keep the floor dry. Clean up any water that spills on the floor as soon as it happens. Remove soap buildup in the tub or shower regularly. Attach bath mats securely with double-sided  non-slip rug tape. Do not have throw rugs and other things on the floor that can make you trip. What can I do in the bedroom? Use night lights. Make sure that you have a light by your bed that is easy to reach. Do not use any sheets or blankets that are too big for your bed. They should not hang down onto the floor. Have a firm chair that has side arms. You can use this for support while you get dressed. Do not have throw rugs and other things on the floor that can make you trip. What can I do in the kitchen? Clean up any spills right away. Avoid walking on wet floors. Keep items that you use a lot in easy-to-reach places. If you need to reach something above you, use a strong step stool that has a grab bar. Keep electrical cords out of the way. Do not use floor polish or wax that makes floors slippery. If you must use wax, use non-skid floor wax. Do not have throw rugs and other things on the floor that can make you trip. What can I do with my stairs? Do not leave any  items on the stairs. Make sure that there are handrails on both sides of the stairs and use them. Fix handrails that are broken or loose. Make sure that handrails are as long as the stairways. Check any carpeting to make sure that it is firmly attached to the stairs. Fix any carpet that is loose or worn. Avoid having throw rugs at the top or bottom of the stairs. If you do have throw rugs, attach them to the floor with carpet tape. Make sure that you have a light switch at the top of the stairs and the bottom of the stairs. If you do not have them, ask someone to add them for you. What else can I do to help prevent falls? Wear shoes that: Do not have high heels. Have rubber bottoms. Are comfortable and fit you well. Are closed at the toe. Do not wear sandals. If you use a stepladder: Make sure that it is fully opened. Do not climb a closed stepladder. Make sure that both sides of the stepladder are locked into place. Ask  someone to hold it for you, if possible. Clearly mark and make sure that you can see: Any grab bars or handrails. First and last steps. Where the edge of each step is. Use tools that help you move around (mobility aids) if they are needed. These include: Canes. Walkers. Scooters. Crutches. Turn on the lights when you go into a dark area. Replace any light bulbs as soon as they burn out. Set up your furniture so you have a clear path. Avoid moving your furniture around. If any of your floors are uneven, fix them. If there are any pets around you, be aware of where they are. Review your medicines with your doctor. Some medicines can make you feel dizzy. This can increase your chance of falling. Ask your doctor what other things that you can do to help prevent falls. This information is not intended to replace advice given to you by your health care provider. Make sure you discuss any questions you have with your health care provider. Document Released: 08/10/2009 Document Revised: 03/21/2016 Document Reviewed: 11/18/2014 Elsevier Interactive Patient Education  2017 [employer].

## 2022-08-02 ENCOUNTER — Encounter: Payer: Medicare Other | Admitting: Family

## 2022-09-26 LAB — MICROALBUMIN, URINE: Microalb, Ur: 21

## 2022-09-26 LAB — IRON,TIBC AND FERRITIN PANEL
Ferritin: 17
Iron: 70
TIBC: 440
UIBC: 370

## 2022-09-26 LAB — CBC AND DIFFERENTIAL
HCT: 38 (ref 36–46)
Hemoglobin: 12.4 (ref 12.0–16.0)
WBC: 4

## 2022-09-26 LAB — LIPID PANEL
Cholesterol: 186 (ref 0–200)
HDL: 85 — AB (ref 35–70)
LDL Cholesterol: 105
Triglycerides: 53 (ref 40–160)

## 2022-09-26 LAB — CBC: RBC: 3.98 (ref 3.87–5.11)

## 2022-09-26 LAB — BASIC METABOLIC PANEL
BUN: 14 (ref 4–21)
CO2: 27 — AB (ref 13–22)
Chloride: 104 (ref 99–108)
Creatinine: 0.7 (ref 0.5–1.1)
Glucose: 93
Potassium: 3.9 mEq/L (ref 3.5–5.1)
Sodium: 142 (ref 137–147)

## 2022-09-26 LAB — HEPATIC FUNCTION PANEL
ALT: 24 U/L (ref 7–35)
AST: 29 (ref 13–35)
Alkaline Phosphatase: 101 (ref 25–125)
Bilirubin, Direct: 0.1
Bilirubin, Total: 0.4

## 2022-09-26 LAB — PROTEIN / CREATININE RATIO, URINE
Albumin, U: 8
Creatinine, Urine: 39

## 2022-09-26 LAB — TESTOSTERONE: Testosterone: 25.8

## 2022-09-26 LAB — VITAMIN D 25 HYDROXY (VIT D DEFICIENCY, FRACTURES): Vit D, 25-Hydroxy: 72

## 2022-09-26 LAB — TSH: TSH: 1.29 (ref 0.41–5.90)

## 2022-09-26 LAB — COMPREHENSIVE METABOLIC PANEL
Albumin: 4.5 (ref 3.5–5.0)
Calcium: 9.9 (ref 8.7–10.7)

## 2022-09-26 LAB — VITAMIN B12: Vitamin B-12: 1580

## 2022-09-26 LAB — HEMOGLOBIN A1C: Hemoglobin A1C: 5.1

## 2022-10-01 ENCOUNTER — Ambulatory Visit: Payer: Medicare Other | Admitting: Family Medicine

## 2022-10-12 LAB — LAB REPORT - SCANNED
A1c: 5.1
EGFR: 82

## 2023-01-01 ENCOUNTER — Ambulatory Visit: Payer: Medicare Other | Admitting: Nurse Practitioner

## 2023-01-02 ENCOUNTER — Encounter: Payer: Self-pay | Admitting: Intensive Care

## 2023-01-02 ENCOUNTER — Emergency Department: Payer: Medicare Other

## 2023-01-02 ENCOUNTER — Emergency Department
Admission: EM | Admit: 2023-01-02 | Discharge: 2023-01-02 | Discharge status: Against Medical Advice | Payer: Medicare Other | Attending: Emergency Medicine | Admitting: Emergency Medicine

## 2023-01-02 ENCOUNTER — Other Ambulatory Visit: Payer: Self-pay

## 2023-01-02 DIAGNOSIS — R0602 Shortness of breath: Secondary | ICD-10-CM | POA: Insufficient documentation

## 2023-01-02 DIAGNOSIS — R059 Cough, unspecified: Secondary | ICD-10-CM | POA: Insufficient documentation

## 2023-01-02 DIAGNOSIS — Z20822 Contact with and (suspected) exposure to covid-19: Secondary | ICD-10-CM | POA: Diagnosis not present

## 2023-01-02 DIAGNOSIS — Z5321 Procedure and treatment not carried out due to patient leaving prior to being seen by health care provider: Secondary | ICD-10-CM | POA: Diagnosis not present

## 2023-01-02 DIAGNOSIS — R079 Chest pain, unspecified: Secondary | ICD-10-CM | POA: Diagnosis not present

## 2023-01-02 LAB — RESP PANEL BY RT-PCR (RSV, FLU A&B, COVID)  RVPGX2
Influenza A by PCR: NEGATIVE
Influenza B by PCR: NEGATIVE
Resp Syncytial Virus by PCR: NEGATIVE
SARS Coronavirus 2 by RT PCR: NEGATIVE

## 2023-01-02 LAB — CBC
HCT: 37.3 % (ref 36.0–46.0)
Hemoglobin: 12 g/dL (ref 12.0–15.0)
MCH: 30.5 pg (ref 26.0–34.0)
MCHC: 32.2 g/dL (ref 30.0–36.0)
MCV: 94.7 fL (ref 80.0–100.0)
Platelets: 233 10*3/uL (ref 150–400)
RBC: 3.94 MIL/uL (ref 3.87–5.11)
RDW: 14.6 % (ref 11.5–15.5)
WBC: 4.8 10*3/uL (ref 4.0–10.5)
nRBC: 0 % (ref 0.0–0.2)

## 2023-01-02 LAB — BASIC METABOLIC PANEL
Anion gap: 8 (ref 5–15)
BUN: 24 mg/dL — ABNORMAL HIGH (ref 8–23)
CO2: 25 mmol/L (ref 22–32)
Calcium: 9.5 mg/dL (ref 8.9–10.3)
Chloride: 103 mmol/L (ref 98–111)
Creatinine, Ser: 0.71 mg/dL (ref 0.44–1.00)
GFR, Estimated: >60 mL/min (ref >60)
Glucose, Bld: 118 mg/dL — ABNORMAL HIGH (ref 70–99)
Potassium: 3.7 mmol/L (ref 3.5–5.1)
Sodium: 136 mmol/L (ref 135–145)

## 2023-01-02 LAB — TROPONIN I (HIGH SENSITIVITY)
Troponin I (High Sensitivity): 4 ng/L (ref <18)
Troponin I (High Sensitivity): 5 ng/L (ref <18)

## 2023-01-02 NOTE — ED Notes (Signed)
Pt requesting to go home and states ride is 5 minutes away. Pt ambulatory in lobby and is visualized ambulating from department.

## 2023-01-02 NOTE — ED Triage Notes (Signed)
Patient c/o feeling like an elephant is sitting on her chest and SOB X2 days. Denies fevers. Reports new cough

## 2023-01-03 ENCOUNTER — Telehealth: Payer: Self-pay | Admitting: Family Medicine

## 2023-01-03 NOTE — Telephone Encounter (Signed)
Pt called in staying that she went to the ED yesterday for SOB and they did all kind of test on her, however as per notes, pt left the ED before been discharge, today she called in asking to see a nurse in person, not over the phone(as I offered access nurse). We don't have any appt opening for today by the time she called in. I offered another [facility] in [place], pt denied, I also offered [facility], as per pt, she keeps calling they are full. I transferred her Shona Simpson, CMA.

## 2023-01-03 NOTE — Telephone Encounter (Signed)
Spoke with pt and she was advised to go to the urgent care as she was wanting abx, she was advised that she needed to be evaluated before a provider could send in any abx. She wanted to see if we had appts Monday, pt was advised that if she is having SOB and in belief that she needs an abx then she should be seen a lot sooner so that she can be prescribed the appropriate meds and start on those as soon as possible; pt verbalized understanding and stated that she will go to the urgent care across the street.

## 2023-01-30 ENCOUNTER — Encounter: Payer: Self-pay | Admitting: Family Medicine

## 2023-01-30 ENCOUNTER — Ambulatory Visit (INDEPENDENT_AMBULATORY_CARE_PROVIDER_SITE_OTHER): Payer: Medicare Other | Admitting: Family Medicine

## 2023-01-30 VITALS — BP 121/60 | HR 83 | Temp 98.4°F | Ht 63.0 in | Wt 106.4 lb

## 2023-01-30 DIAGNOSIS — I10 Essential (primary) hypertension: Secondary | ICD-10-CM

## 2023-01-30 DIAGNOSIS — Z1231 Encounter for screening mammogram for malignant neoplasm of breast: Secondary | ICD-10-CM | POA: Diagnosis not present

## 2023-01-30 DIAGNOSIS — M545 Low back pain, unspecified: Secondary | ICD-10-CM | POA: Diagnosis not present

## 2023-01-30 DIAGNOSIS — E2839 Other primary ovarian failure: Secondary | ICD-10-CM

## 2023-01-30 MED ORDER — BACLOFEN 5 MG PO TABS: 5.0000 mg | ORAL_TABLET | Freq: Every evening | ORAL | 0 refills | Status: DC | PRN

## 2023-01-30 NOTE — Patient Instructions (Addendum)
It was a pleasure meeting you today. Thank you for allowing me to take part in your health care.  Our goals for today as we discussed include:  Continue Amlodipine 5 mg daily  Recommend Tetanus Vaccination.  This is given every 10 years.   Recommend Shingles vaccine.  This is a 2 dose series and can be given at your local pharmacy.  Please talk to your pharmacist about this.   Referral sent for Mammogram and Bone scan. Please call to schedule appointment. [facility] [place], [place] [address] [phone]   Please arrive 15 minutes prior to your appointment.  Arrivals 5 minutes past your appointment time will need to be rescheduled.  This is to ensure that all patients are seen in a timely manner.  Thank you for your understanding and cooperation.    If you have any questions or concerns, please do not hesitate to call the office at [phone].  I look forward to our next visit and until then take care and stay safe.  Regards,   Carollee Leitz, MD   [facility]

## 2023-01-30 NOTE — Progress Notes (Signed)
SUBJECTIVE:   Chief Complaint  Patient presents with   Medical Management of Chronic Issues    Results/ back pain refill on medicaiotn   HPI Patient presents to care for follow-up chronic disease management  Low back pain Chronic issue.  Previously been taken Flexeril 10 mg as needed.  Requesting refills.  Reports history of scoliosis. Sometimes flares up after long day at work.  Sometimes she will have difficulty sleeping with flareup and takes the Flexeril to help sleep.  She reports limited use of Flexeril as it makes her feel drowsy the following day.  She is willing to try something different today.  Denies any fevers, incontinence of bowel or bladder or saddle anesthesia.  Hypertension Asymptomatic.  Blood pressure at home similar to office visit today.  Denies any visual changes, headache, chest pain, shortness of breath or lower extremity edema.  Takes Norvasc 5 mg daily and doing well.  Has been following with Dr. Littie Deeds, MD in [place] for a number of years.  She recently has had blood work and enzymes completed.  Per patient, MD plans to provide supplementation based on recent labs.    PERTINENT PMH / PSH: Chronic low back pain Hypertension  OBJECTIVE:  BP 121/60   Pulse 83   Temp 98.4 F (36.9 C) (Oral)   Ht  (1.6 m)   Wt 106 lb 6.4 oz (48.3 kg)   SpO2 95%   BMI 18.85 kg/m    Physical Exam Vitals reviewed.  Constitutional:      General: She is not in acute distress.    Appearance: Normal appearance. She is normal weight. She is not ill-appearing, toxic-appearing or diaphoretic.  Eyes:     General:        Right eye: No discharge.        Left eye: No discharge.     Conjunctiva/sclera: Conjunctivae normal.  Cardiovascular:     Rate and Rhythm: Normal rate and regular rhythm.     Heart sounds: Normal heart sounds.  Pulmonary:     Effort: Pulmonary effort is normal.     Breath sounds: Normal breath sounds.  Abdominal:     General: Bowel  sounds are normal.  Musculoskeletal:        General: Normal range of motion.  Skin:    General: Skin is warm and dry.  Neurological:     General: No focal deficit present.     Mental Status: She is alert and oriented to person, place, and time. Mental status is at baseline.  Psychiatric:        Mood and Affect: Mood normal.        Behavior: Behavior normal.        Thought Content: Thought content normal.        Judgment: Judgment normal.     ASSESSMENT/PLAN:  Essential hypertension Assessment & Plan: Chronic.  Well controlled on current medication.  -Continue Amlodipine 5 mg daily -Monitor blood pressure at home.  Goal less than 140/90 per JNC 8 guidelines for age -Follow up in 6 months   Low back pain without sciatica, unspecified back pain laterality, unspecified chronicity Assessment & Plan: Chronic. History of scoliosis and DJD.  Previously on Flexeril for intermittent flareups of low back pain.  No red flags. Trial baclofen 5 mg nightly.  Can increase to 10 mg if needed. Follow up ain 6 months  Orders: -     Baclofen; Take 1 tablet (5 mg total) by mouth at  bedtime as needed.  Dispense: 30 tablet; Refill: 0  Estrogen deficiency Assessment & Plan: Currently on Premarin and Prometrium  Prescribed by Dr. Littie Deeds, MD in [place]   Orders: -     DG Bone Density; Future  Breast cancer screening by mammogram -     3D Screening Mammogram, Left and Right; Future  HCM Referral sent for mammogram Referral sent for DEXA scan Declined shingles vaccine Recommend tetanus booster  PDMP reviewed  Return in about 6 months (around 08/01/2023) for PCP.  Dana Allan, MD

## 2023-02-04 ENCOUNTER — Telehealth: Payer: Self-pay | Admitting: Family Medicine

## 2023-02-04 ENCOUNTER — Other Ambulatory Visit: Payer: Self-pay

## 2023-02-04 DIAGNOSIS — I10 Essential (primary) hypertension: Secondary | ICD-10-CM

## 2023-02-04 MED ORDER — AMLODIPINE BESYLATE 5 MG PO TABS: 5.0000 mg | ORAL_TABLET | Freq: Every day | ORAL | 3 refills | Status: DC

## 2023-02-04 NOTE — Telephone Encounter (Signed)
Approved.  Lynn Powell,cma

## 2023-02-04 NOTE — Telephone Encounter (Signed)
Pt need a refill on am[facility]ODipine sent [facility] 

## 2023-02-07 ENCOUNTER — Encounter: Payer: Self-pay | Admitting: Family Medicine

## 2023-02-07 DIAGNOSIS — Z1231 Encounter for screening mammogram for malignant neoplasm of breast: Secondary | ICD-10-CM | POA: Insufficient documentation

## 2023-02-07 DIAGNOSIS — E2839 Other primary ovarian failure: Secondary | ICD-10-CM | POA: Insufficient documentation

## 2023-02-07 DIAGNOSIS — M545 Low back pain, unspecified: Secondary | ICD-10-CM | POA: Insufficient documentation

## 2023-02-07 NOTE — Assessment & Plan Note (Deleted)
Chronic.  Previously on Flexeril for intermittent flareups of low back pain.  No red flags. Trial baclofen 5 mg nightly.  Can increase to 10 mg if needed. Follow up ain 6 months

## 2023-02-07 NOTE — Assessment & Plan Note (Signed)
Currently on Premarin and Prometrium  Prescribed by Dr. Littie Deeds, MD in [place]

## 2023-02-07 NOTE — Assessment & Plan Note (Signed)
Chronic.  Well controlled on current medication.  -Continue Amlodipine 5 mg daily -Monitor blood pressure at home.  Goal less than 140/90 per JNC 8 guidelines for age -Follow up in 6 months

## 2023-02-07 NOTE — Assessment & Plan Note (Signed)
Chronic. History of scoliosis and DJD.  Previously on Flexeril for intermittent flareups of low back pain.  No red flags. Trial baclofen 5 mg nightly.  Can increase to 10 mg if needed. Follow up ain 6 months

## 2023-02-28 ENCOUNTER — Other Ambulatory Visit: Payer: Self-pay | Admitting: Family Medicine

## 2023-02-28 DIAGNOSIS — M545 Low back pain, unspecified: Secondary | ICD-10-CM

## 2023-03-03 MED ORDER — BACLOFEN 5 MG PO TABS: 5.0000 mg | ORAL_TABLET | Freq: Every evening | ORAL | 0 refills | Status: DC | PRN

## 2023-04-22 ENCOUNTER — Other Ambulatory Visit: Payer: Self-pay | Admitting: Family Medicine

## 2023-04-22 DIAGNOSIS — M545 Low back pain, unspecified: Secondary | ICD-10-CM

## 2023-05-15 ENCOUNTER — Other Ambulatory Visit: Payer: Self-pay | Admitting: Family Medicine

## 2023-05-15 DIAGNOSIS — M545 Low back pain, unspecified: Secondary | ICD-10-CM

## 2023-08-01 ENCOUNTER — Ambulatory Visit (INDEPENDENT_AMBULATORY_CARE_PROVIDER_SITE_OTHER): Payer: Medicare Other | Admitting: *Deleted

## 2023-08-01 VITALS — Ht 63.0 in | Wt 98.0 lb

## 2023-08-01 DIAGNOSIS — Z0000 Encounter for general adult medical examination without abnormal findings: Secondary | ICD-10-CM

## 2023-08-01 NOTE — Progress Notes (Signed)
Subjective:   Lynn Powell is a 82 y.o. female who presents for Medicare Annual (Subsequent) preventive examination.  Visit Complete: Virtual  I connected with  Carolynn Comment on 08/01/23 by a audio enabled telemedicine application and verified that I am speaking with the correct person using two identifiers.  Patient Location: Home  Provider Location: Home Office  I discussed the limitations of evaluation and management by telemedicine. The patient expressed understanding and agreed to proceed.  Because this visit was a virtual/telehealth visit, some criteria may be missing or patient reported. Any vitals not documented were not able to be obtained and vitals that have been documented are patient reported.    Cardiac Risk Factors include: advanced age (>78men, >83 women);dyslipidemia;hypertension     Objective:    Today's Vitals   08/01/23 1033  Weight: 98 lb (44.5 kg)  Height: 5\' 3"  (1.6 m)   Body mass index is 17.36 kg/m.     08/01/2023   10:51 AM 01/02/2023    5:13 PM 07/30/2022    8:42 AM 07/17/2021    9:26 AM 07/13/2020    2:42 PM 08/11/2019    4:08 PM 07/13/2019   12:21 PM  Advanced Directives  Does Patient Have a Medical Advance Directive? No No No No No No No  Would patient like information on creating a medical advance directive? No - Patient declined No - Patient declined No - Patient declined No - Patient declined No - Patient declined  No - Patient declined    Current Medications (verified) Outpatient Encounter Medications as of 08/01/2023  Medication Sig   amLODipine (NORVASC) 5 MG tablet Take 1 tablet (5 mg total) by mouth daily. In the am   Baclofen 5 MG TABS TAKE 1 TABLET BY MOUTH EVERY DAY AT BEDTIME AS NEEDED   progesterone (PROMETRIUM) 200 MG capsule TAKE ONE CAPSULE BY MOUTH DAILY AT BEDTIME   conjugated estrogens (PREMARIN) 25 MG injection Inject 25 mg into the vein. (Patient not taking: Reported on 08/01/2023)   No facility-administered encounter  medications on file as of 08/01/2023.    Allergies (verified) Patient has no known allergies.   History: Past Medical History:  Diagnosis Date   Anemia    kid   Cataract    Constipation    COPD (chronic obstructive pulmonary disease) (HCC)    Frequent headaches    Glaucoma    Glaucoma    Hyperlipidemia    Hypertension    Pneumonia    11/2017    Postnasal drip    Past Surgical History:  Procedure Laterality Date   ABDOMINAL HYSTERECTOMY     TONSILLECTOMY     Family History  Problem Relation Age of Onset   Heart disease Mother    Heart murmur Mother        originated from typhoid disease   Stroke Mother    Stroke Father    Social History   Socioeconomic History   Marital status: Widowed    Spouse name: Not on file   Number of children: Not on file   Years of education: Not on file   Highest education level: Associate degree: academic program  Occupational History   Not on file  Tobacco Use   Smoking status: Never   Smokeless tobacco: Never  Vaping Use   Vaping status: Never Used  Substance and Sexual Activity   Alcohol use: No   Drug use: No   Sexual activity: Never    Partners: Male  Other Topics Concern  Not on file  Social History Narrative   3 kids 54.46, 90 as of 09/05/21    Moved from [place] to be with daughter and grandchildren    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    Social Determinants of Health   Financial Resource Strain: Low Risk  (08/01/2023)   Overall Financial Resource Strain (CARDIA)    Difficulty of Paying Living Expenses: Not hard at all  Food Insecurity: No Food Insecurity (08/01/2023)   Hunger Vital Sign    Worried About Running Out of Food in the Last Year: Never true    Ran Out of Food in the Last Year: Never true  Transportation Needs: No Transportation Needs (08/01/2023)   PRAPARE - Administrator, Civil Service (Medical): No    Lack of Transportation (Non-Medical): No  Physical Activity: Inactive (08/01/2023)    Exercise Vital Sign    Days of Exercise per Week: 0 days    Minutes of Exercise per Session: 0 min  Stress: Stress Concern Present (08/01/2023)   [employer] of Occupational Health - Occupational Stress Questionnaire    Feeling of Stress : To some extent  Social Connections: Socially Isolated (08/01/2023)   Social Connection and Isolation Panel [NHANES]    Frequency of Communication with Friends and Family: More than three times a week    Frequency of Social Gatherings with Friends and Family: More than three times a week    Attends Religious Services: Never    Database administrator or Organizations: No    Attends Banker Meetings: Never    Marital Status: Widowed    Tobacco Counseling Counseling given: Not Answered   Clinical Intake:  Pre-visit preparation completed: Yes  Pain : No/denies pain     BMI - recorded: 17.36 Nutritional Risks: Unintentional weight loss (Has appointment with PCP 08/04/23) Diabetes: No  How often do you need to have someone help you when you read instructions, pamphlets, or other written materials from your doctor or pharmacy?: 1 - Never  Interpreter Needed?: No  Information entered by :: R. Brittny Spangle LPN   Activities of Daily Living    08/01/2023   10:36 AM  In your present state of health, do you have any difficulty performing the following activities:  Hearing? 0  Vision? 0  Comment glasses  Difficulty concentrating or making decisions? 0  Walking or climbing stairs? 0  Dressing or bathing? 0  Doing errands, shopping? 0  Preparing Food and eating ? N  Using the Toilet? N  In the past six months, have you accidently leaked urine? Y  Do you have problems with loss of bowel control? N  Managing your Medications? N  Managing your Finances? N  Housekeeping or managing your Housekeeping? N    Patient Care Team: Dana Allan, MD as PCP - General (Family Medicine)  Indicate any recent Medical Services you may have  received from other than [facility] providers in the past year (date may be approximate).     Assessment:   This is a routine wellness examination for [place].  Hearing/Vision screen Hearing Screening - Comments:: No issues Vision Screening - Comments:: glasses   Goals Addressed             This Visit's Progress    Patient Stated       Wants to exercise more and change diet       Depression Screen    08/01/2023   10:41 AM 01/30/2023    2:27  PM 07/30/2022    8:33 AM 07/02/2022    1:05 PM 12/18/2021    3:08 PM 07/17/2021    9:10 AM 07/13/2020    2:10 PM  PHQ 2/9 Scores  PHQ - 2 Score 0 0 0 0 0 0 0  PHQ- 9 Score 1          Fall Risk    08/01/2023   10:38 AM 01/30/2023    2:27 PM 07/30/2022    8:37 AM 07/02/2022    1:05 PM 12/18/2021    3:08 PM  Fall Risk   Falls in the past year? 0 0 0 0 0  Number falls in past yr: 0 0 0 0 0  Injury with Fall? 0 0 0 0 0  Risk for fall due to : No Fall Risks No Fall Risks  No Fall Risks No Fall Risks  Follow up Falls prevention discussed;Falls evaluation completed Falls evaluation completed Falls evaluation completed Falls evaluation completed Falls evaluation completed    MEDICARE RISK AT HOME: Medicare Risk at Home Any stairs in or around the home?: Yes If so, are there any without handrails?: No Home free of loose throw rugs in walkways, pet beds, electrical cords, etc?: Yes Adequate lighting in your home to reduce risk of falls?: Yes Life alert?: No Use of a cane, walker or w/c?: No Grab bars in the bathroom?: No Shower chair or bench in shower?: No Elevated toilet seat or a handicapped toilet?: No   Cognitive Function:    07/01/2018    8:19 AM  MMSE - Mini Mental State Exam  Orientation to time 5  Orientation to Place 5  Registration 3  Attention/ Calculation 5  Recall 3  Language- name 2 objects 2  Language- repeat 1  Language- follow 3 step command 3  Language- read & follow direction 1  Write a sentence 1  Copy design 1   Total score 30        08/01/2023   10:52 AM 07/30/2022    8:48 AM 07/13/2019   12:22 PM  6CIT Screen  What Year? 0 points 0 points 0 points  What month? 0 points 0 points 0 points  What time? 0 points 0 points 0 points  Count back from 20 0 points  0 points  Months in reverse 0 points  0 points  Repeat phrase 0 points  0 points  Total Score 0 points  0 points    Immunizations Immunization History  Administered Date(s) Administered   Pneumococcal Conjugate-13 02/02/2018   Pneumococcal Polysaccharide-23 09/14/2019    TDAP status: Due, Education has been provided regarding the importance of this vaccine. Advised may receive this vaccine at local pharmacy or Health Dept. Aware to provide a copy of the vaccination record if obtained from local pharmacy or Health Dept. Verbalized acceptance and understanding.  Flu Vaccine status: Declined, Education has been provided regarding the importance of this vaccine but patient still declined. Advised may receive this vaccine at local pharmacy or Health Dept. Aware to provide a copy of the vaccination record if obtained from local pharmacy or Health Dept. Verbalized acceptance and understanding.  Pneumococcal  vaccine status Up to date   Covid-19 vaccine status: Information provided on how to obtain vaccines.   Qualifies for Shingles Vaccine? Yes   Zostavax completed No   Shingrix Completed?: No.    Education has been provided regarding the importance of this vaccine. Patient has been advised to call insurance company to determine out  of pocket expense if they have not yet received this vaccine. Advised may also receive vaccine at local pharmacy or Health Dept. Verbalized acceptance and understanding.  Screening Tests Health Maintenance  Topic Date Due   DTaP/Tdap/Td (1 - Tdap) Never done   Zoster Vaccines- Shingrix (1 of 2) Never done   DEXA SCAN  Never done   INFLUENZA VACCINE  Never done   Medicare Annual Wellness (AWV)  07/31/2024    Pneumonia Vaccine 108+ Years old  Completed   HPV VACCINES  Aged Out   COVID-19 Vaccine  Discontinued    Health Maintenance  Health Maintenance Due  Topic Date Due   DTaP/Tdap/Td (1 - Tdap) Never done   Zoster Vaccines- Shingrix (1 of 2) Never done   DEXA SCAN  Never done   INFLUENZA VACCINE  Never done    Colorectal cancer screening: No longer required.   Mammogram status: No longer required due to age.  Bone Density status: Ordered 01/30/2023. Pt provided with contact info and advised to call to schedule appt.  Lung Cancer Screening: (Low Dose CT Chest recommended if Age 24-80 years, 20 pack-year currently smoking OR have quit w/in 15years.) does not qualify.   Additional Screening:  Hepatitis C Screening: does not qualify; Completed NA age  Vision Screening: Recommended annual ophthalmology exams for early detection of glaucoma and other disorders of the eye. Is the patient up to date with their annual eye exam?  Yes  Who is the provider or what is the name of the office in which the patient attends annual eye exams? [facility] If pt is not established with a provider, would they like to be referred to a provider to establish care? No .   Dental Screening: Recommended annual dental exams for proper oral hygiene    Community Resource Referral / Chronic Care Management: CRR required this visit?  No   CCM required this visit?  No     Plan:     I have personally reviewed and noted the following in the patient's chart:   Medical and social history Use of alcohol, tobacco or illicit drugs  Current medications and supplements including opioid prescriptions. Patient is not currently taking opioid prescriptions. Functional ability and status Nutritional status Physical activity Advanced directives List of other physicians Hospitalizations, surgeries, and ER visits in previous 12 months Vitals Screenings to include cognitive, depression, and falls Referrals and  appointments  In addition, I have reviewed and discussed with patient certain preventive protocols, quality metrics, and best practice recommendations. A written personalized care plan for preventive services as well as general preventive health recommendations were provided to patient.     Sydell Axon, LPN   07/05/1190   After Visit Summary: (MyChart) Due to this being a telephonic visit, the after visit summary with patients personalized plan was offered to patient via MyChart   Nurse Notes: None

## 2023-08-01 NOTE — Patient Instructions (Signed)
Ms. Ilagan , Thank you for taking time to come for your Medicare Wellness Visit. I appreciate your ongoing commitment to your health goals. Please review the following plan we discussed and let me know if I can assist you in the future.   Referrals/Orders/Follow-Ups/Clinician Recommendations: Remember to get your Bone Density done that was ordered 01/2023  This is a list of the screening recommended for you and due dates:  Health Maintenance  Topic Date Due   DTaP/Tdap/Td vaccine (1 - Tdap) Never done   Zoster (Shingles) Vaccine (1 of 2) Never done   DEXA scan (bone density measurement)  Never done   Flu Shot  Never done   Medicare Annual Wellness Visit  07/31/2024   Pneumonia Vaccine  Completed   HPV Vaccine  Aged Out   COVID-19 Vaccine  Discontinued    Advanced directives: (Declined) Advance directive discussed with you today. Even though you declined this today, please call our office should you change your mind, and we can give you the proper paperwork for you to fill out.  Next Medicare Annual Wellness Visit scheduled for next year: Yes 08/04/24 @ 10:30

## 2023-08-04 ENCOUNTER — Encounter: Payer: Self-pay | Admitting: Family Medicine

## 2023-08-04 ENCOUNTER — Ambulatory Visit: Payer: Medicare Other | Admitting: Family Medicine

## 2023-08-04 VITALS — BP 112/60 | HR 83 | Temp 98.0°F | Resp 16 | Ht 63.0 in | Wt 99.2 lb

## 2023-08-04 DIAGNOSIS — K581 Irritable bowel syndrome with constipation: Secondary | ICD-10-CM | POA: Diagnosis not present

## 2023-08-04 DIAGNOSIS — K5900 Constipation, unspecified: Secondary | ICD-10-CM

## 2023-08-04 DIAGNOSIS — I10 Essential (primary) hypertension: Secondary | ICD-10-CM

## 2023-08-04 NOTE — Progress Notes (Signed)
SUBJECTIVE:   Chief Complaint  Patient presents with   Hypertension   HPI Presents to clinic for HTN follow up   Discussed the use of AI scribe software for clinical note transcription with the patient, who gave verbal consent to proceed.  History of Present Illness The patient presents with a chief complaint of symptoms suggestive of Irritable Bowel Syndrome (IBS), which started around March of this year. They report experiencing bloating, gas, and constipation, which they believe is exacerbated by sugar intake. The patient has attempted dietary modifications, specifically reducing sugar and processed foods, which seems to have led to some improvement in symptoms. They also report a significant weight loss of approximately 10 pounds, which they attribute to these dietary changes.  In addition to the gastrointestinal symptoms, the patient also reports difficulty sleeping due to frequent awakenings. They have not been monitoring their blood pressure at home but report no symptoms of dizziness, chest pain, shortness of breath, or leg swelling. They are currently on amlodipine for blood pressure control.  The patient also mentions a history of breathing difficulties, which led to a hospital visit in March. However, these symptoms have since resolved with the use of a humidifier. They also report two recent visits to urgent care due to arthritis flare-ups, which were managed with steroids.  The patient has not seen a gastroenterologist for their gastrointestinal symptoms and has not had any recent antibiotic use. They deny any fever, bloody stools, or significant changes in appetite. They also deny any urinary symptoms or changes in bowel habits beyond the constipation. They have not had any recent imaging or diagnostic tests for their gastrointestinal symptoms.    PERTINENT PMH / PSH: As above  OBJECTIVE:  BP 112/60   Pulse 83   Temp 98 F (36.7 C)   Resp 16   Ht 5\' 3"  (1.6 m)   Wt 99  lb 4 oz (45 kg)   SpO2 100%   BMI 17.58 kg/m    Physical Exam Vitals reviewed.  Constitutional:      General: She is not in acute distress.    Appearance: Normal appearance. She is normal weight. She is not ill-appearing, toxic-appearing or diaphoretic.  Eyes:     General:        Right eye: No discharge.        Left eye: No discharge.     Conjunctiva/sclera: Conjunctivae normal.  Cardiovascular:     Rate and Rhythm: Normal rate and regular rhythm.     Heart sounds: Normal heart sounds.  Pulmonary:     Effort: Pulmonary effort is normal.     Breath sounds: Normal breath sounds.  Abdominal:     General: Bowel sounds are normal. There is no distension.     Palpations: There is no mass.     Tenderness: There is no abdominal tenderness.     Hernia: No hernia is present.  Musculoskeletal:        General: Normal range of motion.  Skin:    General: Skin is warm and dry.  Neurological:     General: No focal deficit present.     Mental Status: She is alert and oriented to person, place, and time. Mental status is at baseline.  Psychiatric:        Mood and Affect: Mood normal.        Behavior: Behavior normal.        Thought Content: Thought content normal.        Judgment:  Judgment normal.        08/04/2023    1:29 PM 08/01/2023   10:41 AM 01/30/2023    2:27 PM 07/30/2022    8:33 AM 07/02/2022    1:05 PM  Depression screen PHQ 2/9  Decreased Interest 0 0 0 0 0  Down, Depressed, Hopeless 0 0 0 0 0  PHQ - 2 Score 0 0 0 0 0  Altered sleeping 2 0     Tired, decreased energy 2 1     Change in appetite 0 0     Feeling bad or failure about yourself  0 0     Trouble concentrating 0 0     Moving slowly or fidgety/restless 0 0     Suicidal thoughts 0 0     PHQ-9 Score 4 1     Difficult doing work/chores Not difficult at all Not difficult at all         08/04/2023    1:29 PM 01/30/2023    2:27 PM  GAD 7 : Generalized Anxiety Score  Nervous, Anxious, on Edge 0 0  Control/stop  worrying 0 0  Worry too much - different things 0 0  Trouble relaxing 0 0  Restless 0 0  Easily annoyed or irritable 0 0  Afraid - awful might happen 0 0  Total GAD 7 Score 0 0  Anxiety Difficulty Not difficult at all Not difficult at all    ASSESSMENT/PLAN:  Essential hypertension Assessment & Plan: Well controlled on Amlodipine. No symptoms of dizziness, chest pain, shortness of breath, or leg swelling. -Continue Amlodipine. -Encourage patient to monitor blood pressure at home.  Orders: -     Comprehensive metabolic panel -     CBC  Irritable bowel syndrome with constipation Assessment & Plan: New onset of bloating, gas, and constipation since March 2024. Symptoms improved with dietary changes, specifically reducing sugar intake. No associated fever, abdominal pain, or blood in stool. -Continue dietary modifications, focusing on low FODMAP and soluble fiber diet. -Provide patient with educational materials on IBS. -Can trial Miralax daily -Can trial Metamucil daily -Order labs to check electrolytes, white blood cell count, and hemoglobin. -If symptoms worsen or persist, consider referral to a gastroenterologist.  Orders: -     Comprehensive metabolic panel -     CBC -     TSH   PDMP reviewed  Return if symptoms worsen or fail to improve, for PCP.  Dana Allan, MD

## 2023-08-04 NOTE — Patient Instructions (Addendum)
It was a pleasure meeting you today. Thank you for allowing me to take part in your health care.  Our goals for today as we discussed include:  Information provided for IBS If no improvement recommend GI referral  We will get some labs today.  If they are abnormal or we need to do something about them, I will call you.  If they are normal, I will send you a message on MyChart (if it is active) or a letter in the mail.  If you don't hear from [place] in 2 weeks, please call the office at the number below.    If you have any questions or concerns, please do not hesitate to call the office at [phone].  I look forward to our next visit and until then take care and stay safe.  Regards,   Dana Allan, MD   [facility]

## 2023-08-05 LAB — COMPREHENSIVE METABOLIC PANEL
ALT: 20 U/L (ref 0–35)
AST: 21 U/L (ref 0–37)
Albumin: 4.2 g/dL (ref 3.5–5.2)
Alkaline Phosphatase: 63 U/L (ref 39–117)
BUN: 18 mg/dL (ref 6–23)
CO2: 28 meq/L (ref 19–32)
Calcium: 9.9 mg/dL (ref 8.4–10.5)
Chloride: 104 meq/L (ref 96–112)
Creatinine, Ser: 0.86 mg/dL (ref 0.40–1.20)
GFR: 63.26 mL/min (ref >60.00)
Glucose, Bld: 92 mg/dL (ref 70–99)
Potassium: 4.1 meq/L (ref 3.5–5.1)
Sodium: 139 meq/L (ref 135–145)
Total Bilirubin: 0.5 mg/dL (ref 0.2–1.2)
Total Protein: 7.4 g/dL (ref 6.0–8.3)

## 2023-08-05 LAB — CBC
HCT: 38 % (ref 36.0–46.0)
Hemoglobin: 12.4 g/dL (ref 12.0–15.0)
MCHC: 32.6 g/dL (ref 30.0–36.0)
MCV: 98.8 fL (ref 78.0–100.0)
Platelets: 222 10*3/uL (ref 150.0–400.0)
RBC: 3.84 Mil/uL — ABNORMAL LOW (ref 3.87–5.11)
RDW: 14.7 % (ref 11.5–15.5)
WBC: 8.5 10*3/uL (ref 4.0–10.5)

## 2023-08-05 LAB — TSH: TSH: 0.51 u[IU]/mL (ref 0.35–5.50)

## 2023-08-07 ENCOUNTER — Encounter: Payer: Self-pay | Admitting: Family Medicine

## 2023-08-07 DIAGNOSIS — K5900 Constipation, unspecified: Secondary | ICD-10-CM

## 2023-08-09 ENCOUNTER — Encounter: Payer: Self-pay | Admitting: Family Medicine

## 2023-08-09 DIAGNOSIS — K589 Irritable bowel syndrome without diarrhea: Secondary | ICD-10-CM | POA: Insufficient documentation

## 2023-08-09 NOTE — Assessment & Plan Note (Signed)
New onset of bloating, gas, and constipation since March 2024. Symptoms improved with dietary changes, specifically reducing sugar intake. No associated fever, abdominal pain, or blood in stool. -Continue dietary modifications, focusing on low FODMAP and soluble fiber diet. -Provide patient with educational materials on IBS. -Can trial Miralax daily -Can trial Metamucil daily -Order labs to check electrolytes, white blood cell count, and hemoglobin. -If symptoms worsen or persist, consider referral to a gastroenterologist.

## 2023-08-09 NOTE — Assessment & Plan Note (Signed)
Well controlled on Amlodipine. No symptoms of dizziness, chest pain, shortness of breath, or leg swelling. -Continue Amlodipine. -Encourage patient to monitor blood pressure at home.

## 2023-08-11 NOTE — Telephone Encounter (Signed)
Pt agreeable to GI referral. Referral placed

## 2023-08-19 ENCOUNTER — Encounter: Payer: Self-pay | Admitting: Family Medicine

## 2023-08-22 ENCOUNTER — Ambulatory Visit: Payer: Medicare Other | Admitting: Gastroenterology

## 2023-09-04 ENCOUNTER — Ambulatory Visit: Payer: Medicare Other | Admitting: Family Medicine

## 2023-09-04 NOTE — Telephone Encounter (Signed)
Called pt and she did not want to go to [facility] and said she would wait until the 22nd to see Dr. Clent Ridges. I did advise if she got worse to go to the UC or ED, and not wait until the 22nd.

## 2023-09-07 ENCOUNTER — Encounter: Payer: Self-pay | Admitting: Family Medicine

## 2023-09-08 ENCOUNTER — Ambulatory Visit (INDEPENDENT_AMBULATORY_CARE_PROVIDER_SITE_OTHER): Payer: Medicare Other | Admitting: Family Medicine

## 2023-09-08 ENCOUNTER — Encounter: Payer: Self-pay | Admitting: Family Medicine

## 2023-09-08 VITALS — BP 122/60 | HR 90 | Temp 98.0°F | Resp 16 | Ht 63.0 in | Wt 101.1 lb

## 2023-09-08 DIAGNOSIS — R300 Dysuria: Secondary | ICD-10-CM | POA: Diagnosis not present

## 2023-09-08 DIAGNOSIS — K582 Mixed irritable bowel syndrome: Secondary | ICD-10-CM

## 2023-09-08 DIAGNOSIS — R140 Abdominal distension (gaseous): Secondary | ICD-10-CM

## 2023-09-08 DIAGNOSIS — R634 Abnormal weight loss: Secondary | ICD-10-CM | POA: Diagnosis not present

## 2023-09-08 DIAGNOSIS — R7309 Other abnormal glucose: Secondary | ICD-10-CM

## 2023-09-08 DIAGNOSIS — E2839 Other primary ovarian failure: Secondary | ICD-10-CM

## 2023-09-08 DIAGNOSIS — F5104 Psychophysiologic insomnia: Secondary | ICD-10-CM

## 2023-09-08 DIAGNOSIS — K5900 Constipation, unspecified: Secondary | ICD-10-CM

## 2023-09-08 NOTE — Patient Instructions (Addendum)
It was a pleasure meeting you today. Thank you for allowing me to take part in your health care.  Our goals for today as we discussed include:  CT abdomen has been ordered. They will call you with appointment   This is a list of the screening recommended for you and due dates:  Health Maintenance  Topic Date Due   DTaP/Tdap/Td vaccine (1 - Tdap) Never done   Zoster (Shingles) Vaccine (1 of 2) Never done   DEXA scan (bone density measurement)  Never done   Flu Shot  01/26/2024*   Medicare Annual Wellness Visit  07/31/2024   Pneumonia Vaccine  Completed   HPV Vaccine  Aged Out   COVID-19 Vaccine  Discontinued  *Topic was postponed. The date shown is not the original due date.     Follow up for blood work tomorrow   If you have any questions or concerns, please do not hesitate to call the office at [phone].  I look forward to our next visit and until then take care and stay safe.  Regards,   Dana Allan, MD   [facility]

## 2023-09-08 NOTE — Progress Notes (Unsigned)
SUBJECTIVE:   Chief Complaint  Patient presents with   GI Problem   HPI Presents to clinic for acute visit  Discussed the use of AI scribe software for clinical note transcription with the patient, who gave verbal consent to proceed.  History of Present Illness The patient, with a history of gastrointestinal issues, initially believed to be IBS, presents with a recurrence of symptoms. They report a period of improvement after a previous episode, leading to the cancellation of a GI specialist appointment. However, a few days prior to the current consultation, they experienced a resurgence of symptoms, which they attribute to excessive sugar consumption.  The patient describes a pattern of constipation, which was temporarily disrupted by diarrhea due to a supplement prescribed by their doctor in [place]. They have since reduced the intake of this supplement to avoid diarrhea, but continue to struggle with constipation. They also report a sensation of stinging during urination and a similar discomfort in the vaginal area during hygiene routines and the application of estrogen.  The patient also mentions a history of heart, liver, and thyroid issues, for which they are taking supplements. They report a significant weight loss over the past year, which they attribute to a change in diet and reduced intake of packaged foods. They also note that they have been experiencing poor sleep, often waking up in the middle of the night.  The patient's symptoms seem to be influenced by dietary choices, particularly the consumption of sugar, and stress. They have noticed an improvement in symptoms when they avoid sugar and manage stress effectively.    PERTINENT PMH / PSH: As above  OBJECTIVE:  BP 122/60   Pulse 90   Temp 98 F (36.7 C)   Resp 16   Ht 5\' 3"  (1.6 m)   Wt 101 lb 2 oz (45.9 kg)   SpO2 99%   BMI 17.91 kg/m    Physical Exam Vitals reviewed.  Constitutional:      General: She  is not in acute distress.    Appearance: Normal appearance. She is normal weight. She is not ill-appearing, toxic-appearing or diaphoretic.  Eyes:     General:        Right eye: No discharge.        Left eye: No discharge.     Conjunctiva/sclera: Conjunctivae normal.  Cardiovascular:     Rate and Rhythm: Normal rate and regular rhythm.     Heart sounds: Normal heart sounds.  Pulmonary:     Effort: Pulmonary effort is normal.     Breath sounds: Normal breath sounds.  Abdominal:     General: Bowel sounds are decreased.     Tenderness: There is abdominal tenderness. There is no right CVA tenderness, left CVA tenderness or rebound.     Hernia: A hernia is present.  Musculoskeletal:        General: Normal range of motion.  Skin:    General: Skin is warm and dry.  Neurological:     General: No focal deficit present.     Mental Status: She is alert and oriented to person, place, and time. Mental status is at baseline.  Psychiatric:        Mood and Affect: Mood normal.        Behavior: Behavior normal.        Thought Content: Thought content normal.        Judgment: Judgment normal.        09/08/2023    4:26 PM 08/04/2023  1:29 PM 08/01/2023   10:41 AM 01/30/2023    2:27 PM 07/30/2022    8:33 AM  Depression screen PHQ 2/9  Decreased Interest 0 0 0 0 0  Down, Depressed, Hopeless 0 0 0 0 0  PHQ - 2 Score 0 0 0 0 0  Altered sleeping 1 2 0    Tired, decreased energy 2 2 1     Change in appetite 0 0 0    Feeling bad or failure about yourself  0 0 0    Trouble concentrating 0 0 0    Moving slowly or fidgety/restless 0 0 0    Suicidal thoughts 0 0 0    PHQ-9 Score 3 4 1     Difficult doing work/chores Somewhat difficult Not difficult at all Not difficult at all        09/08/2023    4:26 PM 08/04/2023    1:29 PM 01/30/2023    2:27 PM  GAD 7 : Generalized Anxiety Score  Nervous, Anxious, on Edge 0 0 0  Control/stop worrying 0 0 0  Worry too much - different things 0 0 0  Trouble  relaxing 1 0 0  Restless 0 0 0  Easily annoyed or irritable 0 0 0  Afraid - awful might happen 0 0 0  Total GAD 7 Score 1 0 0  Anxiety Difficulty Not difficult at all Not difficult at all Not difficult at all    ASSESSMENT/PLAN:  Dysuria Assessment & Plan: Reports of stinging during urination. -Collect urine sample for urinalysis to rule out infection.  Orders: -     Urinalysis, Routine w reflex microscopic; Future  Weight loss Assessment & Plan: Reports of significant weight loss over the past year to year and a half. -Order CT scan of abdomen to investigate potential causes. -Consider dietary consultation to ensure adequate nutrition. -Check labs  Orders: -     CBC with Differential/Platelet; Future -     Comprehensive metabolic panel; Future -     TSH; Future -     Vitamin B12; Future -     CT ABDOMEN PELVIS W WO CONTRAST; Future  Irritable bowel syndrome with both constipation and diarrhea Assessment & Plan: Chronic constipation with intermittent diarrhea due to supplements. Abdominal discomfort and bloating. Possible dietary triggers identified (sugar). -Collect urine sample for analysis. -Order CT scan of abdomen to further investigate symptoms and weight loss. -Consider dietary modifications, specifically reducing sugar intake.  Orders: -     CT ABDOMEN PELVIS W WO CONTRAST; Future  Abdominal bloating Assessment & Plan: History of hernia, intermittent abdominal pain, constipation and diarrhea.   Mild abdominal discomfort on exam.   Recommend GI referral. Patient would like to hold off until after imaging. Check labs  Orders: -     CT ABDOMEN PELVIS W WO CONTRAST; Future  Abnormal glucose -     Hemoglobin A1c; Future  Constipation, unspecified constipation type Assessment & Plan: Chronic constipation with intermittent diarrhea due to supplements. Abdominal discomfort and bloating. Possible dietary triggers identified (sugar). -Collect urine sample for  analysis. -Order CT scan of abdomen to further investigate symptoms and weight loss. -Consider dietary modifications, specifically reducing sugar intake.   Estrogen deficiency Assessment & Plan: Ongoing vaginal estrogen therapy. -Continue current regimen.   Chronic insomnia Assessment & Plan: Reports of disrupted sleep, waking frequently during the night. -Continue current regimen of Baclofen at night for muscle relaxation and improved sleep quality.     PDMP reviewed  Return in  about 1 day (around 09/09/2023) for LAB .  Dana Allan, MD

## 2023-09-08 NOTE — Telephone Encounter (Signed)
Pt notified in different mychart msg

## 2023-09-09 ENCOUNTER — Telehealth: Payer: Self-pay | Admitting: Family Medicine

## 2023-09-09 ENCOUNTER — Encounter: Payer: Self-pay | Admitting: Family Medicine

## 2023-09-09 ENCOUNTER — Other Ambulatory Visit (INDEPENDENT_AMBULATORY_CARE_PROVIDER_SITE_OTHER): Payer: Medicare Other

## 2023-09-09 DIAGNOSIS — R7309 Other abnormal glucose: Secondary | ICD-10-CM

## 2023-09-09 DIAGNOSIS — R634 Abnormal weight loss: Secondary | ICD-10-CM

## 2023-09-09 DIAGNOSIS — R300 Dysuria: Secondary | ICD-10-CM | POA: Insufficient documentation

## 2023-09-09 DIAGNOSIS — F5104 Psychophysiologic insomnia: Secondary | ICD-10-CM | POA: Insufficient documentation

## 2023-09-09 DIAGNOSIS — R140 Abdominal distension (gaseous): Secondary | ICD-10-CM | POA: Insufficient documentation

## 2023-09-09 LAB — CBC WITH DIFFERENTIAL/PLATELET
Basophils Absolute: 0 10*3/uL (ref 0.0–0.1)
Basophils Relative: 1.1 % (ref 0.0–3.0)
Eosinophils Absolute: 0.1 10*3/uL (ref 0.0–0.7)
Eosinophils Relative: 1.3 % (ref 0.0–5.0)
HCT: 36.6 % (ref 36.0–46.0)
Hemoglobin: 12.4 g/dL (ref 12.0–15.0)
Lymphocytes Relative: 30.5 % (ref 12.0–46.0)
Lymphs Abs: 1.3 10*3/uL (ref 0.7–4.0)
MCHC: 33.7 g/dL (ref 30.0–36.0)
MCV: 99 fL (ref 78.0–100.0)
Monocytes Absolute: 0.5 10*3/uL (ref 0.1–1.0)
Monocytes Relative: 11.6 % (ref 3.0–12.0)
Neutro Abs: 2.4 10*3/uL (ref 1.4–7.7)
Neutrophils Relative %: 55.5 % (ref 43.0–77.0)
Platelets: 227 10*3/uL (ref 150.0–400.0)
RBC: 3.7 Mil/uL — ABNORMAL LOW (ref 3.87–5.11)
RDW: 14.4 % (ref 11.5–15.5)
WBC: 4.4 10*3/uL (ref 4.0–10.5)

## 2023-09-09 LAB — URINALYSIS, ROUTINE W REFLEX MICROSCOPIC
Bilirubin Urine: NEGATIVE
Hgb urine dipstick: NEGATIVE
Ketones, ur: NEGATIVE
Nitrite: NEGATIVE
RBC / HPF: NONE SEEN (ref 0–?)
Specific Gravity, Urine: 1.01 (ref 1.000–1.030)
Total Protein, Urine: NEGATIVE
Urine Glucose: NEGATIVE
Urobilinogen, UA: 0.2 (ref 0.0–1.0)
pH: 6.5 (ref 5.0–8.0)

## 2023-09-09 LAB — COMPREHENSIVE METABOLIC PANEL
ALT: 23 U/L (ref 0–35)
AST: 23 U/L (ref 0–37)
Albumin: 4.2 g/dL (ref 3.5–5.2)
Alkaline Phosphatase: 70 U/L (ref 39–117)
BUN: 16 mg/dL (ref 6–23)
CO2: 26 meq/L (ref 19–32)
Calcium: 9.4 mg/dL (ref 8.4–10.5)
Chloride: 102 meq/L (ref 96–112)
Creatinine, Ser: 0.86 mg/dL (ref 0.40–1.20)
GFR: 63.22 mL/min (ref >60.00)
Glucose, Bld: 98 mg/dL (ref 70–99)
Potassium: 4.1 meq/L (ref 3.5–5.1)
Sodium: 137 meq/L (ref 135–145)
Total Bilirubin: 0.5 mg/dL (ref 0.2–1.2)
Total Protein: 6.9 g/dL (ref 6.0–8.3)

## 2023-09-09 LAB — TSH: TSH: 0.74 u[IU]/mL (ref 0.35–5.50)

## 2023-09-09 LAB — VITAMIN B12: Vitamin B-12: 1521 pg/mL — ABNORMAL HIGH (ref 211–911)

## 2023-09-09 LAB — HEMOGLOBIN A1C: Hgb A1c MFr Bld: 5.4 % (ref 4.6–6.5)

## 2023-09-09 NOTE — Assessment & Plan Note (Signed)
Reports of stinging during urination. -Collect urine sample for urinalysis to rule out infection.

## 2023-09-09 NOTE — Assessment & Plan Note (Signed)
Ongoing vaginal estrogen therapy. -Continue current regimen.

## 2023-09-09 NOTE — Assessment & Plan Note (Signed)
Chronic constipation with intermittent diarrhea due to supplements. Abdominal discomfort and bloating. Possible dietary triggers identified (sugar). -Collect urine sample for analysis. -Order CT scan of abdomen to further investigate symptoms and weight loss. -Consider dietary modifications, specifically reducing sugar intake.

## 2023-09-09 NOTE — Assessment & Plan Note (Signed)
Reports of disrupted sleep, waking frequently during the night. -Continue current regimen of Baclofen at night for muscle relaxation and improved sleep quality.

## 2023-09-09 NOTE — Telephone Encounter (Signed)
Lft pt vm to call ofc to sch stat CT. thanks 

## 2023-09-09 NOTE — Assessment & Plan Note (Addendum)
History of hernia, intermittent abdominal pain, constipation and diarrhea.   Mild abdominal discomfort on exam.   Recommend GI referral. Patient would like to hold off until after imaging. Check labs

## 2023-09-09 NOTE — Assessment & Plan Note (Addendum)
Reports of significant weight loss over the past year to year and a half. -Order CT scan of abdomen to investigate potential causes. -Consider dietary consultation to ensure adequate nutrition. -Check labs

## 2023-09-10 ENCOUNTER — Encounter: Payer: Self-pay | Admitting: Family Medicine

## 2023-09-10 ENCOUNTER — Ambulatory Visit: Payer: Medicare Other

## 2023-09-10 ENCOUNTER — Ambulatory Visit
Admission: RE | Admit: 2023-09-10 | Discharge: 2023-09-10 | Disposition: A | Discharge status: Home / Self Care | Payer: Medicare Other | Source: Ambulatory Visit | Attending: Family Medicine | Admitting: Family Medicine

## 2023-09-10 DIAGNOSIS — R634 Abnormal weight loss: Secondary | ICD-10-CM | POA: Diagnosis present

## 2023-09-10 DIAGNOSIS — I700 Atherosclerosis of aorta: Secondary | ICD-10-CM | POA: Insufficient documentation

## 2023-09-10 DIAGNOSIS — R140 Abdominal distension (gaseous): Secondary | ICD-10-CM | POA: Insufficient documentation

## 2023-09-10 DIAGNOSIS — K582 Mixed irritable bowel syndrome: Secondary | ICD-10-CM | POA: Diagnosis present

## 2023-09-10 LAB — URINE CULTURE
MICRO NUMBER:: 15719010
Result:: NO GROWTH
SPECIMEN QUALITY:: ADEQUATE

## 2023-09-10 MED ORDER — IOHEXOL 300 MG/ML  SOLN
80.0000 mL | Freq: Once | INTRAMUSCULAR | Status: AC | PRN
  Administered 2023-09-10: 80 mL via INTRAVENOUS

## 2023-09-11 ENCOUNTER — Other Ambulatory Visit: Payer: Self-pay

## 2023-09-11 DIAGNOSIS — R140 Abdominal distension (gaseous): Secondary | ICD-10-CM

## 2023-09-11 DIAGNOSIS — K581 Irritable bowel syndrome with constipation: Secondary | ICD-10-CM

## 2023-09-11 DIAGNOSIS — K5900 Constipation, unspecified: Secondary | ICD-10-CM

## 2023-09-11 DIAGNOSIS — K582 Mixed irritable bowel syndrome: Secondary | ICD-10-CM

## 2023-09-12 ENCOUNTER — Encounter: Payer: Self-pay | Admitting: Family Medicine

## 2023-09-12 ENCOUNTER — Other Ambulatory Visit: Payer: Self-pay | Admitting: Family Medicine

## 2023-09-12 MED ORDER — LACTULOSE 10 GM/15ML PO SOLN: 30.0000 g | Freq: Two times a day (BID) | ORAL | 0 refills | Status: DC | PRN

## 2023-09-12 NOTE — Telephone Encounter (Signed)
Called pt and made her aware that rx was sent to pharmacy.

## 2023-09-19 ENCOUNTER — Ambulatory Visit: Payer: Medicare Other | Admitting: Family Medicine

## 2023-11-10 ENCOUNTER — Other Ambulatory Visit: Payer: Self-pay | Admitting: Family Medicine

## 2023-11-17 ENCOUNTER — Other Ambulatory Visit: Payer: Self-pay | Admitting: Family

## 2023-11-17 DIAGNOSIS — M545 Low back pain, unspecified: Secondary | ICD-10-CM

## 2023-12-01 ENCOUNTER — Other Ambulatory Visit: Payer: Self-pay

## 2023-12-01 DIAGNOSIS — Z79899 Other long term (current) drug therapy: Secondary | ICD-10-CM

## 2023-12-01 DIAGNOSIS — Z79890 Hormone replacement therapy (postmenopausal): Secondary | ICD-10-CM

## 2023-12-01 DIAGNOSIS — Z8701 Personal history of pneumonia (recurrent): Secondary | ICD-10-CM

## 2023-12-01 DIAGNOSIS — E876 Hypokalemia: Secondary | ICD-10-CM | POA: Diagnosis not present

## 2023-12-01 DIAGNOSIS — K529 Noninfective gastroenteritis and colitis, unspecified: Secondary | ICD-10-CM | POA: Diagnosis present

## 2023-12-01 DIAGNOSIS — K5731 Diverticulosis of large intestine without perforation or abscess with bleeding: Secondary | ICD-10-CM | POA: Diagnosis present

## 2023-12-01 DIAGNOSIS — A419 Sepsis, unspecified organism: Principal | ICD-10-CM | POA: Diagnosis present

## 2023-12-01 DIAGNOSIS — R739 Hyperglycemia, unspecified: Secondary | ICD-10-CM | POA: Diagnosis present

## 2023-12-01 DIAGNOSIS — I10 Essential (primary) hypertension: Secondary | ICD-10-CM | POA: Diagnosis present

## 2023-12-01 DIAGNOSIS — K5909 Other constipation: Secondary | ICD-10-CM | POA: Diagnosis present

## 2023-12-01 DIAGNOSIS — J449 Chronic obstructive pulmonary disease, unspecified: Secondary | ICD-10-CM | POA: Diagnosis present

## 2023-12-01 DIAGNOSIS — Z823 Family history of stroke: Secondary | ICD-10-CM

## 2023-12-01 DIAGNOSIS — K30 Functional dyspepsia: Secondary | ICD-10-CM | POA: Diagnosis not present

## 2023-12-01 DIAGNOSIS — Z90710 Acquired absence of both cervix and uterus: Secondary | ICD-10-CM

## 2023-12-01 DIAGNOSIS — R188 Other ascites: Secondary | ICD-10-CM | POA: Diagnosis present

## 2023-12-01 DIAGNOSIS — E785 Hyperlipidemia, unspecified: Secondary | ICD-10-CM | POA: Diagnosis present

## 2023-12-01 DIAGNOSIS — Z8249 Family history of ischemic heart disease and other diseases of the circulatory system: Secondary | ICD-10-CM

## 2023-12-01 DIAGNOSIS — H269 Unspecified cataract: Secondary | ICD-10-CM | POA: Diagnosis present

## 2023-12-01 DIAGNOSIS — Z1152 Encounter for screening for COVID-19: Secondary | ICD-10-CM

## 2023-12-01 DIAGNOSIS — Z2089 Contact with and (suspected) exposure to other communicable diseases: Secondary | ICD-10-CM | POA: Diagnosis present

## 2023-12-01 DIAGNOSIS — K649 Unspecified hemorrhoids: Secondary | ICD-10-CM | POA: Diagnosis present

## 2023-12-01 DIAGNOSIS — Z9089 Acquired absence of other organs: Secondary | ICD-10-CM

## 2023-12-01 DIAGNOSIS — H409 Unspecified glaucoma: Secondary | ICD-10-CM | POA: Diagnosis present

## 2023-12-01 LAB — CBC
HCT: 44.2 % (ref 36.0–46.0)
Hemoglobin: 14.7 g/dL (ref 12.0–15.0)
MCH: 31.9 pg (ref 26.0–34.0)
MCHC: 33.3 g/dL (ref 30.0–36.0)
MCV: 95.9 fL (ref 80.0–100.0)
Platelets: 242 10*3/uL (ref 150–400)
RBC: 4.61 MIL/uL (ref 3.87–5.11)
RDW: 14.1 % (ref 11.5–15.5)
WBC: 11.3 10*3/uL — ABNORMAL HIGH (ref 4.0–10.5)
nRBC: 0 % (ref 0.0–0.2)

## 2023-12-01 NOTE — ED Triage Notes (Signed)
Pt states this morning she developed n/v/d and since has started having blood in her stool. Pt is not on blood thinners. Pt reports some abd discomfort.

## 2023-12-02 ENCOUNTER — Inpatient Hospital Stay
Admission: EM | Admit: 2023-12-02 | Discharge: 2023-12-08 | DRG: 871 | Disposition: A | Discharge status: Home Health | Payer: Medicare Other | Attending: Internal Medicine | Admitting: Internal Medicine

## 2023-12-02 ENCOUNTER — Emergency Department: Payer: Medicare Other

## 2023-12-02 DIAGNOSIS — Z79899 Other long term (current) drug therapy: Secondary | ICD-10-CM | POA: Diagnosis not present

## 2023-12-02 DIAGNOSIS — R112 Nausea with vomiting, unspecified: Principal | ICD-10-CM

## 2023-12-02 DIAGNOSIS — Z823 Family history of stroke: Secondary | ICD-10-CM | POA: Diagnosis not present

## 2023-12-02 DIAGNOSIS — Z2089 Contact with and (suspected) exposure to other communicable diseases: Secondary | ICD-10-CM | POA: Diagnosis present

## 2023-12-02 DIAGNOSIS — E876 Hypokalemia: Secondary | ICD-10-CM | POA: Diagnosis not present

## 2023-12-02 DIAGNOSIS — K529 Noninfective gastroenteritis and colitis, unspecified: Secondary | ICD-10-CM | POA: Diagnosis present

## 2023-12-02 DIAGNOSIS — H269 Unspecified cataract: Secondary | ICD-10-CM | POA: Diagnosis present

## 2023-12-02 DIAGNOSIS — H409 Unspecified glaucoma: Secondary | ICD-10-CM | POA: Diagnosis present

## 2023-12-02 DIAGNOSIS — I10 Essential (primary) hypertension: Secondary | ICD-10-CM | POA: Diagnosis present

## 2023-12-02 DIAGNOSIS — Z8249 Family history of ischemic heart disease and other diseases of the circulatory system: Secondary | ICD-10-CM | POA: Diagnosis not present

## 2023-12-02 DIAGNOSIS — K625 Hemorrhage of anus and rectum: Secondary | ICD-10-CM

## 2023-12-02 DIAGNOSIS — A419 Sepsis, unspecified organism: Secondary | ICD-10-CM | POA: Diagnosis present

## 2023-12-02 DIAGNOSIS — R188 Other ascites: Secondary | ICD-10-CM | POA: Diagnosis present

## 2023-12-02 DIAGNOSIS — Z9089 Acquired absence of other organs: Secondary | ICD-10-CM | POA: Diagnosis not present

## 2023-12-02 DIAGNOSIS — K649 Unspecified hemorrhoids: Secondary | ICD-10-CM | POA: Diagnosis present

## 2023-12-02 DIAGNOSIS — K5731 Diverticulosis of large intestine without perforation or abscess with bleeding: Secondary | ICD-10-CM | POA: Diagnosis present

## 2023-12-02 DIAGNOSIS — K5909 Other constipation: Secondary | ICD-10-CM | POA: Diagnosis present

## 2023-12-02 DIAGNOSIS — E785 Hyperlipidemia, unspecified: Secondary | ICD-10-CM | POA: Diagnosis present

## 2023-12-02 DIAGNOSIS — Z90710 Acquired absence of both cervix and uterus: Secondary | ICD-10-CM | POA: Diagnosis not present

## 2023-12-02 DIAGNOSIS — J449 Chronic obstructive pulmonary disease, unspecified: Secondary | ICD-10-CM | POA: Diagnosis present

## 2023-12-02 DIAGNOSIS — Z8701 Personal history of pneumonia (recurrent): Secondary | ICD-10-CM | POA: Diagnosis not present

## 2023-12-02 DIAGNOSIS — Z1152 Encounter for screening for COVID-19: Secondary | ICD-10-CM | POA: Diagnosis not present

## 2023-12-02 DIAGNOSIS — R739 Hyperglycemia, unspecified: Secondary | ICD-10-CM | POA: Diagnosis present

## 2023-12-02 DIAGNOSIS — K30 Functional dyspepsia: Secondary | ICD-10-CM | POA: Diagnosis not present

## 2023-12-02 DIAGNOSIS — Z79890 Hormone replacement therapy (postmenopausal): Secondary | ICD-10-CM | POA: Diagnosis not present

## 2023-12-02 LAB — MAGNESIUM: Magnesium: 2.3 mg/dL (ref 1.7–2.4)

## 2023-12-02 LAB — URINALYSIS, ROUTINE W REFLEX MICROSCOPIC
Bilirubin Urine: NEGATIVE
Glucose, UA: NEGATIVE mg/dL
Hgb urine dipstick: NEGATIVE
Ketones, ur: NEGATIVE mg/dL
Leukocytes,Ua: NEGATIVE
Nitrite: NEGATIVE
Protein, ur: 30 mg/dL — AB
Specific Gravity, Urine: >1.046 — ABNORMAL HIGH (ref 1.005–1.030)
pH: 5 (ref 5.0–8.0)

## 2023-12-02 LAB — COMPREHENSIVE METABOLIC PANEL
ALT: 24 U/L (ref 0–44)
AST: 35 U/L (ref 15–41)
Albumin: 3.7 g/dL (ref 3.5–5.0)
Alkaline Phosphatase: 59 U/L (ref 38–126)
Anion gap: 18 — ABNORMAL HIGH (ref 5–15)
BUN: 28 mg/dL — ABNORMAL HIGH (ref 8–23)
CO2: 20 mmol/L — ABNORMAL LOW (ref 22–32)
Calcium: 8.7 mg/dL — ABNORMAL LOW (ref 8.9–10.3)
Chloride: 101 mmol/L (ref 98–111)
Creatinine, Ser: 1.02 mg/dL — ABNORMAL HIGH (ref 0.44–1.00)
GFR, Estimated: 55 mL/min — ABNORMAL LOW (ref >60)
Glucose, Bld: 225 mg/dL — ABNORMAL HIGH (ref 70–99)
Potassium: 3.8 mmol/L (ref 3.5–5.1)
Sodium: 139 mmol/L (ref 135–145)
Total Bilirubin: 1 mg/dL (ref 0.0–1.2)
Total Protein: 7 g/dL (ref 6.5–8.1)

## 2023-12-02 LAB — HEMOGLOBIN A1C
Hgb A1c MFr Bld: 5.3 % (ref 4.8–5.6)
Mean Plasma Glucose: 105.41 mg/dL

## 2023-12-02 LAB — LACTIC ACID, PLASMA
Lactic Acid, Venous: 2.2 mmol/L (ref 0.5–1.9)
Lactic Acid, Venous: 1.6 mmol/L (ref 0.5–1.9)

## 2023-12-02 LAB — RESP PANEL BY RT-PCR (RSV, FLU A&B, COVID)  RVPGX2
Influenza A by PCR: NEGATIVE
Influenza B by PCR: NEGATIVE
Resp Syncytial Virus by PCR: NEGATIVE
SARS Coronavirus 2 by RT PCR: NEGATIVE

## 2023-12-02 LAB — TYPE AND SCREEN
ABO/RH(D): A NEG
Antibody Screen: NEGATIVE

## 2023-12-02 LAB — PHOSPHORUS: Phosphorus: 3.6 mg/dL (ref 2.5–4.6)

## 2023-12-02 LAB — LIPASE, BLOOD: Lipase: 28 U/L (ref 11–51)

## 2023-12-02 MED ORDER — SODIUM CHLORIDE 0.9 % IV SOLN: INTRAVENOUS | Status: AC

## 2023-12-02 MED ORDER — SODIUM CHLORIDE 0.9% FLUSH: 3.0000 mL | INTRAVENOUS | Status: DC | PRN

## 2023-12-02 MED ORDER — SODIUM CHLORIDE 0.9 % IV SOLN
2.0000 g | INTRAVENOUS | Status: DC
  Administered 2023-12-03 – 2023-12-07 (×5): 2 g via INTRAVENOUS
  Filled 2023-12-02 (×6): qty 20

## 2023-12-02 MED ORDER — SODIUM CHLORIDE 0.9 % IV BOLUS
1000.0000 mL | Freq: Once | INTRAVENOUS | Status: AC
  Administered 2023-12-02: 1000 mL via INTRAVENOUS

## 2023-12-02 MED ORDER — SODIUM CHLORIDE 0.9% FLUSH
3.0000 mL | Freq: Two times a day (BID) | INTRAVENOUS | Status: DC
  Administered 2023-12-02 – 2023-12-07 (×8): 3 mL via INTRAVENOUS

## 2023-12-02 MED ORDER — OXYCODONE HCL 5 MG PO TABS
5.0000 mg | ORAL_TABLET | Freq: Four times a day (QID) | ORAL | Status: DC | PRN
  Administered 2023-12-03 – 2023-12-08 (×4): 5 mg via ORAL
  Filled 2023-12-02 (×4): qty 1

## 2023-12-02 MED ORDER — MORPHINE SULFATE (PF) 2 MG/ML IV SOLN
2.0000 mg | Freq: Once | INTRAVENOUS | Status: AC
  Administered 2023-12-02: 2 mg via INTRAVENOUS
  Filled 2023-12-02: qty 1

## 2023-12-02 MED ORDER — TRAZODONE HCL 50 MG PO TABS
50.0000 mg | ORAL_TABLET | Freq: Every evening | ORAL | Status: DC | PRN
  Administered 2023-12-02 – 2023-12-07 (×6): 50 mg via ORAL
  Filled 2023-12-02 (×6): qty 1

## 2023-12-02 MED ORDER — METOPROLOL TARTRATE 5 MG/5ML IV SOLN: 5.0000 mg | Freq: Four times a day (QID) | INTRAVENOUS | Status: DC | PRN

## 2023-12-02 MED ORDER — IOHEXOL 300 MG/ML  SOLN
100.0000 mL | Freq: Once | INTRAMUSCULAR | Status: AC | PRN
  Administered 2023-12-02: 100 mL via INTRAVENOUS

## 2023-12-02 MED ORDER — ONDANSETRON HCL 4 MG PO TABS: 4.0000 mg | ORAL_TABLET | Freq: Four times a day (QID) | ORAL | Status: DC | PRN

## 2023-12-02 MED ORDER — SODIUM CHLORIDE 0.9% FLUSH
3.0000 mL | Freq: Two times a day (BID) | INTRAVENOUS | Status: DC
  Administered 2023-12-02 – 2023-12-07 (×12): 3 mL via INTRAVENOUS

## 2023-12-02 MED ORDER — ACETAMINOPHEN 325 MG PO TABS
650.0000 mg | ORAL_TABLET | Freq: Four times a day (QID) | ORAL | Status: DC | PRN
  Administered 2023-12-07: 650 mg via ORAL
  Filled 2023-12-02: qty 2

## 2023-12-02 MED ORDER — PANTOPRAZOLE SODIUM 40 MG IV SOLR
80.0000 mg | Freq: Once | INTRAVENOUS | Status: AC
  Administered 2023-12-02: 80 mg via INTRAVENOUS
  Filled 2023-12-02: qty 20

## 2023-12-02 MED ORDER — ONDANSETRON HCL 4 MG/2ML IJ SOLN
4.0000 mg | Freq: Once | INTRAMUSCULAR | Status: AC
  Administered 2023-12-02: 4 mg via INTRAVENOUS
  Filled 2023-12-02: qty 2

## 2023-12-02 MED ORDER — METRONIDAZOLE 500 MG/100ML IV SOLN
500.0000 mg | Freq: Once | INTRAVENOUS | Status: AC
  Administered 2023-12-02: 500 mg via INTRAVENOUS
  Filled 2023-12-02: qty 100

## 2023-12-02 MED ORDER — ONDANSETRON HCL 4 MG/2ML IJ SOLN: 4.0000 mg | Freq: Four times a day (QID) | INTRAMUSCULAR | Status: DC | PRN

## 2023-12-02 MED ORDER — ACETAMINOPHEN 650 MG RE SUPP: 650.0000 mg | Freq: Four times a day (QID) | RECTAL | Status: DC | PRN

## 2023-12-02 MED ORDER — CEFTRIAXONE SODIUM 2 G IJ SOLR
2.0000 g | Freq: Once | INTRAMUSCULAR | Status: AC
  Administered 2023-12-02: 2 g via INTRAVENOUS
  Filled 2023-12-02: qty 20

## 2023-12-02 MED ORDER — SODIUM CHLORIDE 0.9 % IV SOLN: 250.0000 mL | INTRAVENOUS | Status: AC | PRN

## 2023-12-02 MED ORDER — METRONIDAZOLE 500 MG/100ML IV SOLN
500.0000 mg | Freq: Two times a day (BID) | INTRAVENOUS | Status: DC
Start: 2023-12-02 — End: 2023-12-08
  Administered 2023-12-02 – 2023-12-07 (×10): 500 mg via INTRAVENOUS
  Filled 2023-12-02 (×12): qty 100

## 2023-12-02 NOTE — Consult Note (Signed)
 CODE SEPSIS - PHARMACY COMMUNICATION  **Broad-spectrum antimicrobials should be administered within one hour of sepsis diagnosis**  Time Code Sepsis call or page was received: 0921  Antibiotics ordered: Ceftriaxone , Flagyl   Time of first antibiotic administration: 1000  Additional action taken by pharmacy: N/A  If necessary, name of provider/nurse contacted: N/A    Will M. Lenon, PharmD Clinical Pharmacist 12/02/2023 9:45 AM

## 2023-12-02 NOTE — ED Notes (Signed)
 Assumed patient care and received report from the previous nurse.

## 2023-12-02 NOTE — H&P (Signed)
 [facility] History and Physical   Patient: Lynn Powell FMW:[id number]   PCP: Hope Merle, MD DOB: [birth year]   DOA: 12/02/2023   DOS: 12/02/2023   DOS: the patient was seen and examined on 12/02/2023  Patient coming from: The patient is coming from Home  Chief Complaint: Nausea vomiting and diarrhea  HPI: Lynn Powell is a 83 y.o. female with Past medical history of HTN, HLD, COPD, glaucoma, constipation as reviewed from EMR, presented with acute onset of nausea vomiting and diarrhea.  As per patient she got exposed to her son who was sick, had gastroenteritis and after 2 days she developed symptoms.  Nausea vomiting and diarrhea started on Monday to 325.  After few episodes of diarrhea she started having blood in the stool, multiple episodes.  No blood in the vomiting.  Denies any fever, no chest pain or palpitations, no shortness of breath. On my exam in the ED patient denied any abdominal pain, nausea and vomiting resolved, had 1 loose stool, yellow color no bleeding in the ED.   ED Course: VS afebrile, HR 102, RR 16, BP 131/85, 98% on room air BMP blood glucose 225 elevated, bicarb 20, creatinine 1.02, calcium 8.7, anion gap 18, rest within normal range. Lactic acid 2.2--1.6 improved CBC WBC 11.3 Negative COVID, flu and RSV.  UA negative CT A/P: Ileitis and enteritis, ascites.  No any other significant findings   Review of Systems: as mentioned in the history of present illness.  All other systems reviewed and are negative.  Past Medical History:  Diagnosis Date   Anemia    kid   Cataract    Constipation    COPD (chronic obstructive pulmonary disease) (HCC)    Frequent headaches    Glaucoma    Glaucoma    Hyperlipidemia    Hypertension    Pneumonia    11/2017    Postnasal drip    Past Surgical History:  Procedure Laterality Date   ABDOMINAL HYSTERECTOMY     TONSILLECTOMY     Social History:  reports that she has never smoked. She has never used smokeless  tobacco. She reports that she does not drink alcohol and does not use drugs.  No Known Allergies   Family history reviewed and not pertinent Family History  Problem Relation Age of Onset   Heart disease Mother    Heart murmur Mother        originated from typhoid disease   Stroke Mother    Stroke Father      Prior to Admission medications   Medication Sig Start Date End Date Taking? Authorizing Provider  amLODipine  (NORVASC ) 5 MG tablet Take 1 tablet (5 mg total) by mouth daily. In the am 02/04/23   Hope Merle, MD  Baclofen  5 MG TABS TAKE 1 TABLET BY MOUTH EVERY DAY AT BEDTIME AS NEEDED 05/15/23   Webb, Padonda B, FNP  conjugated estrogens (PREMARIN) vaginal cream Place 1 applicator vaginally daily.    [provider]  lactulose  (CHRONULAC ) 10 GM/15ML solution TAKE 45 ML BY MOUTH 2 TIMES DAILY AS NEEDED FOR MILD CONSTIPATION. 11/10/23   Hope Merle, MD  progesterone (PROMETRIUM) 200 MG capsule TAKE ONE CAPSULE BY MOUTH DAILY AT BEDTIME 11/22/16   [provider]    Physical Exam: Vitals:   12/01/23 2330 12/02/23 0404 12/02/23 0734 12/02/23 0831  BP: 131/85 124/74 (!) 164/87   Pulse: 96 (!) 102 91   Resp: 16 16 18    Temp: 97.9 F (36.6 C) 98  F (36.7 C)  98.1 F (36.7 C)  TempSrc: Oral   Oral  SpO2: 98% 97% 98%   Weight: 52.2 kg     Height: 5' 3 (1.6 m)       General: alert and oriented to time, place, and person. Appear in mild distress, affect appropriate Eyes: PERRLA, Conjunctiva normal ENT: Oral Mucosa Clear, moist  Neck: no JVD, no Abnormal Mass Or lumps Cardiovascular: S1 and S2 Present, no Murmur, peripheral pulses symmetrical Respiratory: good respiratory effort, Bilateral Air entry equal and Decreased, no signs of accessory muscle use, Clear to Auscultation, no Crackles, no wheezes Abdomen: Bowel Sound present, Soft and no tenderness, no hernia Skin: no rashes  Extremities: no Pedal edema, no calf tenderness Neurologic: without any new focal  findings Gait not checked due to patient safety concerns  Data Reviewed: I have personally reviewed and interpreted labs, imaging as discussed below.  CBC: Recent Labs  Lab 12/01/23 2333  WBC 11.3*  HGB 14.7  HCT 44.2  MCV 95.9  PLT 242   Basic Metabolic Panel: Recent Labs  Lab 12/01/23 2333  NA 139  K 3.8  CL 101  CO2 20*  GLUCOSE 225*  BUN 28*  CREATININE 1.02*  CALCIUM 8.7*   GFR: Estimated Creatinine Clearance: 35.6 mL/min (A) (by C-G formula based on SCr of 1.02 mg/dL (H)). Liver Function Tests: Recent Labs  Lab 12/01/23 2333  AST 35  ALT 24  ALKPHOS 59  BILITOT 1.0  PROT 7.0  ALBUMIN 3.7   Recent Labs  Lab 12/01/23 2333  LIPASE 28   No results for input(s): AMMONIA in the last 168 hours. Coagulation Profile: No results for input(s): INR, PROTIME in the last 168 hours. Cardiac Enzymes: No results for input(s): CKTOTAL, CKMB, CKMBINDEX, TROPONINI in the last 168 hours. BNP (last 3 results) No results for input(s): PROBNP in the last 8760 hours. HbA1C: No results for input(s): HGBA1C in the last 72 hours. CBG: No results for input(s): GLUCAP in the last 168 hours. Lipid Profile: No results for input(s): CHOL, HDL, LDLCALC, TRIG, CHOLHDL, LDLDIRECT in the last 72 hours. Thyroid  Function Tests: No results for input(s): TSH, T4TOTAL, FREET4, T3FREE, THYROIDAB in the last 72 hours. Anemia Panel: No results for input(s): VITAMINB12, FOLATE, FERRITIN, TIBC, IRON, RETICCTPCT in the last 72 hours. Urine analysis:    Component Value Date/Time   COLORURINE YELLOW 09/09/2023 0943   APPEARANCEUR CLEAR 09/09/2023 0943   LABSPEC 1.010 09/09/2023 0943   PHURINE 6.5 09/09/2023 0943   GLUCOSEU NEGATIVE 09/09/2023 0943   HGBUR NEGATIVE 09/09/2023 0943   BILIRUBINUR NEGATIVE 09/09/2023 0943   KETONESUR NEGATIVE 09/09/2023 0943   PROTEINUR NEGATIVE 10/09/2021 0814   UROBILINOGEN 0.2 09/09/2023 0943    NITRITE NEGATIVE 09/09/2023 0943   LEUKOCYTESUR TRACE (A) 09/09/2023 0943    Radiological Exams on Admission: CT ABDOMEN PELVIS W CONTRAST Result Date: 12/02/2023 CLINICAL DATA:  Abdominal pain.  Blood in stool. EXAM: CT ABDOMEN AND PELVIS WITH CONTRAST TECHNIQUE: Multidetector CT imaging of the abdomen and pelvis was performed using the standard protocol following bolus administration of intravenous contrast. RADIATION DOSE REDUCTION: This exam was performed according to the departmental dose-optimization program which includes automated exposure control, adjustment of the mA and/or kV according to patient size and/or use of iterative reconstruction technique. CONTRAST:  OMNIPAQUE  IOHEXOL  300 MG/ML  SOLN COMPARISON:  CT scan abdomen and pelvis from 09/10/2023. FINDINGS: Lower chest: The lung bases are clear. No pleural effusion. The heart is normal in size.  No pericardial effusion. Hepatobiliary: The liver is normal in size. Non-cirrhotic configuration. These is diffuse hepatic steatosis. No suspicious mass. Note is made of a subcentimeter subcapsular hypoattenuating focus in the left hepatic lobe, segment 4B, which is too small to adequately characterize but appear similar to the prior study. No intrahepatic or extrahepatic bile duct dilation. No calcified gallstones. Normal gallbladder wall thickness. No pericholecystic inflammatory changes. Pancreas: Unremarkable. No pancreatic ductal dilatation or surrounding inflammatory changes. Spleen: Within normal limits. No focal lesion. Adrenals/Urinary Tract: Adrenal glands are unremarkable. No suspicious renal mass. No hydronephrosis. No renal or ureteric calculi. Unremarkable urinary bladder. Stomach/Bowel: No disproportionate dilation of the small or large bowel loops. However, there are multiple ileal loops in the lower abdomen/pelvis which exhibit diffuse circumferential wall thickening/fuzzy margins. There is associated prominence of vasa recta and  mesenteric edema. Findings favor ileitis/enteritis. Unremarkable appendix. There are multiple diverticula throughout the colon, without imaging signs of diverticulitis. Vascular/Lymphatic: There is moderate ascites throughout the abdomen and pelvis. No walled-off abscess or loculated collection. No pneumoperitoneum. No abdominal or pelvic lymphadenopathy, by size criteria. No aneurysmal dilation of the major abdominal arteries. There are mild peripheral atherosclerotic vascular calcifications of the aorta and its major branches. Reproductive: The uterus is surgically absent. No large adnexal mass. Other: The visualized soft tissues and abdominal wall are unremarkable. Musculoskeletal: No suspicious osseous lesions. There are moderate multilevel degenerative changes in the visualized spine. IMPRESSION: 1. There are findings compatible with ileitis/enteritis, as described above. 2. Moderate ascites.  No walled-off abscess or loculated collection. 3. Multiple other nonacute observations, as described above. Electronically Signed   By: Ree Molt M.D.   On: 12/02/2023 08:35    I reviewed all nursing notes, pharmacy notes, vitals, pertinent old records.  Assessment/Plan Principal Problem:   Enteritis   # Sepsis secondary to acute enteritis and ileitis  Unknown cause.  Sepsis criteria tachycardia, leukocytosis and enteritis on CT scan Lactic acid improved with IV fluid Continue IV fluid for hydration Monitor vital signs   # Acute enteritis and ileitis  Nausea vomiting resolved, no abdominal pain GI bleed resolved had 1 BM loose stools in the ED Follow GI pathogen and C. Difficile Continue empiric antibiotics ceftriaxone  and Flagyl  for now Started clear liquid diet Gradually advance diet as per tolerance  # Hyperglycemia, no known history of diabetes Check hemoglobin A1c Check BMP tomorrow a.m.  # HTN, HLD Home meds amlodipine , held for now due to low blood pressure Monitor BP and titrate  medications accordingly  # Constipation, patient was on lactulose  at home Presented with diarrhea, hold laxatives for now   Nutrition: Full liquid diet DVT Prophylaxis: SCD due to GI bleeding  Advance goals of care discussion: Full code   Consults: None  Family Communication: family was not present at bedside, at the time of interview.  Opportunity was given to ask question and all questions were answered satisfactorily.  Disposition: Admitted as inpatient, medical telemetry unit. Likely to be discharged home, in 2 to 3 days, when stable  I have discussed plan of care as described above with RN and patient/family.  Severity of Illness: The appropriate patient status for this patient is INPATIENT. Inpatient status is judged to be reasonable and necessary in order to provide the required intensity of service to ensure the patient's safety. The patient's presenting symptoms, physical exam findings, and initial radiographic and laboratory data in the context of their chronic comorbidities is felt to place them at high risk for further clinical  deterioration. Furthermore, it is not anticipated that the patient will be medically stable for discharge from the hospital within 2 midnights of admission.   * I certify that at the point of admission it is my clinical judgment that the patient will require inpatient hospital care spanning beyond 2 midnights from the point of admission due to high intensity of service, high risk for further deterioration and high frequency of surveillance required.*   Author: ELVAN SOR, MD [facility] 12/02/2023 12:40 PM   To reach On-call, see care teams to locate the attending and reach out to them via [email or web address]. If 7PM-7AM, please contact night-coverage If you still have difficulty reaching the attending provider, please page the [facility] (Director on Call) for [facility] on amion for assistance.

## 2023-12-02 NOTE — ED Provider Notes (Signed)
 [facility] Provider Note    Event Date/Time   First MD Initiated Contact with Patient 12/02/23 0710     (approximate)   History   Diarrhea   HPI  Lynn Powell is a 83 year old female presenting to the emergency department for evaluation of vomiting and diarrhea.  Recently around family members with similar symptoms.  Ongoing for a few days.  Last night, had onset of blood in her stool.  Does report some straining prior to this occurring.  Not on anticoagulation.  Has generalized abdominal pain.  No fevers.  No recent travel.  Estimates at least 4 episodes of bloody diarrhea with moderate amount of blood today.  No lightheadedness, syncope.  Has not been able to tolerate p.o. for a few days.     Physical Exam   Triage Vital Signs: ED Triage Vitals  Encounter Vitals Group     BP 12/01/23 2330 131/85     Systolic BP Percentile --      Diastolic BP Percentile --      Pulse Rate 12/01/23 2330 96     Resp 12/01/23 2330 16     Temp 12/01/23 2330 97.9 F (36.6 C)     Temp Source 12/01/23 2330 Oral     SpO2 12/01/23 2330 98 %     Weight 12/01/23 2330 115 lb (52.2 kg)     Height 12/01/23 2330 5' 3 (1.6 m)     Head Circumference --      Peak Flow --      Pain Score 12/01/23 2329 6     Pain Loc --      Pain Education --      Exclude from Growth Chart --     Most recent vital signs: Vitals:   12/02/23 0734 12/02/23 0831  BP: (!) 164/87   Pulse: 91   Resp: 18   Temp:  98.1 F (36.7 C)  SpO2: 98%      General: Awake, interactive  CV:  Regular rate, good peripheral perfusion.  Resp:  Unlabored respirations.  Abd:  Nondistended, soft, no significant tenderness to palpation, rectal exam with frank maroon-colored blood  Neuro:  Symmetric facial movement, fluid speech   ED Results / Procedures / Treatments   Labs (all labs ordered are listed, but only abnormal results are displayed) Labs Reviewed  COMPREHENSIVE METABOLIC PANEL - Abnormal;  Notable for the following components:      Result Value   CO2 20 (*)    Glucose, Bld 225 (*)    BUN 28 (*)    Creatinine, Ser 1.02 (*)    Calcium 8.7 (*)    GFR, Estimated 55 (*)    Anion gap 18 (*)    All other components within normal limits  CBC - Abnormal; Notable for the following components:   WBC 11.3 (*)    All other components within normal limits  URINALYSIS, ROUTINE W REFLEX MICROSCOPIC - Abnormal; Notable for the following components:   Color, Urine YELLOW (*)    APPearance HAZY (*)    Specific Gravity, Urine >1.046 (*)    Protein, ur 30 (*)    Bacteria, UA RARE (*)    All other components within normal limits  LACTIC ACID, PLASMA - Abnormal; Notable for the following components:   Lactic Acid, Venous 2.2 (*)    All other components within normal limits  RESP PANEL BY RT-PCR (RSV, FLU A&B, COVID)  RVPGX2  GASTROINTESTINAL PANEL BY PCR, STOOL (REPLACES  STOOL CULTURE)  CULTURE, BLOOD (ROUTINE X 2)  CULTURE, BLOOD (ROUTINE X 2)  C DIFFICILE QUICK SCREEN W PCR REFLEX    LIPASE, BLOOD  LACTIC ACID, PLASMA  MAGNESIUM  PHOSPHORUS  HEMOGLOBIN A1C  TYPE AND SCREEN     EKG EKG independently reviewed interpreted by myself (ER attending) demonstrates:    RADIOLOGY Imaging independently reviewed and interpreted by myself demonstrates:  CT with findings of ileitis/enteritis as well as moderate ascites without abscess or loculated collection.  Radiology does note diverticula without evidence of diverticulitis.  PROCEDURES:  Critical Care performed: Yes, see critical care procedure note(s)  CRITICAL CARE Performed by: Nilsa Dade   Total critical care time: 32 minutes  Critical care time was exclusive of separately billable procedures and treating other patients.  Critical care was necessary to treat or prevent imminent or life-threatening deterioration.  Critical care was time spent personally by me on the following activities: development of treatment plan with  patient and/or surrogate as well as nursing, discussions with consultants, evaluation of patient's response to treatment, examination of patient, obtaining history from patient or surrogate, ordering and performing treatments and interventions, ordering and review of laboratory studies, ordering and review of radiographic studies, pulse oximetry and re-evaluation of patient's condition.   Procedures   MEDICATIONS ORDERED IN ED: Medications  sodium chloride  flush (NS) 0.9 % injection 3 mL (3 mLs Intravenous Given 12/02/23 1245)  sodium chloride  flush (NS) 0.9 % injection 3 mL (3 mLs Intravenous Given 12/02/23 1245)  sodium chloride  flush (NS) 0.9 % injection 3 mL (has no administration in time range)  0.9 %  sodium chloride  infusion (has no administration in time range)  0.9 %  sodium chloride  infusion ( Intravenous New Bag/Given 12/02/23 1259)  acetaminophen  (TYLENOL ) tablet 650 mg (has no administration in time range)    Or  acetaminophen  (TYLENOL ) suppository 650 mg (has no administration in time range)  ondansetron  (ZOFRAN ) tablet 4 mg (has no administration in time range)    Or  ondansetron  (ZOFRAN ) injection 4 mg (has no administration in time range)  metoprolol  tartrate (LOPRESSOR ) injection 5 mg (has no administration in time range)  oxyCODONE  (Oxy IR/ROXICODONE ) immediate release tablet 5 mg (has no administration in time range)  cefTRIAXone  (ROCEPHIN ) 2 g in sodium chloride  0.9 % 100 mL IVPB (has no administration in time range)  metroNIDAZOLE  (FLAGYL ) IVPB 500 mg (has no administration in time range)  iohexol  (OMNIPAQUE ) 300 MG/ML solution 100 mL (100 mLs Intravenous Contrast Given 12/02/23 0749)  ondansetron  (ZOFRAN ) injection 4 mg (4 mg Intravenous Given 12/02/23 0733)  sodium chloride  0.9 % bolus 1,000 mL (0 mLs Intravenous Stopped 12/02/23 1000)  morphine  (PF) 2 MG/ML injection 2 mg (2 mg Intravenous Given 12/02/23 0733)  cefTRIAXone  (ROCEPHIN ) 2 g in sodium chloride  0.9 % 100 mL IVPB (0 g  Intravenous Stopped 12/02/23 1031)  metroNIDAZOLE  (FLAGYL ) IVPB 500 mg (0 mg Intravenous Stopped 12/02/23 1213)  pantoprazole  (PROTONIX ) injection 80 mg (80 mg Intravenous Given 12/02/23 1000)     IMPRESSION / MDM / ASSESSMENT AND PLAN / ED COURSE  I reviewed the triage vital signs and the nursing notes.  Differential diagnosis includes, but is not limited to, viral GI illness, bacterial infection, lower GI bleed due to anorectal disease, consideration for diverticular bleed, other lower GI bleeding source, colitis, other acute intra-abdominal process  Patient's presentation is most consistent with acute presentation with potential threat to life or bodily function.  83 year old female presenting with vital, abdominal pain, and  diarrhea.  Borderline tachycardia on presentation.  Labs with mild leukocytosis WBC 11.3.  CMP with mild AKI with creatinine of 1.02.  Viral panel negative.  Will treat symptomatically with IV fluids, morphine , Zofran .  CT ordered to further evaluate.  CT demonstrates findings of enteritis/ileitis.  On reevaluation, patient does report improvement in her symptoms, but does have ongoing tachycardia with heart rate in the low 100s.  With her rectal bleeding, ileitis, did discuss admission and patient is agreeable with this plan.  With concerns for infectious diarrhea, will treat with empiric antibiotics.  Stool culture pending.  Does meet sepsis criteria.  Ordered for empiric Rocephin  and Flagyl .  Will reach out to hospitalist team to discuss admission.  Case discussed with hospitalist team.  They will evaluate the patient for anticipated admission.      FINAL CLINICAL IMPRESSION(S) / ED DIAGNOSES   Final diagnoses:  Nausea vomiting and diarrhea  Rectal bleeding  Ileitis     Rx / DC Orders   ED Discharge Orders     None        Note:  This document was prepared using Dragon voice recognition software and may include unintentional dictation errors.   Levander Slate, MD 12/02/23 [phone]

## 2023-12-02 NOTE — Sepsis Progress Note (Signed)
 Sepsis protocol monitored by eLink ?

## 2023-12-02 NOTE — ED Notes (Signed)
Report off to erika rn cpod nurse.  Pt moved to 32 h

## 2023-12-03 DIAGNOSIS — K529 Noninfective gastroenteritis and colitis, unspecified: Secondary | ICD-10-CM | POA: Diagnosis not present

## 2023-12-03 LAB — MAGNESIUM: Magnesium: 2.1 mg/dL (ref 1.7–2.4)

## 2023-12-03 LAB — CBC
HCT: 31.9 % — ABNORMAL LOW (ref 36.0–46.0)
Hemoglobin: 10.7 g/dL — ABNORMAL LOW (ref 12.0–15.0)
MCH: 30.7 pg (ref 26.0–34.0)
MCHC: 33.5 g/dL (ref 30.0–36.0)
MCV: 91.7 fL (ref 80.0–100.0)
Platelets: 159 10*3/uL (ref 150–400)
RBC: 3.48 MIL/uL — ABNORMAL LOW (ref 3.87–5.11)
RDW: 14.6 % (ref 11.5–15.5)
WBC: 9.4 10*3/uL (ref 4.0–10.5)
nRBC: 0 % (ref 0.0–0.2)

## 2023-12-03 LAB — BASIC METABOLIC PANEL
Anion gap: 6 (ref 5–15)
BUN: 31 mg/dL — ABNORMAL HIGH (ref 8–23)
CO2: 22 mmol/L (ref 22–32)
Calcium: 8 mg/dL — ABNORMAL LOW (ref 8.9–10.3)
Chloride: 108 mmol/L (ref 98–111)
Creatinine, Ser: 0.72 mg/dL (ref 0.44–1.00)
GFR, Estimated: >60 mL/min (ref >60)
Glucose, Bld: 104 mg/dL — ABNORMAL HIGH (ref 70–99)
Potassium: 4.1 mmol/L (ref 3.5–5.1)
Sodium: 136 mmol/L (ref 135–145)

## 2023-12-03 LAB — PHOSPHORUS: Phosphorus: 2.7 mg/dL (ref 2.5–4.6)

## 2023-12-03 NOTE — ED Notes (Signed)
 Oral care supplies given to Pt.

## 2023-12-03 NOTE — ED Notes (Signed)
 Dietary called b/c pt never got tray, on full liquid diet.

## 2023-12-03 NOTE — Progress Notes (Signed)
 [facility] Progress Note  Patient: Lynn Powell    FMW:[id number]  DOA: 12/02/2023     Date of Service: the patient was seen and examined on 12/03/2023  Chief Complaint  Patient presents with   Diarrhea   Brief hospital course: [name] is a 83 y.o. female with Past medical history of HTN, HLD, COPD, glaucoma, constipation as reviewed from EMR, presented with acute onset of nausea vomiting and diarrhea.  As per patient she got exposed to her son who was sick, had gastroenteritis and after 2 days she developed symptoms.  Nausea vomiting and diarrhea started on Monday to 325.  After few episodes of diarrhea she started having blood in the stool, multiple episodes.  No blood in the vomiting.  Denies any fever, no chest pain or palpitations, no shortness of breath. On my exam in the ED patient denied any abdominal pain, nausea and vomiting resolved, had 1 loose stool, yellow color no bleeding in the ED.     ED Course: VS afebrile, HR 102, RR 16, BP 131/85, 98% on room air BMP blood glucose 225 elevated, bicarb 20, creatinine 1.02, calcium 8.7, anion gap 18, rest within normal range. Lactic acid 2.2--1.6 improved CBC WBC 11.3 Negative COVID, flu and RSV.  UA negative CT A/P: Ileitis and enteritis, ascites.  No any other significant findings   Assessment and Plan:  # Sepsis secondary to acute enteritis and ileitis  Unknown cause.  Sepsis criteria tachycardia, leukocytosis and enteritis on CT scan Lactic acid improved with IV fluid Continue IV fluid for hydration Monitor vital signs     # Acute enteritis and ileitis  Nausea vomiting resolved, no abdominal pain GI bleed resolved had 1 BM loose stools in the ED Follow GI pathogen and C. Difficile Continue empiric antibiotics ceftriaxone  and Flagyl  2/5 denies any nausea vomiting or diarrhea, tolerated diet well S/p FLD, started soft diet today   # Hyperglycemia, no known history of diabetes hemoglobin A1c 5.3 WNL 2/5 BG  104 improved   # HTN, HLD Home meds amlodipine , held for now due to low blood pressure Monitor BP and titrate medications accordingly   # Constipation, patient was on lactulose  at home Presented with diarrhea, hold laxatives for now    Body mass index is 20.37 kg/m.  Interventions:  Diet: Soft diet DVT Prophylaxis: SCD  Advance goals of care discussion: Full code  Family Communication: family was not present at bedside, at the time of interview.  The pt provided permission to discuss medical plan with the family. Opportunity was given to ask question and all questions were answered satisfactorily.   Disposition:  Pt is from Home, admitted with enteritis and ileitis, diarrhea with bleeding, gradually improving, diet advanced today, which precludes a safe discharge. Discharge to home, when will, most likely tomorrow a.m.  Subjective: No significant events overnight, patient could not sleep well due to being in the hallway in the ED, did not get room in the hospital. Patient is tolerating full liquid diet well, denies any nausea vomiting or diarrhea. Yesterday she had 4 episodes of diarrhea. Patient is willing to advance diet today and possible discharge tomorrow a.m. if remains stable.   Physical Exam: General: NAD, lying comfortably Appear in no distress, affect appropriate Eyes: PERRLA ENT: Oral Mucosa Clear, moist  Neck: no JVD,  Cardiovascular: S1 and S2 Present, no Murmur,  Respiratory: good respiratory effort, Bilateral Air entry equal and Decreased, no Crackles, no wheezes Abdomen: Bowel Sound present, Soft and no tenderness,  Skin: no rashes Extremities: no Pedal edema, no calf tenderness Neurologic: without any new focal findings Gait not checked due to patient safety concerns  Vitals:   12/03/23 0200 12/03/23 0330 12/03/23 0700 12/03/23 0800  BP: (!) 126/52 (!) 117/51 (!) 125/58 127/60  Pulse: 84 84 93 91  Resp: 18 15 18  (!) 24  Temp:      TempSrc:       SpO2: 99% 100% 98% 99%  Weight:      Height:        Intake/Output Summary (Last 24 hours) at 12/03/2023 1500 Last data filed at 12/03/2023 1047 Gross per 24 hour  Intake 1285.4 ml  Output --  Net 1285.4 ml   Filed Weights   12/01/23 2330  Weight: 52.2 kg    Data Reviewed: I have personally reviewed and interpreted daily labs, tele strips, imagings as discussed above. I reviewed all nursing notes, pharmacy notes, vitals, pertinent old records I have discussed plan of care as described above with RN and patient/family.  CBC: Recent Labs  Lab 12/01/23 2333 12/03/23 0656  WBC 11.3* 9.4  HGB 14.7 10.7*  HCT 44.2 31.9*  MCV 95.9 91.7  PLT 242 159   Basic Metabolic Panel: Recent Labs  Lab 12/01/23 2333 12/02/23 1343 12/03/23 0656  NA 139  --  136  K 3.8  --  4.1  CL 101  --  108  CO2 20*  --  22  GLUCOSE 225*  --  104*  BUN 28*  --  31*  CREATININE 1.02*  --  0.72  CALCIUM 8.7*  --  8.0*  MG  --  2.3 2.1  PHOS  --  3.6 2.7    Studies: No results found.  Scheduled Meds:  sodium chloride  flush  3 mL Intravenous Q12H   sodium chloride  flush  3 mL Intravenous Q12H   Continuous Infusions:  cefTRIAXone  (ROCEPHIN )  IV Stopped (12/03/23 1016)   metronidazole  Stopped (12/03/23 1047)   PRN Meds: acetaminophen  **OR** acetaminophen , metoprolol  tartrate, ondansetron  **OR** ondansetron  (ZOFRAN ) IV, oxyCODONE , sodium chloride  flush, traZODone   Time spent: 35 minutes  Author: ELVAN SOR. MD [facility] 12/03/2023 3:00 PM  To reach On-call, see care teams to locate the attending and reach out to them via [email or web address]. If 7PM-7AM, please contact night-coverage If you still have difficulty reaching the attending provider, please page the [facility] (Director on Call) for [facility] on amion for assistance.

## 2023-12-03 NOTE — ED Notes (Signed)
 Report to jess, rn

## 2023-12-03 NOTE — ED Notes (Signed)
 Son given update with Pt consent.

## 2023-12-03 NOTE — ED Notes (Signed)
 Pt provided with ear plugs for noise in hallway.

## 2023-12-03 NOTE — ED Notes (Signed)
 Patient is resting comfortably.

## 2023-12-04 ENCOUNTER — Other Ambulatory Visit: Payer: Self-pay

## 2023-12-04 DIAGNOSIS — K625 Hemorrhage of anus and rectum: Secondary | ICD-10-CM

## 2023-12-04 DIAGNOSIS — K529 Noninfective gastroenteritis and colitis, unspecified: Secondary | ICD-10-CM | POA: Diagnosis not present

## 2023-12-04 LAB — CBC
HCT: 30.6 % — ABNORMAL LOW (ref 36.0–46.0)
Hemoglobin: 10.5 g/dL — ABNORMAL LOW (ref 12.0–15.0)
MCH: 31.6 pg (ref 26.0–34.0)
MCHC: 34.3 g/dL (ref 30.0–36.0)
MCV: 92.2 fL (ref 80.0–100.0)
Platelets: 170 10*3/uL (ref 150–400)
RBC: 3.32 MIL/uL — ABNORMAL LOW (ref 3.87–5.11)
RDW: 14.5 % (ref 11.5–15.5)
WBC: 9.6 10*3/uL (ref 4.0–10.5)
nRBC: 0 % (ref 0.0–0.2)

## 2023-12-04 LAB — MAGNESIUM: Magnesium: 2.1 mg/dL (ref 1.7–2.4)

## 2023-12-04 LAB — BASIC METABOLIC PANEL
Anion gap: 7 (ref 5–15)
BUN: 20 mg/dL (ref 8–23)
CO2: 23 mmol/L (ref 22–32)
Calcium: 8.2 mg/dL — ABNORMAL LOW (ref 8.9–10.3)
Chloride: 108 mmol/L (ref 98–111)
Creatinine, Ser: 0.57 mg/dL (ref 0.44–1.00)
GFR, Estimated: >60 mL/min (ref >60)
Glucose, Bld: 82 mg/dL (ref 70–99)
Potassium: 3.8 mmol/L (ref 3.5–5.1)
Sodium: 138 mmol/L (ref 135–145)

## 2023-12-04 LAB — HEMOGLOBIN AND HEMATOCRIT, BLOOD
HCT: 32.6 % — ABNORMAL LOW (ref 36.0–46.0)
Hemoglobin: 10.8 g/dL — ABNORMAL LOW (ref 12.0–15.0)

## 2023-12-04 LAB — PHOSPHORUS: Phosphorus: 2.4 mg/dL — ABNORMAL LOW (ref 2.5–4.6)

## 2023-12-04 MED ORDER — K PHOS MONO-SOD PHOS DI & MONO 155-852-130 MG PO TABS
500.0000 mg | ORAL_TABLET | Freq: Once | ORAL | Status: AC
  Administered 2023-12-04: 500 mg via ORAL
  Filled 2023-12-04: qty 2

## 2023-12-04 MED ORDER — BISACODYL 5 MG PO TBEC
10.0000 mg | DELAYED_RELEASE_TABLET | Freq: Every day | ORAL | 2 refills | Status: DC
  Filled 2023-12-04: qty 100, 50d supply, fill #0

## 2023-12-04 MED ORDER — HYDROCORTISONE (PERIANAL) 2.5 % EX CREA
TOPICAL_CREAM | Freq: Two times a day (BID) | CUTANEOUS | 0 refills | Status: AC | PRN
  Filled 2023-12-04: qty 28, 10d supply, fill #0

## 2023-12-04 MED ORDER — PANTOPRAZOLE SODIUM 40 MG IV SOLR
40.0000 mg | Freq: Two times a day (BID) | INTRAVENOUS | Status: DC
  Administered 2023-12-04 – 2023-12-07 (×8): 40 mg via INTRAVENOUS
  Filled 2023-12-04 (×8): qty 10

## 2023-12-04 MED ORDER — PANTOPRAZOLE SODIUM 40 MG PO TBEC
40.0000 mg | DELAYED_RELEASE_TABLET | Freq: Every day | ORAL | 0 refills | Status: DC
  Filled 2023-12-04: qty 30, 30d supply, fill #0

## 2023-12-04 MED ORDER — HYDROCORTISONE ACETATE 25 MG RE SUPP
25.0000 mg | Freq: Two times a day (BID) | RECTAL | Status: AC
  Administered 2023-12-04 – 2023-12-05 (×4): 25 mg via RECTAL
  Filled 2023-12-04 (×4): qty 1

## 2023-12-04 MED ORDER — TRAZODONE HCL 50 MG PO TABS
25.0000 mg | ORAL_TABLET | Freq: Every evening | ORAL | 0 refills | Status: DC | PRN
  Filled 2023-12-04: qty 15, 30d supply, fill #0

## 2023-12-04 MED ORDER — AMLODIPINE BESYLATE 5 MG PO TABS
5.0000 mg | ORAL_TABLET | Freq: Every day | ORAL | Status: DC
  Administered 2023-12-04 – 2023-12-08 (×4): 5 mg via ORAL
  Filled 2023-12-04 (×4): qty 1

## 2023-12-04 MED ORDER — AMOXICILLIN-POT CLAVULANATE 500-125 MG PO TABS
1.0000 | ORAL_TABLET | Freq: Two times a day (BID) | ORAL | 0 refills | Status: DC
  Filled 2023-12-04: qty 10, 5d supply, fill #0

## 2023-12-04 MED ORDER — METRONIDAZOLE 500 MG PO TABS
500.0000 mg | ORAL_TABLET | Freq: Two times a day (BID) | ORAL | 0 refills | Status: DC
  Filled 2023-12-04: qty 10, 5d supply, fill #0

## 2023-12-04 MED ORDER — HYDROCORTISONE ACETATE 25 MG RE SUPP: 25.0000 mg | Freq: Two times a day (BID) | RECTAL | Status: AC | PRN

## 2023-12-04 MED ORDER — POLYETHYLENE GLYCOL 3350 17 GM/SCOOP PO POWD
1.0000 | Freq: Once | ORAL | Status: AC
  Administered 2023-12-04: 255 g via ORAL
  Filled 2023-12-04: qty 255

## 2023-12-04 MED ORDER — PHENYLEPHRINE IN HARD FAT 0.25 % RE SUPP
1.0000 | Freq: Once | RECTAL | Status: DC
  Filled 2023-12-04 (×2): qty 1

## 2023-12-04 MED ORDER — POLYETHYLENE GLYCOL 3350 17 GM/SCOOP PO POWD
17.0000 g | Freq: Two times a day (BID) | ORAL | 2 refills | Status: DC
  Filled 2023-12-04: qty 510, 15d supply, fill #0

## 2023-12-04 NOTE — Plan of Care (Signed)

## 2023-12-04 NOTE — Progress Notes (Signed)
 Pt c/o rectal bleeding at time of discharge. Discharge home cancelled. Pt given IV protonix . Med delay from pharmacy, received hydrocortisone  suppository but did not want 2 suppositories at once. Phenylephrine  suppository retimed due to patient preference. Pt hemoglobin 10.8. States her rectal bleeding has slowed down. No bowel movement noted this shift. No rectal bleeding noted with suppository administration. Pt given miralax  bowel prep, instructed to drink through the evening and report bowel movements/further bleeding.

## 2023-12-04 NOTE — Consult Note (Signed)
 Rogelia Copping, MD [facility]  [address], [address] [place], [place] [address] Phone: [phone] Fax : [phone]  Consultation  Referring Provider:     Dr. Von Primary Care Physician:  Hope Merle, MD Primary Gastroenterologist: Sampson         Reason for Consultation:     Rectal bleeding  Date of Admission:  12/02/2023 Date of Consultation:  12/04/2023         HPI:   Lynn Powell is a 83 y.o. female who has a history of hypertension COPD the, glaucoma and constipation who came in with acute nausea vomiting and diarrhea suggestive of gastroenteritis.  The patient also reports that when she first came in she was having some blood in her stools with multiple episodes.  The GI bleeding had resolved and the patient had a negative GI panel.  The patient had been doing well and was diagnosed this admission with acute enteritis and ileitis with sepsis hyperglycemia and was treated conservatively. The patient was to be discharged today and went to the restroom and saw a significant amount of blood.  The patient thought it may be due to hemorrhoidal bleeding due to her chronic constipation.  The patient was started on pantoprazole  40 mg IV twice a day and given rectal suppositories and a GI consult was called.  Past Medical History:  Diagnosis Date   Anemia    kid   Cataract    Constipation    COPD (chronic obstructive pulmonary disease) (HCC)    Frequent headaches    Glaucoma    Glaucoma    Hyperlipidemia    Hypertension    Pneumonia    11/2017    Postnasal drip     Past Surgical History:  Procedure Laterality Date   ABDOMINAL HYSTERECTOMY     TONSILLECTOMY      Prior to Admission medications   Medication Sig Start Date End Date Taking? Authorizing Provider  amLODipine  (NORVASC ) 5 MG tablet Take 1 tablet (5 mg total) by mouth daily. In the am 02/04/23  Yes Hope Merle, MD  amoxicillin -clavulanate (AUGMENTIN ) 500-125 MG tablet Take 1 tablet by mouth 2 (two) times daily  for 5 days. 12/04/23 12/09/23 Yes Von Bellis, MD  Baclofen  5 MG TABS TAKE 1 TABLET BY MOUTH EVERY DAY AT BEDTIME AS NEEDED 05/15/23  Yes Webb, Padonda B, FNP  bisacodyl  (DULCOLAX) 5 MG EC tablet Take 2 tablets (10 mg total) by mouth at bedtime. Skip the dose if loose stools or diarrhea 12/04/23  Yes Von Bellis, MD  conjugated estrogens (PREMARIN) vaginal cream Place 1 applicator vaginally daily.   Yes [provider]  hydrocortisone  (PROCTO-MED HC ) 2.5 % rectal cream Place rectally 2 (two) times daily as needed for hemorrhoids. 12/04/23  Yes Von Bellis, MD  lactulose  (CHRONULAC ) 10 GM/15ML solution TAKE 45 ML BY MOUTH 2 TIMES DAILY AS NEEDED FOR MILD CONSTIPATION. 11/10/23  Yes Hope Merle, MD  metroNIDAZOLE  (FLAGYL ) 500 MG tablet Take 1 tablet (500 mg total) by mouth 2 (two) times daily for 5 days. 12/04/23 12/09/23 Yes Von Bellis, MD  pantoprazole  (PROTONIX ) 40 MG tablet Take 1 tablet (40 mg total) by mouth daily. 12/04/23 01/03/24 Yes Von Bellis, MD  polyethylene glycol powder (MIRALAX ) 17 GM/SCOOP powder Take 17 g by mouth 2 (two) times daily. Skip the dose if loose stools or diarrhea 12/04/23 03/03/24 Yes Von Bellis, MD  progesterone (PROMETRIUM) 200 MG capsule Take 200 mg by mouth at bedtime. 09/18/23  Yes [provider]  traZODone  (DESYREL ) 50 MG tablet Take 0.5 tablets (25 mg total) by mouth at bedtime as needed for sleep. 12/04/23 01/03/24  Von Bellis, MD    Family History  Problem Relation Age of Onset   Heart disease Mother    Heart murmur Mother        originated from typhoid disease   Stroke Mother    Stroke Father      Social History   Tobacco Use   Smoking status: Never   Smokeless tobacco: Never  Vaping Use   Vaping status: Never Used  Substance Use Topics   Alcohol use: No   Drug use: No    Allergies as of 12/01/2023   (No Known Allergies)    Review of Systems:    All systems reviewed and negative except where noted in HPI.   Physical Exam:   Vital signs in last 24 hours: Temp:  [98.2 F (36.8 C)-99 F (37.2 C)] 98.8 F (37.1 C) (02/06 0818) Pulse Rate:  [83-99] 93 (02/06 0818) Resp:  [16-18] 17 (02/06 0818) BP: (136-149)/(57-76) 140/57 (02/06 0818) SpO2:  [94 %-100 %] 94 % (02/06 0818) Last BM Date : 12/03/23 General:   Pleasant, cooperative in NAD Head:  Normocephalic and atraumatic. Eyes:   No icterus.   Conjunctiva pink. PERRLA. Ears:  Normal auditory acuity. Neck:  Supple; no masses or thyroidomegaly Lungs: Respirations even and unlabored. Lungs clear to auscultation bilaterally.   No wheezes, crackles, or rhonchi.  Heart:  Regular rate and rhythm;  Without murmur, clicks, rubs or gallops Abdomen:  Soft, nondistended, nontender. Normal bowel sounds. No appreciable masses or hepatomegaly.  No rebound or guarding.  Rectal:  Not performed. Msk:  Symmetrical without gross deformities.    Extremities:  Without edema, cyanosis or clubbing. Neurologic:  Alert and oriented x3;  grossly normal neurologically. Skin:  Intact without significant lesions or rashes. Cervical Nodes:  No significant cervical adenopathy. Psych:  Alert and cooperative. Normal affect.  LAB RESULTS: Recent Labs    12/01/23 2333 12/03/23 0656 12/04/23 0615 12/04/23 1446  WBC 11.3* 9.4 9.6  --   HGB 14.7 10.7* 10.5* 10.8*  HCT 44.2 31.9* 30.6* 32.6*  PLT 242 159 170  --    BMET Recent Labs    12/01/23 2333 12/03/23 0656 12/04/23 0615  NA 139 136 138  K 3.8 4.1 3.8  CL 101 108 108  CO2 20* 22 23  GLUCOSE 225* 104* 82  BUN 28* 31* 20  CREATININE 1.02* 0.72 0.57  CALCIUM 8.7* 8.0* 8.2*   LFT Recent Labs    12/01/23 2333  PROT 7.0  ALBUMIN 3.7  AST 35  ALT 24  ALKPHOS 59  BILITOT 1.0   PT/INR No results for input(s): LABPROT, INR in the last 72 hours.  STUDIES: No results found.    Impression / Plan:   Assessment: Principal Problem:   Enteritis   Lynn Powell is a 83 y.o. y/o female with a history of no  recent colonoscopies with 2 Cologuard's being negative in 2019 and 2022.  The patient had rectal bleeding prior to discharge today and also had some rectal bleeding prior to admission to the hospital.  The bleeding had stopped after admission but started again today right before she was discharged.  Plan:  The patient will be set up for colonoscopy for tomorrow.  The patient will be given a MiraLAX  prep due to the likelihood that she will not be able to take 4 L  of GoLytely .  She will also be started on a clear liquid diet.  The patient has been explained the plan including risks benefits and alternatives and agrees with proceeding with a colonoscopy.  Thank you for involving me in the care of this patient.      LOS: 2 days   Rogelia Copping, MD, MD. NOLIA 12/04/2023, 4:34 PM,  Pager [phone] 7am-5pm  Check AMION for 5pm -7am coverage and on weekends   Note: This dictation was prepared with Dragon dictation along with smaller phrase technology. Any transcriptional errors that result from this process are unintentional.

## 2023-12-04 NOTE — Progress Notes (Signed)
 [facility] Progress Note  Patient: Lynn Powell    FMW:[id number]  DOA: 12/02/2023     Date of Service: the patient was seen and examined on 12/04/2023  Chief Complaint  Patient presents with   Diarrhea   Brief hospital course: Aleira Deiter is a 83 y.o. female with Past medical history of HTN, HLD, COPD, glaucoma, constipation as reviewed from EMR, presented with acute onset of nausea vomiting and diarrhea.  As per patient she got exposed to her son who was sick, had gastroenteritis and after 2 days she developed symptoms.  Nausea vomiting and diarrhea started on Monday to 325.  After few episodes of diarrhea she started having blood in the stool, multiple episodes.  No blood in the vomiting.  Denies any fever, no chest pain or palpitations, no shortness of breath. On my exam in the ED patient denied any abdominal pain, nausea and vomiting resolved, had 1 loose stool, yellow color no bleeding in the ED.     ED Course: VS afebrile, HR 102, RR 16, BP 131/85, 98% on room air BMP blood glucose 225 elevated, bicarb 20, creatinine 1.02, calcium 8.7, anion gap 18, rest within normal range. Lactic acid 2.2--1.6 improved CBC WBC 11.3 Negative COVID, flu and RSV.  UA negative CT A/P: Ileitis and enteritis, ascites.  No any other significant findings   Assessment and Plan:  # Sepsis secondary to acute enteritis and ileitis  Unknown cause.  Sepsis criteria tachycardia, leukocytosis and enteritis on CT scan Lactic acid improved with IV fluid, S/p IV fluid for hydration, VS satble Monitor vital signs     # Acute enteritis and ileitis  Nausea vomiting resolved, no abdominal pain GI bleed resolved had 1 BM loose stools in the ED GI pathogen and C. Difficile could not be collected by RN Continue empiric antibiotics ceftriaxone  and Flagyl  2/5 denies any nausea vomiting or diarrhea, tolerated diet well S/p FLD, started soft diet today 2/6 patient was discharged in the morning, as she  denied any complaints.  Later on she went to the restroom and saw rectal bleeding, she notified to RN Possible hemorrhoidal bleeding, started empiric treatment, started pantoprazole  40 mg IV twice daily, rectal hemorrhoidal suppositories. Monitor H&H GI consulted    # Hyperglycemia, no known history of diabetes hemoglobin A1c 5.3 WNL 2/5 BG 104 improved   # HTN, HLD Home meds amlodipine , held for now due to low blood pressure Monitor BP and titrate medications accordingly   # Constipation, patient was on lactulose  at home Presented with diarrhea, hold laxatives for now    Body mass index is 20.37 kg/m.  Interventions:  Diet: Soft diet DVT Prophylaxis: SCD  Advance goals of care discussion: Full code  Family Communication: family was not present at bedside, at the time of interview.  The pt provided permission to discuss medical plan with the family. Opportunity was given to ask question and all questions were answered satisfactorily.   Disposition:  Pt is from Home, admitted with enteritis and ileitis, diarrhea with bleeding, gradually improving, diet advanced today, which precludes a safe discharge. Discharge to home, when will, most likely tomorrow a.m.  Subjective: No significant events overnight, patient denied any complaints in the morning, no nausea vomiting or diarrhea, no BM, no bleeding. In the afternoon she notified RN that she went to use the bathroom and saw a lot of rectal bleeding. Will continue above management and keep her overnight.   Physical Exam: General: NAD, lying comfortably Appear in no distress,  affect appropriate Eyes: PERRLA ENT: Oral Mucosa Clear, moist  Neck: no JVD,  Cardiovascular: S1 and S2 Present, no Murmur,  Respiratory: good respiratory effort, Bilateral Air entry equal and Decreased, no Crackles, no wheezes Abdomen: Bowel Sound present, Soft and no tenderness,  Skin: no rashes Extremities: no Pedal edema, no calf  tenderness Neurologic: without any new focal findings Gait not checked due to patient safety concerns  Vitals:   12/03/23 2033 12/03/23 2034 12/04/23 0621 12/04/23 0818  BP: (!) 143/62  136/64 (!) 140/57  Pulse: 83 89 98 93  Resp: 16  18 17   Temp: 98.9 F (37.2 C)  99 F (37.2 C) 98.8 F (37.1 C)  TempSrc:   Oral   SpO2:  98% 95% 94%  Weight:      Height:        Intake/Output Summary (Last 24 hours) at 12/04/2023 1407 Last data filed at 12/04/2023 9378 Gross per 24 hour  Intake 240 ml  Output --  Net 240 ml   Filed Weights   12/01/23 2330  Weight: 52.2 kg    Data Reviewed: I have personally reviewed and interpreted daily labs, tele strips, imagings as discussed above. I reviewed all nursing notes, pharmacy notes, vitals, pertinent old records I have discussed plan of care as described above with RN and patient/family.  CBC: Recent Labs  Lab 12/01/23 2333 12/03/23 0656 12/04/23 0615  WBC 11.3* 9.4 9.6  HGB 14.7 10.7* 10.5*  HCT 44.2 31.9* 30.6*  MCV 95.9 91.7 92.2  PLT 242 159 170   Basic Metabolic Panel: Recent Labs  Lab 12/01/23 2333 12/02/23 1343 12/03/23 0656 12/04/23 0615  NA 139  --  136 138  K 3.8  --  4.1 3.8  CL 101  --  108 108  CO2 20*  --  22 23  GLUCOSE 225*  --  104* 82  BUN 28*  --  31* 20  CREATININE 1.02*  --  0.72 0.57  CALCIUM 8.7*  --  8.0* 8.2*  MG  --  2.3 2.1 2.1  PHOS  --  3.6 2.7 2.4*    Studies: No results found.  Scheduled Meds:  amLODipine   5 mg Oral Daily   hydrocortisone   25 mg Rectal BID   pantoprazole  (PROTONIX ) IV  40 mg Intravenous Q12H   phenylephrine   1 suppository Rectal Once   sodium chloride  flush  3 mL Intravenous Q12H   sodium chloride  flush  3 mL Intravenous Q12H   Continuous Infusions:  cefTRIAXone  (ROCEPHIN )  IV 2 g (12/04/23 1116)   metronidazole  500 mg (12/04/23 1005)   PRN Meds: acetaminophen  **OR** acetaminophen , hydrocortisone  **FOLLOWED BY** [START ON 12/06/2023] hydrocortisone , metoprolol   tartrate, ondansetron  **OR** ondansetron  (ZOFRAN ) IV, oxyCODONE , sodium chloride  flush, traZODone   Time spent: 55 minutes  Author: ELVAN SOR. MD [facility] 12/04/2023 2:07 PM  To reach On-call, see care teams to locate the attending and reach out to them via [email or web address]. If 7PM-7AM, please contact night-coverage If you still have difficulty reaching the attending provider, please page the [facility] (Director on Call) for [facility] on amion for assistance.

## 2023-12-05 ENCOUNTER — Encounter
Admission: EM | Disposition: A | Discharge status: Home Health | Payer: Self-pay | Source: Home / Self Care | Attending: Student

## 2023-12-05 ENCOUNTER — Inpatient Hospital Stay: Payer: Medicare Other | Admitting: General Practice

## 2023-12-05 ENCOUNTER — Encounter: Payer: Self-pay | Admitting: Student

## 2023-12-05 DIAGNOSIS — K529 Noninfective gastroenteritis and colitis, unspecified: Secondary | ICD-10-CM | POA: Diagnosis not present

## 2023-12-05 HISTORY — PX: COLONOSCOPY WITH PROPOFOL: SHX5780

## 2023-12-05 LAB — MAGNESIUM: Magnesium: 2.1 mg/dL (ref 1.7–2.4)

## 2023-12-05 LAB — PHOSPHORUS: Phosphorus: 3.1 mg/dL (ref 2.5–4.6)

## 2023-12-05 LAB — CBC
HCT: 32.2 % — ABNORMAL LOW (ref 36.0–46.0)
Hemoglobin: 11 g/dL — ABNORMAL LOW (ref 12.0–15.0)
MCH: 32.1 pg (ref 26.0–34.0)
MCHC: 34.2 g/dL (ref 30.0–36.0)
MCV: 93.9 fL (ref 80.0–100.0)
Platelets: 193 10*3/uL (ref 150–400)
RBC: 3.43 MIL/uL — ABNORMAL LOW (ref 3.87–5.11)
RDW: 14.2 % (ref 11.5–15.5)
WBC: 11 10*3/uL — ABNORMAL HIGH (ref 4.0–10.5)
nRBC: 0 % (ref 0.0–0.2)

## 2023-12-05 LAB — BASIC METABOLIC PANEL
Anion gap: 8 (ref 5–15)
BUN: 15 mg/dL (ref 8–23)
CO2: 24 mmol/L (ref 22–32)
Calcium: 8.3 mg/dL — ABNORMAL LOW (ref 8.9–10.3)
Chloride: 107 mmol/L (ref 98–111)
Creatinine, Ser: 0.57 mg/dL (ref 0.44–1.00)
GFR, Estimated: >60 mL/min (ref >60)
Glucose, Bld: 94 mg/dL (ref 70–99)
Potassium: 3.6 mmol/L (ref 3.5–5.1)
Sodium: 139 mmol/L (ref 135–145)

## 2023-12-05 SURGERY — COLONOSCOPY WITH PROPOFOL
Anesthesia: General

## 2023-12-05 MED ORDER — PHENYLEPHRINE 80 MCG/ML (10ML) SYRINGE FOR IV PUSH (FOR BLOOD PRESSURE SUPPORT)
PREFILLED_SYRINGE | INTRAVENOUS | Status: AC
  Filled 2023-12-05: qty 10

## 2023-12-05 MED ORDER — SODIUM CHLORIDE 0.9 % IV SOLN: INTRAVENOUS | Status: DC | PRN

## 2023-12-05 MED ORDER — PROPOFOL 10 MG/ML IV BOLUS
INTRAVENOUS | Status: DC | PRN
  Administered 2023-12-05 (×2): 20 mg via INTRAVENOUS
  Administered 2023-12-05: 80 mg via INTRAVENOUS

## 2023-12-05 MED ORDER — PHENYLEPHRINE 80 MCG/ML (10ML) SYRINGE FOR IV PUSH (FOR BLOOD PRESSURE SUPPORT)
PREFILLED_SYRINGE | INTRAVENOUS | Status: DC | PRN
  Administered 2023-12-05 (×2): 160 ug via INTRAVENOUS

## 2023-12-05 NOTE — Op Note (Signed)
 [facility] Gastroenterology Patient Name: Lynn Powell Procedure Date: 12/05/2023 12:21 PM MRN: [id number] Account #: [id number] Date of Birth: 1941/02/19 Admit Type: Inpatient Age: 83 Room: [facility] ENDO ROOM 1 Gender: Female Note Status: Finalized Instrument Name: Peds Colonoscope [id number] Procedure:             Colonoscopy Indications:           Hematochezia Providers:             Rogelia Copping MD, MD Referring MD:          Glenys Ferrari (Referring MD) Medicines:             Propofol  per Anesthesia Complications:         No immediate complications. Procedure:             Pre-Anesthesia Assessment:                        - Prior to the procedure, a History and Physical was                         performed, and patient medications and allergies were                         reviewed. The patient's tolerance of previous                         anesthesia was also reviewed. The risks and benefits                         of the procedure and the sedation options and risks                         were discussed with the patient. All questions were                         answered, and informed consent was obtained. Prior                         Anticoagulants: The patient has taken no anticoagulant                         or antiplatelet agents. ASA Grade Assessment: II - A                         patient with mild systemic disease. After reviewing                         the risks and benefits, the patient was deemed in                         satisfactory condition to undergo the procedure.                        After obtaining informed consent, the colonoscope was                         passed under direct vision. Throughout the procedure,  the patient's blood pressure, pulse, and oxygen                         saturations were monitored continuously. The                         Colonoscope was introduced through the anus and                          advanced to the the cecum, identified by appendiceal                         orifice and ileocecal valve. The colonoscopy was                         performed without difficulty. The patient tolerated                         the procedure well. The quality of the bowel                         preparation was fair. Findings:      The perianal and digital rectal examinations were normal.      Multiple small-mouthed diverticula were found in the sigmoid colon.      Hematin (altered blood/coffee-ground-like material) was found in the       rectum, in the sigmoid colon and in the descending colon. Impression:            - Preparation of the colon was fair.                        - Diverticulosis in the sigmoid colon.                        - Blood in the rectum, in the sigmoid colon and in the                         descending colon.                        - No specimens collected. Recommendation:        - Return patient to hospital ward for ongoing care.                        - Resume previous diet.                        - Continue present medications.                        - If any further bleeding then CT angio and IR. Procedure Code(s):     --- Professional ---                        [id number], Colonoscopy, flexible; diagnostic, including                         collection of specimen(s) by brushing or washing, when  performed (separate procedure) Diagnosis Code(s):     --- Professional ---                        K92.1, Melena (includes Hematochezia)                        K62.5, Hemorrhage of anus and rectum CPT copyright 2022 American [employer] Association. All rights reserved. The codes documented in this report are preliminary and upon coder review may  be revised to meet current compliance requirements. Rogelia Copping MD, MD 12/05/2023 1:19:31 PM This report has been signed electronically. Number of Addenda: 0 Note Initiated On: 12/05/2023 12:21 PM Scope  Withdrawal Time: 0 hours 3 minutes 49 seconds  Total Procedure Duration: 0 hours 12 minutes 12 seconds  Estimated Blood Loss:  Estimated blood loss: none.      [facility]

## 2023-12-05 NOTE — Plan of Care (Signed)
  Problem: Education: Goal: Knowledge of General Education information will improve Description: Including pain rating scale, medication(s)/side effects and non-pharmacologic comfort measures Outcome: Progressing   Problem: Clinical Measurements: Goal: Ability to maintain clinical measurements within normal limits will improve Outcome: Progressing   Problem: Activity: Goal: Risk for activity intolerance will decrease Outcome: Progressing   Problem: Nutrition: Goal: Adequate nutrition will be maintained Outcome: Progressing   Problem: Coping: Goal: Level of anxiety will decrease Outcome: Progressing   Problem: Elimination: Goal: Will not experience complications related to bowel motility Outcome: Progressing   Problem: Pain Managment: Goal: General experience of comfort will improve and/or be controlled Outcome: Progressing   Problem: Safety: Goal: Ability to remain free from injury will improve Outcome: Progressing   Problem: Skin Integrity: Goal: Risk for impaired skin integrity will decrease Outcome: Progressing

## 2023-12-05 NOTE — TOC Progression Note (Signed)
 Transition of Care [facility]) - Progression Note    Patient Details  Name: Lynn Powell MRN: [id number] Date of Birth: [birth year]  [facility] CM/SW Contact  Christien Berthelot A Kellee Sittner, RN Phone Number: 12/05/2023, 6:05 AM  Clinical Narrative:     Chart reviewed.  Noted that patient was a discharge for yesterday.  Prior to discharge patient note a significant amount of rectal bleeding. IV protonix  was ordered and GI consult also ordered.  Noted that patient scheduled for Colonoscopy today.    TOC will follow for discharge planning.           Expected Discharge Plan and Services         Expected Discharge Date: 12/04/23                                     Social Determinants of Health (SDOH) Interventions SDOH Screenings   Food Insecurity: No Food Insecurity (12/03/2023)  Housing: Low Risk  (12/03/2023)  Transportation Needs: No Transportation Needs (12/03/2023)  Utilities: Not At Risk (12/03/2023)  Alcohol Screen: Low Risk  (08/01/2023)  Depression (PHQ2-9): Low Risk  (09/08/2023)  Financial Resource Strain: Low Risk  (09/03/2023)  Physical Activity: Insufficiently Active (09/03/2023)  Social Connections: Unknown (12/03/2023)  Stress: No Stress Concern Present (09/03/2023)  Recent Concern: Stress - Stress Concern Present (08/01/2023)  Tobacco Use: Low Risk  (12/01/2023)  Health Literacy: Adequate Health Literacy (08/01/2023)    Readmission Risk Interventions     No data to display

## 2023-12-05 NOTE — Care Management Important Message (Signed)
 Important Message  Patient Details  Name: Lynn Powell MRN: [id number] Date of Birth: May 04, 1941   Important Message Given:  Yes - Medicare IM     Felix Host 12/05/2023, 11:46 AM

## 2023-12-05 NOTE — Anesthesia Preprocedure Evaluation (Signed)
 Anesthesia Evaluation  Patient identified by MRN, date of birth, ID band Patient awake    Reviewed: Allergy & Precautions, NPO status , Patient's Chart, lab work & pertinent test results  Airway Mallampati: III  TM Distance: >3 FB Neck ROM: full    Dental  (+) Chipped, Dental Advidsory Given   Pulmonary COPD,  COPD inhaler   Pulmonary exam normal        Cardiovascular hypertension, negative cardio ROS Normal cardiovascular exam     Neuro/Psych negative neurological ROS  negative psych ROS   GI/Hepatic negative GI ROS, Neg liver ROS,,,  Endo/Other  negative endocrine ROS    Renal/GU negative Renal ROS  negative genitourinary   Musculoskeletal   Abdominal   Peds  Hematology  (+) Blood dyscrasia, anemia   Anesthesia Other Findings Past Medical History: No date: Anemia     Comment:  kid No date: Cataract No date: Constipation No date: COPD (chronic obstructive pulmonary disease) (HCC) No date: Frequent headaches No date: Glaucoma No date: Glaucoma No date: Hyperlipidemia No date: Hypertension No date: Pneumonia     Comment:  11/2017  No date: Postnasal drip  Past Surgical History: No date: ABDOMINAL HYSTERECTOMY No date: TONSILLECTOMY  BMI    Body Mass Index: 20.37 kg/m      Reproductive/Obstetrics negative OB ROS                             Anesthesia Physical Anesthesia Plan  ASA: 3  Anesthesia Plan: General   Post-op Pain Management: Minimal or no pain anticipated   Induction: Intravenous  PONV Risk Score and Plan: 3 and Propofol  infusion, TIVA and Ondansetron   Airway Management Planned: Nasal Cannula  Additional Equipment: None  Intra-op Plan:   Post-operative Plan:   Informed Consent: I have reviewed the patients History and Physical, chart, labs and discussed the procedure including the risks, benefits and alternatives for the proposed anesthesia with the  patient or authorized representative who has indicated his/her understanding and acceptance.     Dental advisory given  Plan Discussed with: CRNA and Surgeon  Anesthesia Plan Comments: (Discussed risks of anesthesia with patient, including possibility of difficulty with spontaneous ventilation under anesthesia necessitating airway intervention, PONV, and rare risks such as cardiac or respiratory or neurological events, and allergic reactions. Discussed the role of CRNA in patient's perioperative care. Patient understands.)       Anesthesia Quick Evaluation

## 2023-12-05 NOTE — Anesthesia Postprocedure Evaluation (Signed)
 Anesthesia Post Note  Patient: Lynn Powell  Procedure(s) Performed: COLONOSCOPY WITH PROPOFOL   Patient location during evaluation: Endoscopy Anesthesia Type: General Level of consciousness: awake and alert Pain management: pain level controlled Vital Signs Assessment: post-procedure vital signs reviewed and stable Respiratory status: spontaneous breathing, nonlabored ventilation, respiratory function stable and patient connected to nasal cannula oxygen Cardiovascular status: blood pressure returned to baseline and stable Postop Assessment: no apparent nausea or vomiting Anesthetic complications: no  No notable events documented.   Last Vitals:  Vitals:   12/05/23 1303 12/05/23 1310  BP: (!) 120/53 (!) 125/51  Pulse:  92  Resp: 16 15  Temp: (!) 36 C   SpO2: 100% 97%    Last Pain:  Vitals:   12/05/23 1310  TempSrc:   PainSc: Asleep                 Debby Mines

## 2023-12-05 NOTE — Progress Notes (Signed)
 [facility] Progress Note  Patient: Lynn Powell    FMW:[id number]  DOA: 12/02/2023     Date of Service: the patient was seen and examined on 12/05/2023  Chief Complaint  Patient presents with   Diarrhea   Brief hospital course: Lynn Powell is a 83 y.o. female with Past medical history of HTN, HLD, COPD, glaucoma, constipation as reviewed from EMR, presented with acute onset of nausea vomiting and diarrhea.  As per patient she got exposed to her son who was sick, had gastroenteritis and after 2 days she developed symptoms.  Nausea vomiting and diarrhea started on Monday to 325.  After few episodes of diarrhea she started having blood in the stool, multiple episodes.  No blood in the vomiting.  Denies any fever, no chest pain or palpitations, no shortness of breath. On my exam in the ED patient denied any abdominal pain, nausea and vomiting resolved, had 1 loose stool, yellow color no bleeding in the ED.     ED Course: VS afebrile, HR 102, RR 16, BP 131/85, 98% on room air BMP blood glucose 225 elevated, bicarb 20, creatinine 1.02, calcium 8.7, anion gap 18, rest within normal range. Lactic acid 2.2--1.6 improved CBC WBC 11.3 Negative COVID, flu and RSV.  UA negative CT A/P: Ileitis and enteritis, ascites.  No any other significant findings   Assessment and Plan:  # Sepsis secondary to acute enteritis and ileitis  Unknown cause.  Sepsis criteria tachycardia, leukocytosis and enteritis on CT scan Lactic acid improved with IV fluid, S/p IV fluid for hydration, VS satble Monitor vital signs    # Acute enteritis and ileitis  Nausea vomiting resolved, no abdominal pain GI bleed resolved had 1 BM loose stools in the ED GI pathogen and C. Difficile could not be collected by RN Continue empiric antibiotics ceftriaxone  and Flagyl  2/5 denies any nausea vomiting or diarrhea, tolerated diet well S/p FLD, started soft diet today 2/6 patient was discharged in the morning, as she denied  any complaints.  Later on she went to the restroom and saw rectal bleeding, she notified to RN Possible hemorrhoidal bleeding, started empiric treatment, started pantoprazole  40 mg IV twice daily, rectal hemorrhoidal suppositories. Monitor H&H GI consulted, s/p colonoscopy done on 12/05/2023. Preparation of the colon was fair. Diverticulosis in the sigmoid colon. Blood in the rectum, in the sigmoid colon and in the descending colon. No specimens collected. GI recommended CT angiogram and IR consult if recurrence of bleeding.   # Hyperglycemia, no known history of diabetes hemoglobin A1c 5.3 WNL 2/5 BG 104 improved   # HTN, HLD Home meds amlodipine , held for now due to low blood pressure Monitor BP and titrate medications accordingly   # Constipation, patient was on lactulose  at home Presented with diarrhea, hold laxatives for now    Body mass index is 20.37 kg/m.  Interventions:  Diet: Soft diet DVT Prophylaxis: SCD  Advance goals of care discussion: Full code  Family Communication: family was not present at bedside, at the time of interview.  The pt provided permission to discuss medical plan with the family. Opportunity was given to ask question and all questions were answered satisfactorily.   Disposition:  Pt is from Home, admitted with enteritis and ileitis, diarrhea with bleeding, gradually improving, diet advanced today, which precludes a safe discharge. Discharge to home, when will, most likely tomorrow a.m.  Subjective: No significant events overnight, patient was seen after colonoscopy, tolerated procedure well. Patient had a loose bowel movement after  colonoscopy, did not notice any bleeding. Denied any abdominal pain, no nausea vomiting.  No chest pain or palpitation, no shortness of breath.   Physical Exam: General: NAD, lying comfortably Appear in no distress, affect appropriate Eyes: PERRLA ENT: Oral Mucosa Clear, moist  Neck: no JVD,  Cardiovascular: S1 and  S2 Present, no Murmur,  Respiratory: good respiratory effort, Bilateral Air entry equal and Decreased, no Crackles, no wheezes Abdomen: Bowel Sound present, Soft and no tenderness,  Skin: no rashes Extremities: no Pedal edema, no calf tenderness Neurologic: without any new focal findings Gait not checked due to patient safety concerns  Vitals:   12/05/23 0749 12/05/23 1303 12/05/23 1310 12/05/23 1320  BP: (!) 128/59 (!) 120/53 (!) 125/51 120/78  Pulse: 96 90 92 88  Resp: 20 16 15 16   Temp: 98.8 F (37.1 C) (!) 96.8 F (36 C)    TempSrc: Oral Temporal    SpO2: 96% 100% 97% 96%  Weight:      Height:        Intake/Output Summary (Last 24 hours) at 12/05/2023 1438 Last data filed at 12/05/2023 1303 Gross per 24 hour  Intake 100 ml  Output --  Net 100 ml   Filed Weights   12/01/23 2330  Weight: 52.2 kg    Data Reviewed: I have personally reviewed and interpreted daily labs, tele strips, imagings as discussed above. I reviewed all nursing notes, pharmacy notes, vitals, pertinent old records I have discussed plan of care as described above with RN and patient/family.  CBC: Recent Labs  Lab 12/01/23 2333 12/03/23 0656 12/04/23 0615 12/04/23 1446 12/05/23 0339  WBC 11.3* 9.4 9.6  --  11.0*  HGB 14.7 10.7* 10.5* 10.8* 11.0*  HCT 44.2 31.9* 30.6* 32.6* 32.2*  MCV 95.9 91.7 92.2  --  93.9  PLT 242 159 170  --  193   Basic Metabolic Panel: Recent Labs  Lab 12/01/23 2333 12/02/23 1343 12/03/23 0656 12/04/23 0615 12/05/23 0339  NA 139  --  136 138 139  K 3.8  --  4.1 3.8 3.6  CL 101  --  108 108 107  CO2 20*  --  22 23 24   GLUCOSE 225*  --  104* 82 94  BUN 28*  --  31* 20 15  CREATININE 1.02*  --  0.72 0.57 0.57  CALCIUM 8.7*  --  8.0* 8.2* 8.3*  MG  --  2.3 2.1 2.1 2.1  PHOS  --  3.6 2.7 2.4* 3.1    Studies: No results found.  Scheduled Meds:  amLODipine   5 mg Oral Daily   hydrocortisone   25 mg Rectal BID   pantoprazole  (PROTONIX ) IV  40 mg Intravenous Q12H    phenylephrine   1 suppository Rectal Once   sodium chloride  flush  3 mL Intravenous Q12H   sodium chloride  flush  3 mL Intravenous Q12H   Continuous Infusions:  cefTRIAXone  (ROCEPHIN )  IV 2 g (12/05/23 1144)   metronidazole  500 mg (12/04/23 2206)   PRN Meds: acetaminophen  **OR** acetaminophen , hydrocortisone  **FOLLOWED BY** [START ON 12/06/2023] hydrocortisone , metoprolol  tartrate, ondansetron  **OR** ondansetron  (ZOFRAN ) IV, oxyCODONE , sodium chloride  flush, traZODone   Time spent: 40 minutes  Author: ELVAN SOR. MD [facility] 12/05/2023 2:38 PM  To reach On-call, see care teams to locate the attending and reach out to them via [email or web address]. If 7PM-7AM, please contact night-coverage If you still have difficulty reaching the attending provider, please page the [facility] (Director on Call) for [facility] on amion for assistance.

## 2023-12-05 NOTE — Transfer of Care (Signed)
 Immediate Anesthesia Transfer of Care Note  Patient: Lynn Powell  Procedure(s) Performed: COLONOSCOPY WITH PROPOFOL   Patient Location: PACU and Endoscopy Unit  Anesthesia Type:General  Level of Consciousness: drowsy and patient cooperative  Airway & Oxygen Therapy: Patient Spontanous Breathing  Post-op Assessment: Report given to RN and Post -op Vital signs reviewed and stable  Post vital signs: Reviewed and stable  Last Vitals:  Vitals Value Taken Time  BP 120/53 12/05/23 1303  Temp 36 C 12/05/23 1303  Pulse    Resp 16 12/05/23 1303  SpO2 100 % 12/05/23 1303    Last Pain:  Vitals:   12/05/23 1303  TempSrc: Temporal  PainSc: Asleep      Patients Stated Pain Goal: 0 (12/03/23 2252)  Complications: No notable events documented.

## 2023-12-06 DIAGNOSIS — K529 Noninfective gastroenteritis and colitis, unspecified: Secondary | ICD-10-CM | POA: Diagnosis not present

## 2023-12-06 DIAGNOSIS — K625 Hemorrhage of anus and rectum: Secondary | ICD-10-CM | POA: Diagnosis not present

## 2023-12-06 LAB — BASIC METABOLIC PANEL
Anion gap: 12 (ref 5–15)
BUN: 19 mg/dL (ref 8–23)
CO2: 21 mmol/L — ABNORMAL LOW (ref 22–32)
Calcium: 7.9 mg/dL — ABNORMAL LOW (ref 8.9–10.3)
Chloride: 106 mmol/L (ref 98–111)
Creatinine, Ser: 0.7 mg/dL (ref 0.44–1.00)
GFR, Estimated: >60 mL/min (ref >60)
Glucose, Bld: 64 mg/dL — ABNORMAL LOW (ref 70–99)
Potassium: 3.3 mmol/L — ABNORMAL LOW (ref 3.5–5.1)
Sodium: 139 mmol/L (ref 135–145)

## 2023-12-06 LAB — CBC
HCT: 26.9 % — ABNORMAL LOW (ref 36.0–46.0)
Hemoglobin: 9.1 g/dL — ABNORMAL LOW (ref 12.0–15.0)
MCH: 31.4 pg (ref 26.0–34.0)
MCHC: 33.8 g/dL (ref 30.0–36.0)
MCV: 92.8 fL (ref 80.0–100.0)
Platelets: 180 10*3/uL (ref 150–400)
RBC: 2.9 MIL/uL — ABNORMAL LOW (ref 3.87–5.11)
RDW: 14.2 % (ref 11.5–15.5)
WBC: 6 10*3/uL (ref 4.0–10.5)
nRBC: 0 % (ref 0.0–0.2)

## 2023-12-06 MED ORDER — POTASSIUM CHLORIDE CRYS ER 20 MEQ PO TBCR
40.0000 meq | EXTENDED_RELEASE_TABLET | Freq: Once | ORAL | Status: AC
  Administered 2023-12-06: 40 meq via ORAL
  Filled 2023-12-06: qty 2

## 2023-12-06 NOTE — Plan of Care (Signed)
  Problem: Education: Goal: Knowledge of General Education information will improve Description: Including pain rating scale, medication(s)/side effects and non-pharmacologic comfort measures Outcome: Progressing   Problem: Clinical Measurements: Goal: Ability to maintain clinical measurements within normal limits will improve Outcome: Progressing   Problem: Activity: Goal: Risk for activity intolerance will decrease Outcome: Progressing   Problem: Nutrition: Goal: Adequate nutrition will be maintained Outcome: Progressing   Problem: Coping: Goal: Level of anxiety will decrease Outcome: Progressing   Problem: Elimination: Goal: Will not experience complications related to bowel motility Outcome: Progressing   Problem: Pain Managment: Goal: General experience of comfort will improve and/or be controlled Outcome: Progressing   Problem: Safety: Goal: Ability to remain free from injury will improve Outcome: Progressing   Problem: Skin Integrity: Goal: Risk for impaired skin integrity will decrease Outcome: Progressing

## 2023-12-06 NOTE — Plan of Care (Signed)
  Problem: Education: Goal: Knowledge of General Education information will improve Description: Including pain rating scale, medication(s)/side effects and non-pharmacologic comfort measures Outcome: Progressing   Problem: Health Behavior/Discharge Planning: Goal: Ability to manage health-related needs will improve Outcome: Progressing   Problem: Clinical Measurements: Goal: Will remain free from infection Outcome: Progressing Goal: Diagnostic test results will improve Outcome: Progressing Goal: Respiratory complications will improve Outcome: Progressing Goal: Cardiovascular complication will be avoided Outcome: Progressing   Problem: Nutrition: Goal: Adequate nutrition will be maintained Outcome: Progressing   Problem: Coping: Goal: Level of anxiety will decrease Outcome: Progressing   Problem: Elimination: Goal: Will not experience complications related to bowel motility Outcome: Progressing   Problem: Pain Managment: Goal: General experience of comfort will improve and/or be controlled Outcome: Progressing   Problem: Safety: Goal: Ability to remain free from injury will improve Outcome: Progressing   Problem: Skin Integrity: Goal: Risk for impaired skin integrity will decrease Outcome: Progressing

## 2023-12-06 NOTE — Plan of Care (Signed)

## 2023-12-06 NOTE — Progress Notes (Signed)
 [facility]  PROGRESS NOTE  Lynn Powell FMW:[id number] DOB: 1941-03-08 DOA: 12/02/2023 PCP: Lynn Merle, MD   Brief HPI:    83 y.o. female with Past medical history of HTN, HLD, COPD, glaucoma, constipation as reviewed from EMR, presented with acute onset of nausea vomiting and diarrhea.  As per patient she got exposed to her son who was sick, had gastroenteritis and after 2 days she developed symptoms.  Nausea vomiting and diarrhea started on Monday to 325.  After few episodes of diarrhea she started having blood in the stool, multiple episodes.  No blood in the vomiting.  Denies any fever, no chest pain or palpitations, no shortness of breath. On my exam in the ED patient denied any abdominal pain, nausea and vomiting resolved, had 1 loose stool, yellow color no bleeding in the ED.    Assessment/Plan:    Sepsis secondary to acute enteritis and ileitis  Unknown cause.  Sepsis criteria tachycardia, leukocytosis and enteritis on CT scan Lactic acid improved with IV fluid, S/p IV fluid for hydration, VS satble Monitor vital signs    # Acute enteritis and ileitis  Nausea vomiting resolved, no abdominal pain GI bleed resolved had 1 BM loose stools in the ED GI pathogen and C. Difficile could not be collected by RN Continue empiric antibiotics ceftriaxone  and Flagyl  2/5 denies any nausea vomiting or diarrhea, tolerated diet well S/p FLD, started soft diet today 2/6 patient was discharged in the morning, as she denied any complaints.  Later on she went to the restroom and saw rectal bleeding, she notified to RN Possible hemorrhoidal bleeding, started empiric treatment, started pantoprazole  40 mg IV twice daily, rectal hemorrhoidal suppositories. Monitor H&H GI consulted, s/p colonoscopy done on 12/05/2023. Preparation of the colon was fair. Diverticulosis in the sigmoid colon. Blood in the rectum, in the sigmoid colon and in the descending colon. No specimens collected. GI recommended  CT angiogram and IR consult if recurrence of bleeding. -Continue to monitor     # Hyperglycemia, no known history of diabetes hemoglobin A1c 5.3 WNL    # HTN, HLD Home meds amlodipine , held for now due to low blood pressure Monitor BP and titrate medications accordingly   # Constipation, patient was on lactulose  at home Presented with diarrhea, hold laxatives for now  Hypokalemia -Potassium is 3.3 -Will replace potassium and follow BMP in am  Medications     amLODipine   5 mg Oral Daily   pantoprazole  (PROTONIX ) IV  40 mg Intravenous Q12H   phenylephrine   1 suppository Rectal Once   sodium chloride  flush  3 mL Intravenous Q12H   sodium chloride  flush  3 mL Intravenous Q12H     Data Reviewed:   CBG:  No results for input(s): GLUCAP in the last 168 hours.  SpO2: 96 % O2 Flow Rate (L/min): 4 L/min    Vitals:   12/05/23 1320 12/05/23 1618 12/05/23 2053 12/06/23 0810  BP: 120/78 130/62 (!) 130/51 (!) 118/50  Pulse: 88 (!) 104 (!) 104 97  Resp: 16 20 16 16   Temp:  98 F (36.7 C) 99.7 F (37.6 C) 99.1 F (37.3 C)  TempSrc:  Oral    SpO2: 96% 98% 96% 96%  Weight:      Height:          Data Reviewed:  Basic Metabolic Panel: Recent Labs  Lab 12/01/23 2333 12/02/23 1343 12/03/23 0656 12/04/23 0615 12/05/23 0339 12/06/23 0621  NA 139  --  136 138 139 139  K 3.8  --  4.1 3.8 3.6 3.3*  CL 101  --  108 108 107 106  CO2 20*  --  22 23 24  21*  GLUCOSE 225*  --  104* 82 94 64*  BUN 28*  --  31* 20 15 19   CREATININE 1.02*  --  0.72 0.57 0.57 0.70  CALCIUM 8.7*  --  8.0* 8.2* 8.3* 7.9*  MG  --  2.3 2.1 2.1 2.1  --   PHOS  --  3.6 2.7 2.4* 3.1  --     CBC: Recent Labs  Lab 12/01/23 2333 12/03/23 0656 12/04/23 0615 12/04/23 1446 12/05/23 0339 12/06/23 0621  WBC 11.3* 9.4 9.6  --  11.0* 6.0  HGB 14.7 10.7* 10.5* 10.8* 11.0* 9.1*  HCT 44.2 31.9* 30.6* 32.6* 32.2* 26.9*  MCV 95.9 91.7 92.2  --  93.9 92.8  PLT 242 159 170  --  193 180     LFT Recent Labs  Lab 12/01/23 2333  AST 35  ALT 24  ALKPHOS 59  BILITOT 1.0  PROT 7.0  ALBUMIN 3.7     Antibiotics: Anti-infectives (From admission, onward)    Start     Dose/Rate Route Frequency Ordered Stop   12/04/23 0000  amoxicillin -clavulanate (AUGMENTIN ) 500-125 MG tablet        1 tablet Oral 2 times daily 12/04/23 1024 12/09/23 2359   12/04/23 0000  metroNIDAZOLE  (FLAGYL ) 500 MG tablet        500 mg Oral 2 times daily 12/04/23 1024 12/09/23 2359   12/03/23 1000  cefTRIAXone  (ROCEPHIN ) 2 g in sodium chloride  0.9 % 100 mL IVPB        2 g 200 mL/hr over 30 Minutes Intravenous Every 24 hours 12/02/23 1330     12/02/23 2200  metroNIDAZOLE  (FLAGYL ) IVPB 500 mg        500 mg 100 mL/hr over 60 Minutes Intravenous Every 12 hours 12/02/23 1330     12/02/23 0930  cefTRIAXone  (ROCEPHIN ) 2 g in sodium chloride  0.9 % 100 mL IVPB        2 g 200 mL/hr over 30 Minutes Intravenous Once 12/02/23 0921 12/02/23 1031   12/02/23 0930  metroNIDAZOLE  (FLAGYL ) IVPB 500 mg        500 mg 100 mL/hr over 60 Minutes Intravenous  Once 12/02/23 0921 12/02/23 1213        DVT prophylaxis: SCDs  Code Status: Full code  Family Communication: No family at bedside   CONSULTS gastroenterology   Subjective   Continues to have diarrhea.  Denies bloody bowel movements.   Objective    Physical Examination:  General-appears in no acute distress Heart-S1-S2, regular, no murmur auscultated Lungs-clear to auscultation bilaterally, no wheezing or crackles auscultated Abdomen-soft, nontender, no organomegaly Extremities-no edema in the lower extremities Neuro-alert, oriented x3, no focal deficit noted   Status is: Inpatient:             Lynn Powell   [facility] If 7PM-7AM, please contact night-coverage at [email or web address], Office  [phone]   12/06/2023, 12:08 PM  LOS: 4 days

## 2023-12-07 DIAGNOSIS — K625 Hemorrhage of anus and rectum: Secondary | ICD-10-CM | POA: Diagnosis not present

## 2023-12-07 DIAGNOSIS — K529 Noninfective gastroenteritis and colitis, unspecified: Secondary | ICD-10-CM | POA: Diagnosis not present

## 2023-12-07 LAB — GASTROINTESTINAL PANEL BY PCR, STOOL (REPLACES STOOL CULTURE)

## 2023-12-07 LAB — CBC
HCT: 26.6 % — ABNORMAL LOW (ref 36.0–46.0)
Hemoglobin: 9 g/dL — ABNORMAL LOW (ref 12.0–15.0)
MCH: 31.6 pg (ref 26.0–34.0)
MCHC: 33.8 g/dL (ref 30.0–36.0)
MCV: 93.3 fL (ref 80.0–100.0)
Platelets: 193 10*3/uL (ref 150–400)
RBC: 2.85 MIL/uL — ABNORMAL LOW (ref 3.87–5.11)
RDW: 14.5 % (ref 11.5–15.5)
WBC: 8.5 10*3/uL (ref 4.0–10.5)
nRBC: 0 % (ref 0.0–0.2)

## 2023-12-07 LAB — BASIC METABOLIC PANEL
Anion gap: 8 (ref 5–15)
BUN: 16 mg/dL (ref 8–23)
CO2: 20 mmol/L — ABNORMAL LOW (ref 22–32)
Calcium: 7.8 mg/dL — ABNORMAL LOW (ref 8.9–10.3)
Chloride: 107 mmol/L (ref 98–111)
Creatinine, Ser: 0.65 mg/dL (ref 0.44–1.00)
GFR, Estimated: >60 mL/min (ref >60)
Glucose, Bld: 89 mg/dL (ref 70–99)
Potassium: 3.7 mmol/L (ref 3.5–5.1)
Sodium: 135 mmol/L (ref 135–145)

## 2023-12-07 LAB — CULTURE, BLOOD (ROUTINE X 2)
Culture: NO GROWTH
Culture: NO GROWTH
Special Requests: ADEQUATE

## 2023-12-07 MED ORDER — LOPERAMIDE HCL 2 MG PO CAPS
2.0000 mg | ORAL_CAPSULE | Freq: Once | ORAL | Status: AC
  Administered 2023-12-07: 2 mg via ORAL
  Filled 2023-12-07: qty 1

## 2023-12-07 MED ORDER — METOCLOPRAMIDE HCL 5 MG/ML IJ SOLN
5.0000 mg | Freq: Three times a day (TID) | INTRAMUSCULAR | Status: DC
  Administered 2023-12-07 (×2): 5 mg via INTRAVENOUS
  Filled 2023-12-07 (×2): qty 2

## 2023-12-07 NOTE — Progress Notes (Signed)
 [facility]  PROGRESS NOTE  Lynn Powell FMW:[id number] DOB: 1940-12-12 DOA: 12/02/2023 PCP: Hope Merle, MD   Brief HPI:    83 y.o. female with Past medical history of HTN, HLD, COPD, glaucoma, constipation as reviewed from EMR, presented with acute onset of nausea vomiting and diarrhea.  As per patient she got exposed to her son who was sick, had gastroenteritis and after 2 days she developed symptoms.  Nausea vomiting and diarrhea started on Monday to 325.  After few episodes of diarrhea she started having blood in the stool, multiple episodes.  No blood in the vomiting.  Denies any fever, no chest pain or palpitations, no shortness of breath. On my exam in the ED patient denied any abdominal pain, nausea and vomiting resolved, had 1 loose stool, yellow color no bleeding in the ED.    Assessment/Plan:    Sepsis secondary to acute enteritis and ileitis  -Sepsis physiology has resolved Unknown cause.  Sepsis criteria tachycardia, leukocytosis and enteritis on CT scan Lactic acid improved with IV fluid, S/p IV fluid for hydration, VS satble Monitor vital signs    # Acute enteritis and ileitis  Nausea vomiting resolved, no abdominal pain GI bleed resolved  GI pathogen panel obtained Continue empiric antibiotics ceftriaxone  and Flagyl  2/5 denies any nausea vomiting or diarrhea, tolerated diet well S/p FLD, started soft diet today 2/6 patient was discharged in the morning, as she denied any complaints.  Later on she went to the restroom and saw rectal bleeding, she notified to RN Possible hemorrhoidal bleeding, started empiric treatment, started pantoprazole  40 mg IV twice daily, rectal hemorrhoidal suppositories. Monitor H&H GI consulted, s/p colonoscopy done on 12/05/2023. Preparation of the colon was fair. Diverticulosis in the sigmoid colon. Blood in the rectum, in the sigmoid colon and in the descending colon. No specimens collected. GI recommended CT angiogram and IR consult if  recurrence of bleeding. -Continue to monitor -Advance diet to soft diet.  Dyspepsia/bloating -Start Reglan  5 mg IV every 8 hours for 3 doses     # Hyperglycemia, no known history of diabetes hemoglobin A1c 5.3 WNL    # HTN, HLD Home meds amlodipine , held for now due to low blood pressure Monitor BP and titrate medications accordingly   # Constipation, patient was on lactulose  at home Presented with diarrhea, hold laxatives for now  Hypokalemia -Replete  Medications     amLODipine   5 mg Oral Daily   pantoprazole  (PROTONIX ) IV  40 mg Intravenous Q12H   phenylephrine   1 suppository Rectal Once   sodium chloride  flush  3 mL Intravenous Q12H   sodium chloride  flush  3 mL Intravenous Q12H     Data Reviewed:   CBG:  No results for input(s): GLUCAP in the last 168 hours.  SpO2: 96 % O2 Flow Rate (L/min): 4 L/min    Vitals:   12/06/23 0810 12/06/23 1543 12/06/23 2028 12/07/23 0344  BP: (!) 118/50 (!) 118/53 (!) 123/58 (!) 108/47  Pulse: 97 93 94 87  Resp: 16 18 18 18   Temp: 99.1 F (37.3 C) 99.4 F (37.4 C) 100.2 F (37.9 C) 99.9 F (37.7 C)  TempSrc:   Oral   SpO2: 96% 97% 96% 96%  Weight:      Height:          Data Reviewed:  Basic Metabolic Panel: Recent Labs  Lab 12/02/23 1343 12/03/23 0656 12/04/23 0615 12/05/23 0339 12/06/23 0621 12/07/23 0541  NA  --  136 138 139 139 135  K  --  4.1 3.8 3.6 3.3* 3.7  CL  --  108 108 107 106 107  CO2  --  22 23 24  21* 20*  GLUCOSE  --  104* 82 94 64* 89  BUN  --  31* 20 15 19 16   CREATININE  --  0.72 0.57 0.57 0.70 0.65  CALCIUM  --  8.0* 8.2* 8.3* 7.9* 7.8*  MG 2.3 2.1 2.1 2.1  --   --   PHOS 3.6 2.7 2.4* 3.1  --   --     CBC: Recent Labs  Lab 12/03/23 0656 12/04/23 0615 12/04/23 1446 12/05/23 0339 12/06/23 0621 12/07/23 0541  WBC 9.4 9.6  --  11.0* 6.0 8.5  HGB 10.7* 10.5* 10.8* 11.0* 9.1* 9.0*  HCT 31.9* 30.6* 32.6* 32.2* 26.9* 26.6*  MCV 91.7 92.2  --  93.9 92.8 93.3  PLT 159 170  --   193 180 193    LFT Recent Labs  Lab 12/01/23 2333  AST 35  ALT 24  ALKPHOS 59  BILITOT 1.0  PROT 7.0  ALBUMIN 3.7     Antibiotics: Anti-infectives (From admission, onward)    Start     Dose/Rate Route Frequency Ordered Stop   12/04/23 0000  amoxicillin -clavulanate (AUGMENTIN ) 500-125 MG tablet        1 tablet Oral 2 times daily 12/04/23 1024 12/09/23 2359   12/04/23 0000  metroNIDAZOLE  (FLAGYL ) 500 MG tablet        500 mg Oral 2 times daily 12/04/23 1024 12/09/23 2359   12/03/23 1000  cefTRIAXone  (ROCEPHIN ) 2 g in sodium chloride  0.9 % 100 mL IVPB        2 g 200 mL/hr over 30 Minutes Intravenous Every 24 hours 12/02/23 1330     12/02/23 2200  metroNIDAZOLE  (FLAGYL ) IVPB 500 mg        500 mg 100 mL/hr over 60 Minutes Intravenous Every 12 hours 12/02/23 1330     12/02/23 0930  cefTRIAXone  (ROCEPHIN ) 2 g in sodium chloride  0.9 % 100 mL IVPB        2 g 200 mL/hr over 30 Minutes Intravenous Once 12/02/23 0921 12/02/23 1031   12/02/23 0930  metroNIDAZOLE  (FLAGYL ) IVPB 500 mg        500 mg 100 mL/hr over 60 Minutes Intravenous  Once 12/02/23 0921 12/02/23 1213        DVT prophylaxis: SCDs  Code Status: Full code  Family Communication: No family at bedside   CONSULTS gastroenterology   Subjective   Still having small loose BMs.  Complains of abdominal fullness and bloating.   Objective    Physical Examination:  General-appears in no acute distress Heart-S1-S2, regular, no murmur auscultated Lungs-clear to auscultation bilaterally, no wheezing or crackles auscultated Abdomen-soft, nontender, no organomegaly Extremities-no edema in the lower extremities Neuro-alert, oriented x3, no focal deficit noted   Status is: Inpatient:             Lynn Powell   [facility] If 7PM-7AM, please contact night-coverage at [email or web address], Office  [phone]   12/07/2023, 7:57 AM  LOS: 5 days

## 2023-12-07 NOTE — Progress Notes (Signed)
 Mobility Specialist - Progress Note   12/07/23 1331  Mobility  Activity Ambulated independently in hallway;Stood at bedside;Dangled on edge of bed  Level of Assistance Independent  Assistive Device None  Distance Ambulated (ft) 300 ft  Activity Response Tolerated well  Mobility Referral Yes  Mobility visit 1 Mobility  Mobility Specialist Start Time (ACUTE ONLY) 1257  Mobility Specialist Stop Time (ACUTE ONLY) 1305  Mobility Specialist Time Calculation (min) (ACUTE ONLY) 8 min   Pt supine in bed on RA upon arrival. Pt completes bed mobility, STS, and ambulates in hallway indep. Pt returns to EOB with needs in reach.   Otho Louder  Mobility Specialist  12/07/23 1:32 PM

## 2023-12-07 NOTE — Evaluation (Signed)
 Physical Therapy Evaluation Patient Details Name: Lynn Powell MRN: [id number] DOB: May 25, 1941 Today's Date: 12/07/2023  History of Present Illness  Lynn Powell is a 83 y.o. female with Past medical history of HTN, HLD, COPD, glaucoma, constipation as reviewed from EMR, presented with acute onset of nausea vomiting and diarrhea.  As per patient she got exposed to her son who was sick, had gastroenteritis and after 2 days she developed symptoms.  Nausea vomiting and diarrhea started on Monday to 325.  After few episodes of diarrhea she started having blood in the stool, multiple episodes.  Clinical Impression  Pt recently finished AMB with mobility specialist, agreeable to PT session and additional mobility. Pt performs all mobility with modified indepdnence, no device needed, supervision required for safety given presence of IV line. Pt has 1 LOB with head turn in session, but corrects appropriately, no assist needed. Will continue to follow. Pt is moving well in general, could easily meet her basic mobility needs in home at [place].       If plan is discharge home, recommend the following: Assist for transportation;Direct supervision/assist for medications management   Can travel by private vehicle        Equipment Recommendations None recommended by PT  Recommendations for Other Services       Functional Status Assessment Patient has had a recent decline in their functional status and demonstrates the ability to make significant improvements in function in a reasonable and predictable amount of time.     Precautions / Restrictions Precautions Precautions: Fall Restrictions Weight Bearing Restrictions Per Provider Order: No      Mobility  Bed Mobility Overal bed mobility: Modified Independent                  Transfers Overall transfer level: Needs assistance Equipment used: None Transfers: Sit to/from Stand Sit to Stand: Supervision           General transfer  comment: almost sat on her IV line    Ambulation/Gait Ambulation/Gait assistance: Supervision Gait Distance (Feet): 370 Feet Assistive device: None   Gait velocity: 1.55m/s     General Gait Details: movng well, 1 slight LOB while turning head to say somthing to author while AMB,no assist needed; poor IV line awareness when going in for a landing at commode.  Stairs            Wheelchair Mobility     Tilt Bed    Modified Rankin (Stroke Patients Only)       Balance                                             Pertinent Vitals/Pain Pain Assessment Pain Assessment: 0-10 Pain Score: 5  Pain Location: ABD Pain Intervention(s): Premedicated before session, Monitored during session    Home Living Family/patient expects to be discharged to:: Private residence Living Arrangements: Alone (18yo Cat) Available Help at Discharge: Family (Son, DIL  live in [place] near [place]) Type of Home:  (townhouse) Home Access: Stairs to enter   Secretary/administrator of Steps: 1 Alternate Level Stairs-Number of Steps: 1 step at front door or garage entrance Home Layout: Two level Home Equipment: None      Prior Function Prior Level of Function : Independent/Modified Independent;Driving;Working/employed;History of Falls (last six months)             Mobility Comments: independent  ADLs Comments: independent     Extremity/Trunk Assessment                Communication      Cognition Arousal: Alert Behavior During Therapy: WFL for tasks assessed/performed Overall Cognitive Status: Within Functional Limits for tasks assessed                                          General Comments      Exercises     Assessment/Plan    PT Assessment Patient needs continued PT services  PT Problem List Decreased activity tolerance;Decreased balance;Decreased mobility       PT Treatment Interventions DME instruction;Gait training;Stair  training;Functional mobility training;Therapeutic activities;Therapeutic exercise    PT Goals (Current goals can be found in the Care Plan section)  Acute Rehab PT Goals Patient Stated Goal: return to home without IV PT Goal Formulation: With patient Time For Goal Achievement: 12/21/23 Potential to Achieve Goals: Good    Frequency Min 1X/week     Co-evaluation               AM-PAC PT 6 Clicks Mobility  Outcome Measure Help needed turning from your back to your side while in a flat bed without using bedrails?: None Help needed moving from lying on your back to sitting on the side of a flat bed without using bedrails?: None Help needed moving to and from a bed to a chair (including a wheelchair)?: None Help needed standing up from a chair using your arms (e.g., wheelchair or bedside chair)?: None Help needed to walk in hospital room?: None Help needed climbing 3-5 steps with a railing? : A Little 6 Click Score: 23    End of Session Equipment Utilized During Treatment: Gait belt Activity Tolerance: Patient tolerated treatment well;No increased pain Patient left: in chair;with call bell/phone within reach Nurse Communication: Mobility status (IV access leaking) PT Visit Diagnosis: Muscle weakness (generalized) (M62.81);Other abnormalities of gait and mobility (R26.89)    Time: 8587-8553 PT Time Calculation (min) (ACUTE ONLY): 34 min   Charges:   PT Evaluation $PT Eval Moderate Complexity: 1 Mod PT Treatments $Therapeutic Activity: 8-22 mins PT General Charges $$ ACUTE PT VISIT: 1 Visit        3:00 PM, 12/07/23 Lynn Powell, PT, DPT Physical Therapist - [facility]  [phone] (ASCOM)   Lynn Powell 12/07/2023, 2:58 PM

## 2023-12-08 ENCOUNTER — Encounter: Payer: Self-pay | Admitting: Gastroenterology

## 2023-12-08 ENCOUNTER — Inpatient Hospital Stay: Payer: Medicare Other | Admitting: Family Medicine

## 2023-12-08 ENCOUNTER — Other Ambulatory Visit: Payer: Self-pay

## 2023-12-08 DIAGNOSIS — K529 Noninfective gastroenteritis and colitis, unspecified: Secondary | ICD-10-CM

## 2023-12-08 LAB — TROPONIN I (HIGH SENSITIVITY): Troponin I (High Sensitivity): 16 ng/L (ref <18)

## 2023-12-08 MED ORDER — METRONIDAZOLE 500 MG PO TABS: 500.0000 mg | ORAL_TABLET | Freq: Two times a day (BID) | ORAL | Status: DC

## 2023-12-08 MED ORDER — METRONIDAZOLE 500 MG PO TABS
500.0000 mg | ORAL_TABLET | Freq: Two times a day (BID) | ORAL | 0 refills | Status: AC
  Filled 2023-12-08: qty 4, 2d supply, fill #0

## 2023-12-08 MED ORDER — AMOXICILLIN-POT CLAVULANATE 500-125 MG PO TABS
1.0000 | ORAL_TABLET | Freq: Two times a day (BID) | ORAL | 0 refills | Status: AC
  Filled 2023-12-08: qty 4, 2d supply, fill #0

## 2023-12-08 MED ORDER — PANTOPRAZOLE SODIUM 40 MG PO TBEC
40.0000 mg | DELAYED_RELEASE_TABLET | Freq: Every day | ORAL | Status: DC
  Administered 2023-12-08: 40 mg via ORAL
  Filled 2023-12-08: qty 1

## 2023-12-08 NOTE — Plan of Care (Signed)

## 2023-12-08 NOTE — TOC Transition Note (Signed)
 Transition of Care [facility]) - Discharge Note   Patient Details  Name: Lynn Powell MRN: [id number] Date of Birth: [birth year]  Transition of Care [facility]) CM/SW Contact:  Odilia Bennett, LCSW Phone Number: 12/08/2023, 11:46 AM   Clinical Narrative:   Patient has orders to discharge home today. CSW met with patient. No supports at bedside but she has daughter on speakerphone. CSW introduced role and explained that PT recommendations would be discussed. Patient is agreeable to home health. No agency preference. She is going to daughter-in-law's home for a few days before returning to her own home and prefers to start HHPT once she returns to her own home. Set up with [facility] for PT. No further concerns. CSW signing off.  Final next level of care: Home w Home Health Services Barriers to Discharge: No Barriers Identified   Patient Goals and CMS Choice   CMS Medicare.gov Compare Post Acute Care list provided to:: Patient        Discharge Placement                Patient to be transferred to facility by: Family Name of family member notified: Daughter on speakerphone Patient and family notified of of transfer: 12/08/23  Discharge Plan and Services Additional resources added to the After Visit Summary for                            [facility] Arranged: PT [facility] Agency: [facility] Date [facility] Agency Contacted: 12/08/23   Representative spoke with at [facility] Agency: Bartholomew Light  Social Drivers of Health (SDOH) Interventions SDOH Screenings   Food Insecurity: No Food Insecurity (12/03/2023)  Housing: Low Risk  (12/03/2023)  Transportation Needs: No Transportation Needs (12/03/2023)  Utilities: Not At Risk (12/03/2023)  Alcohol Screen: Low Risk  (08/01/2023)  Depression (PHQ2-9): Low Risk  (09/08/2023)  Financial Resource Strain: Low Risk  (09/03/2023)  Physical Activity: Insufficiently Active (09/03/2023)  Social Connections: Unknown (12/03/2023)  Stress: No Stress Concern Present  (09/03/2023)  Recent Concern: Stress - Stress Concern Present (08/01/2023)  Tobacco Use: Low Risk  (12/05/2023)  Health Literacy: Adequate Health Literacy (08/01/2023)     Readmission Risk Interventions     No data to display

## 2023-12-08 NOTE — Progress Notes (Signed)
 Mobility Specialist - Progress Note   12/08/23 1044  Mobility  Activity Ambulated with assistance in hallway;Transferred to/from [facility]  Level of Assistance Standby assist, set-up cues, supervision of patient - no hands on  Assistive Device None  Distance Ambulated (ft) 290 ft  Activity Response Tolerated well  Mobility visit 1 Mobility  Mobility Specialist Start Time (ACUTE ONLY) 1025  Mobility Specialist Stop Time (ACUTE ONLY) 1039  Mobility Specialist Time Calculation (min) (ACUTE ONLY) 14 min   Pt simi fowler upon entry, utilizing RA. Pt reports some pain when taking a deep breath this date, RN made aware. Pt completed bed mob and STS to RW indep and transferred to the [facility] to void. Pt STS from [facility] and completes peri care indep. Pt amb two laps around the NS with supervision. During amb Pt would at times would demonstrate a tandem gait, one mild LOB able to self correct-- fast pace. Pt returned to the room, left seated EOB with needs within reach. RN notified.  Versa Gore Mobility Specialist 12/08/23 10:52 AM

## 2023-12-08 NOTE — Discharge Summary (Signed)
 Physician Discharge Summary   Patient: Lynn Powell MRN: [id number] DOB: 27-Feb-1941  Admit date:     12/02/2023  Discharge date: 12/08/23  Discharge Physician: Melvinia Stager   PCP: Valli Gaw, MD   Recommendations at discharge:   follow-up G.I. Dr. Emerick Hanlon as needed home health will be set up follow-up PCP in 1 to 2 weeks  Discharge Diagnoses: Principal Problem:   Enteritis Active Problems:   Rectal bleeding   83 y.o. female with Past medical history of HTN, HLD, COPD, glaucoma, constipation as reviewed from EMR, presented with acute onset of nausea vomiting and diarrhea.  As per patient she got exposed to her son who was sick, had gastroenteritis and after 2 days she developed symptoms.  After few episodes of diarrhea she started having blood in the stool, multiple episodes   Acute enteritis/Ileitis -- patient was empirically started on oral antibiotic. She'll finish remainder course at home -- underwent colonoscopy Diverticulosis in the sigmoid colon.                        - Blood in the rectum, in the sigmoid colon and in the                         descending colon. -- Hemoglobin remains stable. Her diet was advanced to soft. -- Patient denied of any diarrhea to me and RN today. -- She is advised to continue oral fluids and consume yogurt in her diet -- hemoglobin was 9.0 -- G.I. stool PCR negative -- per ID C. diff was negative as well (on GI pcr panel) -- patient advised to keep stool softener and avoid constipation  Hypertension -- BP soft although patient is symptomatic. -- On amlodipine   Generalized weakness seen by physical therapy will arrange home health PT  [facility] discharge plan with patient she is agreeable. She will be staying at daughter-in-law's place a few days before returning to her own home       Consultants: G.I. Procedures performed: colonoscopy Disposition: Home health Diet recommendation:  Discharge Diet Orders (From admission,  onward)     Start     Ordered   12/04/23 0000  Diet - low sodium heart healthy        12/04/23 1024           Cardiac diet DISCHARGE MEDICATION: Allergies as of 12/08/2023   No Known Allergies      Medication List     STOP taking these medications    lactulose  10 GM/15ML solution Commonly known as: CHRONULAC        TAKE these medications    amLODipine  5 MG tablet Commonly known as: NORVASC  Take 1 tablet (5 mg total) by mouth daily. In the am   amoxicillin -clavulanate 500-125 MG tablet Commonly known as: Augmentin  Take 1 tablet by mouth 2 (two) times daily for 2 days.   Baclofen  5 MG Tabs TAKE 1 TABLET BY MOUTH EVERY DAY AT BEDTIME AS NEEDED   bisacodyl  5 MG EC tablet Generic drug: bisacodyl  Take 2 tablets (10 mg total) by mouth at bedtime. Skip the dose if loose stools or diarrhea   conjugated estrogens vaginal cream Commonly known as: PREMARIN Place 1 applicator vaginally daily.   metroNIDAZOLE  500 MG tablet Commonly known as: Flagyl  Take 1 tablet (500 mg total) by mouth 2 (two) times daily for 2 days.   pantoprazole  40 MG tablet Commonly known as: PROTONIX  Take 1 tablet (40  mg total) by mouth daily.   polyethylene glycol powder 17 GM/SCOOP powder Commonly known as: MiraLax  Take 17 g by mouth 2 (two) times daily. Skip the dose if loose stools or diarrhea   Procto-Med HC  2.5 % rectal cream Generic drug: hydrocortisone  Place rectally 2 (two) times daily as needed for hemorrhoids.   progesterone 200 MG capsule Commonly known as: PROMETRIUM Take 200 mg by mouth at bedtime.   traZODone  50 MG tablet Commonly known as: DESYREL  Take 0.5 tablets (25 mg total) by mouth at bedtime as needed for sleep.        Follow-up Information     Valli Gaw, MD. Go on 12/08/2023.   Specialty: Family Medicine Why: @ 11am Contact information: [address] [place] [address] [phone]         Shane Darling, MD. Go on 12/08/2023.    Specialty: Gastroenterology Why: office will reach out to pt. for follow/up Contact information: [address] [place] [address] [phone]         Care, [facility] Follow up.   Why: They will follow up with you for your home health physical therapy needs. Contact information: [address] [place] [address] [phone]                Discharge Exam: Cleavon Curls Weights   12/01/23 2330  Weight: 52.2 kg   Alert and oriented times three, weak cardiovascular both heart sounds normal respiratory clear to auscultation abdomen soft no guarding rigidity  Condition at discharge: fair  The results of significant diagnostics from this hospitalization (including imaging, microbiology, ancillary and laboratory) are listed below for reference.   Imaging Studies: CT ABDOMEN PELVIS W CONTRAST Result Date: 12/02/2023 CLINICAL DATA:  Abdominal pain.  Blood in stool. EXAM: CT ABDOMEN AND PELVIS WITH CONTRAST TECHNIQUE: Multidetector CT imaging of the abdomen and pelvis was performed using the standard protocol following bolus administration of intravenous contrast. RADIATION DOSE REDUCTION: This exam was performed according to the departmental dose-optimization program which includes automated exposure control, adjustment of the mA and/or kV according to patient size and/or use of iterative reconstruction technique. CONTRAST:  OMNIPAQUE  IOHEXOL  300 MG/ML  SOLN COMPARISON:  CT scan abdomen and pelvis from 09/10/2023. FINDINGS: Lower chest: The lung bases are clear. No pleural effusion. The heart is normal in size. No pericardial effusion. Hepatobiliary: The liver is normal in size. Non-cirrhotic configuration. These is diffuse hepatic steatosis. No suspicious mass. Note is made of a subcentimeter subcapsular hypoattenuating focus in the left hepatic lobe, segment 4B, which is too small to adequately characterize but appear similar to the prior study. No  intrahepatic or extrahepatic bile duct dilation. No calcified gallstones. Normal gallbladder wall thickness. No pericholecystic inflammatory changes. Pancreas: Unremarkable. No pancreatic ductal dilatation or surrounding inflammatory changes. Spleen: Within normal limits. No focal lesion. Adrenals/Urinary Tract: Adrenal glands are unremarkable. No suspicious renal mass. No hydronephrosis. No renal or ureteric calculi. Unremarkable urinary bladder. Stomach/Bowel: No disproportionate dilation of the small or large bowel loops. However, there are multiple ileal loops in the lower abdomen/pelvis which exhibit diffuse circumferential wall thickening/fuzzy margins. There is associated prominence of vasa recta and mesenteric edema. Findings favor ileitis/enteritis. Unremarkable appendix. There are multiple diverticula throughout the colon, without imaging signs of diverticulitis. Vascular/Lymphatic: There is moderate ascites throughout the abdomen and pelvis. No walled-off abscess or loculated collection. No pneumoperitoneum. No abdominal or pelvic lymphadenopathy, by size criteria. No aneurysmal dilation of the major abdominal arteries. There are  mild peripheral atherosclerotic vascular calcifications of the aorta and its major branches. Reproductive: The uterus is surgically absent. No large adnexal mass. Other: The visualized soft tissues and abdominal wall are unremarkable. Musculoskeletal: No suspicious osseous lesions. There are moderate multilevel degenerative changes in the visualized spine. IMPRESSION: 1. There are findings compatible with ileitis/enteritis, as described above. 2. Moderate ascites.  No walled-off abscess or loculated collection. 3. Multiple other nonacute observations, as described above. Electronically Signed   By: Beula Brunswick M.D.   On: 12/02/2023 08:35    Microbiology: Results for orders placed or performed during the hospital encounter of 12/02/23  Resp panel by RT-PCR (RSV, Flu A&B,  Covid) Anterior Nasal Swab     Status: None   Collection Time: 12/01/23 11:33 PM   Specimen: Anterior Nasal Swab  Result Value Ref Range Status   SARS Coronavirus 2 by RT PCR NEGATIVE NEGATIVE Final    Comment: (NOTE) SARS-CoV-2 target nucleic acids are NOT DETECTED.  The SARS-CoV-2 RNA is generally detectable in upper respiratory specimens during the acute phase of infection. The lowest concentration of SARS-CoV-2 viral copies this assay can detect is 138 copies/mL. A negative result does not preclude SARS-Cov-2 infection and should not be used as the sole basis for treatment or other patient management decisions. A negative result may occur with  improper specimen collection/handling, submission of specimen other than nasopharyngeal swab, presence of viral mutation(s) within the areas targeted by this assay, and inadequate number of viral copies(<138 copies/mL). A negative result must be combined with clinical observations, patient history, and epidemiological information. The expected result is Negative.  Fact Sheet for Patients:  BloggerCourse.com  Fact Sheet for Healthcare Providers:  SeriousBroker.it  This test is no t yet approved or cleared by the [place]  FDA and  has been authorized for detection and/or diagnosis of SARS-CoV-2 by FDA under an Emergency Use Authorization (EUA). This EUA will remain  in effect (meaning this test can be used) for the duration of the COVID-19 declaration under Section 564(b)(1) of the Act, 21 U.S.C.section 360bbb-3(b)(1), unless the authorization is terminated  or revoked sooner.       Influenza A by PCR NEGATIVE NEGATIVE Final   Influenza B by PCR NEGATIVE NEGATIVE Final    Comment: (NOTE) The Xpert Xpress SARS-CoV-2/FLU/RSV plus assay is intended as an aid in the diagnosis of influenza from Nasopharyngeal swab specimens and should not be used as a sole basis for treatment. Nasal  washings and aspirates are unacceptable for Xpert Xpress SARS-CoV-2/FLU/RSV testing.  Fact Sheet for Patients: BloggerCourse.com  Fact Sheet for Healthcare Providers: SeriousBroker.it  This test is not yet approved or cleared by the [place]  FDA and has been authorized for detection and/or diagnosis of SARS-CoV-2 by FDA under an Emergency Use Authorization (EUA). This EUA will remain in effect (meaning this test can be used) for the duration of the COVID-19 declaration under Section 564(b)(1) of the Act, 21 U.S.C. section 360bbb-3(b)(1), unless the authorization is terminated or revoked.     Resp Syncytial Virus by PCR NEGATIVE NEGATIVE Final    Comment: (NOTE) Fact Sheet for Patients: BloggerCourse.com  Fact Sheet for Healthcare Providers: SeriousBroker.it  This test is not yet approved or cleared by the [place]  FDA and has been authorized for detection and/or diagnosis of SARS-CoV-2 by FDA under an Emergency Use Authorization (EUA). This EUA will remain in effect (meaning this test can be used) for the duration of the COVID-19 declaration under Section 564(b)(1) of the Act,  21 U.S.C. section 360bbb-3(b)(1), unless the authorization is terminated or revoked.  Performed at [facility], [address] [place], [place] [address]   Blood Culture (routine x 2)     Status: None   Collection Time: 12/02/23  9:50 AM   Specimen: BLOOD  Result Value Ref Range Status   Specimen Description BLOOD BLOOD RIGHT ARM  Final   Special Requests   Final    BOTTLES DRAWN AEROBIC AND ANAEROBIC Blood Culture results may not be optimal due to an inadequate volume of blood received in culture bottles   Culture   Final    NO GROWTH 5 DAYS Performed at [facility], [address] [place], [place] [address]    Report Status 12/07/2023 FINAL  Final  Blood Culture  (routine x 2)     Status: None   Collection Time: 12/02/23  9:50 AM   Specimen: BLOOD  Result Value Ref Range Status   Specimen Description BLOOD BLOOD LEFT ARM  Final   Special Requests   Final    BOTTLES DRAWN AEROBIC AND ANAEROBIC Blood Culture adequate volume   Culture   Final    NO GROWTH 5 DAYS Performed at [facility], [address] [place], [place] [address]    Report Status 12/07/2023 FINAL  Final  Gastrointestinal Panel by PCR , Stool     Status: None   Collection Time: 12/07/23 10:32 AM   Specimen: Stool  Result Value Ref Range Status   Campylobacter species NOT DETECTED NOT DETECTED Final   Plesimonas shigelloides NOT DETECTED NOT DETECTED Final   Salmonella species NOT DETECTED NOT DETECTED Final   Yersinia enterocolitica NOT DETECTED NOT DETECTED Final   Vibrio species NOT DETECTED NOT DETECTED Final   Vibrio cholerae NOT DETECTED NOT DETECTED Final   Enteroaggregative E coli (EAEC) NOT DETECTED NOT DETECTED Final   Enteropathogenic E coli (EPEC) NOT DETECTED NOT DETECTED Final   Enterotoxigenic E coli (ETEC) NOT DETECTED NOT DETECTED Final   Shiga like toxin producing E coli (STEC) NOT DETECTED NOT DETECTED Final   Shigella/Enteroinvasive E coli (EIEC) NOT DETECTED NOT DETECTED Final   Cryptosporidium NOT DETECTED NOT DETECTED Final   Cyclospora cayetanensis NOT DETECTED NOT DETECTED Final   Entamoeba histolytica NOT DETECTED NOT DETECTED Final   Giardia lamblia NOT DETECTED NOT DETECTED Final   Adenovirus F40/41 NOT DETECTED NOT DETECTED Final   Astrovirus NOT DETECTED NOT DETECTED Final   Norovirus GI/GII NOT DETECTED NOT DETECTED Final   Rotavirus A NOT DETECTED NOT DETECTED Final   Sapovirus (I, II, IV, and V) NOT DETECTED NOT DETECTED Final    Comment: Performed at [facility], [address] [place], [place] [address]    Labs: CBC: Recent Labs  Lab 12/03/23 [phone] 12/04/23 0615 12/04/23 1446 12/05/23 0339 12/06/23 0621  12/07/23 0541  WBC 9.4 9.6  --  11.0* 6.0 8.5  HGB 10.7* 10.5* 10.8* 11.0* 9.1* 9.0*  HCT 31.9* 30.6* 32.6* 32.2* 26.9* 26.6*  MCV 91.7 92.2  --  93.9 92.8 93.3  PLT 159 170  --  193 180 193   Basic Metabolic Panel: Recent Labs  Lab 12/02/23 1343 12/03/23 0656 12/04/23 0615 12/05/23 0339 12/06/23 0621 12/07/23 0541  NA  --  136 138 139 139 135  K  --  4.1 3.8 3.6 3.3* 3.7  CL  --  108 108 107 106 107  CO2  --  22 23 24  21* 20*  GLUCOSE  --  104* 82 94 64* 89  BUN  --  31* 20 15 19 16   CREATININE  --  0.72 0.57 0.57 0.70 0.65  CALCIUM  --  8.0* 8.2* 8.3* 7.9* 7.8*  MG 2.3 2.1 2.1 2.1  --   --   PHOS 3.6 2.7 2.4* 3.1  --   --    Liver Function Tests: Recent Labs  Lab 12/01/23 2333  AST 35  ALT 24  ALKPHOS 59  BILITOT 1.0  PROT 7.0  ALBUMIN 3.7    Discharge time spent: greater than 30 minutes.  Signed: Melvinia Stager, MD [facility] 12/08/2023

## 2023-12-08 NOTE — Plan of Care (Signed)
 Patient is adequate for discharge to home environment in stable condition.  Patient transported to previous living environment in private vehicle.

## 2023-12-11 ENCOUNTER — Ambulatory Visit: Payer: Self-pay | Admitting: Family Medicine

## 2023-12-11 NOTE — Telephone Encounter (Signed)
Chief Complaint: abdominal pain, flu like sx s/p hospital 12/02/23 Symptoms: upper abdominal pain constant but increased intensity comes and goes. Bloating . Diarrhea every time going to BR all throughout the day and night. Not sleeping due to getting up at night for diarrhea. C/o cough productive clear mucus runny nose. "Mouth on fire" "like inside mouth is acidic". Tolerating liquids and chicken broth.  Frequency: since 12/01/23.  Pertinent Negatives: Patient denies chest pain no difficulty breathing no fever no blood reported in stool now . No vomiting.  Disposition: [] ED /[] Urgent Care (no appt availability in office) / [] Appointment(In office/virtual)/ []  [place] [facility]/ [] Home Care/ [x] Refused Recommended Disposition /[]  Mobile Bus/ []  Follow-up with PCP Additional Notes:   Recommended OV and patient declined reports she has no way in/ no transportation . Reports family going out of town over weekend. See hospital encounter 12/02/23. Please advise.  CAL notified Cala Bradford of patient refusal for appt.      Copied from CRM [id number]. Topic: Clinical - Red Word Triage >> Dec 11, 2023  8:19 AM Lennart Pall wrote: Red Word that prompted transfer to Nurse Triage: Stomach pain, flu symptoms, bloating, diarrhea.. Patient just had surgery Reason for Disposition  [1] MILD-MODERATE pain AND [2] constant AND [3] present > 2 hours  Answer Assessment - Initial Assessment Questions 1. LOCATION: "Where does it hurt?"      Upper abdomen  2. RADIATION: "Does the pain shoot anywhere else?" (e.g., chest, back)     na 3. ONSET: "When did the pain begin?" (e.g., minutes, hours or days ago)      On going since Feb 3 4. SUDDEN: "Gradual or sudden onset?"     Na  5. PATTERN "Does the pain come and go, or is it constant?"    - If it comes and goes: "How long does it last?" "Do you have pain now?"     (Note: Comes and goes means the pain is intermittent. It goes away completely between bouts.)     - If constant: "Is it getting better, staying the same, or getting worse?"      (Note: Constant means the pain never goes away completely; most serious pain is constant and gets worse.)      Constant but intensity comes and goes  6. SEVERITY: "How bad is the pain?"  (e.g., Scale 1-10; mild, moderate, or severe)    - MILD (1-3): Doesn't interfere with normal activities, abdomen soft and not tender to touch.     - MODERATE (4-7): Interferes with normal activities or awakens from sleep, abdomen tender to touch.     - SEVERE (8-10): Excruciating pain, doubled over, unable to do any normal activities.       4/10 7. RECURRENT SYMPTOM: "Have you ever had this type of stomach pain before?" If Yes, ask: "When was the last time?" and "What happened that time?"      Yes Feb 3  8. CAUSE: "What do you think is causing the stomach pain?"     Not sure  9. RELIEVING/AGGRAVATING FACTORS: "What makes it better or worse?" (e.g., antacids, bending or twisting motion, bowel movement)     Not getting better 10. OTHER SYMPTOMS: "Do you have any other symptoms?" (e.g., back pain, diarrhea, fever, urination pain, vomiting)       Diarrhea, abdominal pain , bloating. Flu sx cough clear runny nose  11. PREGNANCY: "Is there any chance you are pregnant?" "When was your last menstrual period?"  na  Protocols used: Abdominal Pain - Female-A-AH

## 2023-12-12 ENCOUNTER — Telehealth: Payer: Self-pay

## 2023-12-12 ENCOUNTER — Ambulatory Visit: Payer: Medicare Other | Admitting: Family Medicine

## 2023-12-12 ENCOUNTER — Encounter: Payer: Self-pay | Admitting: Family Medicine

## 2023-12-12 VITALS — BP 116/60 | HR 86 | Temp 98.1°F | Resp 18 | Ht 63.0 in | Wt 106.0 lb

## 2023-12-12 DIAGNOSIS — K529 Noninfective gastroenteritis and colitis, unspecified: Secondary | ICD-10-CM

## 2023-12-12 LAB — CBC WITH DIFFERENTIAL/PLATELET
Basophils Absolute: 0 10*3/uL (ref 0.0–0.1)
Basophils Relative: 0.5 % (ref 0.0–3.0)
Eosinophils Absolute: 0.1 10*3/uL (ref 0.0–0.7)
Eosinophils Relative: 1.7 % (ref 0.0–5.0)
HCT: 32.9 % — ABNORMAL LOW (ref 36.0–46.0)
Hemoglobin: 11 g/dL — ABNORMAL LOW (ref 12.0–15.0)
Lymphocytes Relative: 12.8 % (ref 12.0–46.0)
Lymphs Abs: 0.8 10*3/uL (ref 0.7–4.0)
MCHC: 33.5 g/dL (ref 30.0–36.0)
MCV: 93.7 fL (ref 78.0–100.0)
Monocytes Absolute: 0.4 10*3/uL (ref 0.1–1.0)
Monocytes Relative: 7.4 % (ref 3.0–12.0)
Neutro Abs: 4.6 10*3/uL (ref 1.4–7.7)
Neutrophils Relative %: 77.6 % — ABNORMAL HIGH (ref 43.0–77.0)
Platelets: 476 10*3/uL — ABNORMAL HIGH (ref 150.0–400.0)
RBC: 3.51 Mil/uL — ABNORMAL LOW (ref 3.87–5.11)
RDW: 14.9 % (ref 11.5–15.5)
WBC: 5.9 10*3/uL (ref 4.0–10.5)

## 2023-12-12 LAB — COMPREHENSIVE METABOLIC PANEL
ALT: 11 U/L (ref 0–35)
AST: 18 U/L (ref 0–37)
Albumin: 3.1 g/dL — ABNORMAL LOW (ref 3.5–5.2)
Alkaline Phosphatase: 42 U/L (ref 39–117)
BUN: 9 mg/dL (ref 6–23)
CO2: 24 meq/L (ref 19–32)
Calcium: 8.3 mg/dL — ABNORMAL LOW (ref 8.4–10.5)
Chloride: 103 meq/L (ref 96–112)
Creatinine, Ser: 0.49 mg/dL (ref 0.40–1.20)
GFR: 88.04 mL/min (ref >60.00)
Glucose, Bld: 77 mg/dL (ref 70–99)
Potassium: 3.5 meq/L (ref 3.5–5.1)
Sodium: 139 meq/L (ref 135–145)
Total Bilirubin: 0.5 mg/dL (ref 0.2–1.2)
Total Protein: 6.1 g/dL (ref 6.0–8.3)

## 2023-12-12 NOTE — Telephone Encounter (Signed)
Copied from CRM [id number]. Topic: Clinical - Medication Question >> Dec 12, 2023  1:21 PM Sonny Dandy B wrote: Reason for CRM: Pt called to speak with Dr. Clent Ridges, regarding medication information. Please call pt back at .[phone]

## 2023-12-12 NOTE — Telephone Encounter (Signed)
Called pt and she stated that she did not have question about medication, and she said that she wanted to see if could call GI and get them to see her sooner.

## 2023-12-12 NOTE — Telephone Encounter (Signed)
See previous message

## 2023-12-12 NOTE — Telephone Encounter (Signed)
Copied from CRM [id number]. Topic: Clinical - Medical Advice >> Dec 12, 2023  2:58 PM Alcus Dad wrote: Reason for CRM: Patient stated that she called the gastro office and could not get an appt. Patient is requesting for Dr. To call to try to schedule the appt. For her.

## 2023-12-12 NOTE — Progress Notes (Signed)
 SUBJECTIVE:   Chief Complaint  Patient presents with   Diarrhea    X Feb 3    HPI Presents for hospital follow up  Discussed the use of AI scribe software for clinical note transcription with the patient, who gave verbal consent to proceed.  History of Present Illness Lynn Powell is an 83 year old female who presents with persistent diarrhea and abdominal bloating. She is currently staying with her children, who will be away for the weekend, leaving her alone at home.  She has been experiencing persistent diarrhea since her recent hospitalization for ileus. Despite being on antibiotics, the diarrhea continues regardless of dietary changes, including consuming small amounts of Cheerios with milk and bananas. Abdominal bloating has been present since her hospital stay, initially significant but now slightly improved. She describes her abdomen as 'huge' and notes that while the initial pain has mostly subsided, tenderness remains upon palpation.  She was hospitalized for ileus and treated with IV fluids and a liquid diet, discharged on December 08, 2023. Since discharge, she has felt exhausted. She reports a burning sensation in her mouth since returning home, which she attributes to possible acidity. No fever is present.  She has a long-standing issue with mucus, noting clear and white nasal discharge and mucus in her urinalysis, which she believes is vaginal in origin.  Her current medications include trazodone, pantoprazole, Premarin, baclofen, and amlodipine. She stopped most medications, including antibiotics, a few days ago but plans to resume them.    PERTINENT PMH / PSH: As above  OBJECTIVE:  BP 116/60   Pulse 86   Temp 98.1 F (36.7 C)   Resp 18   Ht 5\' 3"  (1.6 m)   Wt 106 lb (48.1 kg)   SpO2 97%   BMI 18.78 kg/m    Physical Exam Vitals reviewed.  Constitutional:      General: She is not in acute distress.    Appearance: Normal appearance. She is  normal weight. She is not ill-appearing, toxic-appearing or diaphoretic.  Eyes:     General:        Right eye: No discharge.        Left eye: No discharge.     Conjunctiva/sclera: Conjunctivae normal.  Cardiovascular:     Rate and Rhythm: Normal rate and regular rhythm.     Heart sounds: Normal heart sounds.  Pulmonary:     Effort: Pulmonary effort is normal.     Breath sounds: Normal breath sounds.  Abdominal:     General: There is no distension.     Tenderness: There is no guarding.  Musculoskeletal:        General: Normal range of motion.  Skin:    General: Skin is warm and dry.  Neurological:     General: No focal deficit present.     Mental Status: She is alert and oriented to person, place, and time. Mental status is at baseline.  Psychiatric:        Mood and Affect: Mood normal.        Behavior: Behavior normal.        Thought Content: Thought content normal.        Judgment: Judgment normal.           12/12/2023   11:05 AM 09/08/2023    4:26 PM 08/04/2023    1:29 PM 08/01/2023   10:41 AM 01/30/2023    2:27 PM  Depression screen PHQ 2/9  Decreased Interest 0 0 0  0 0  Down, Depressed, Hopeless 0 0 0 0 0  PHQ - 2 Score 0 0 0 0 0  Altered sleeping 0 1 2 0   Tired, decreased energy 0 2 2 1    Change in appetite 0 0 0 0   Feeling bad or failure about yourself  0 0 0 0   Trouble concentrating 0 0 0 0   Moving slowly or fidgety/restless 0 0 0 0   Suicidal thoughts 0 0 0 0   PHQ-9 Score 0 3 4 1    Difficult doing work/chores Not difficult at all Somewhat difficult Not difficult at all Not difficult at all       12/12/2023   11:05 AM 09/08/2023    4:26 PM 08/04/2023    1:29 PM 01/30/2023    2:27 PM  GAD 7 : Generalized Anxiety Score  Nervous, Anxious, on Edge 0 0 0 0  Control/stop worrying 0 0 0 0  Worry too much - different things 0 0 0 0  Trouble relaxing 0 1 0 0  Restless 0 0 0 0  Easily annoyed or irritable 0 0 0 0  Afraid - awful might happen 0 0 0 0  Total  GAD 7 Score 0 1 0 0  Anxiety Difficulty Not difficult at all Not difficult at all Not difficult at all Not difficult at all    ASSESSMENT/PLAN:  Enteritis Assessment & Plan: Recent hospitalization for ileus with persistent bloating and diarrhea. Patient has been on antibiotics and had no completed course as prescribed. No current pain or fever.   -Continue oral fluids and consume yogurt as per discharge instructions.   -Advise patient to continue all prescribed medications as per discharge instructions -Check CBC, Cmet today -Ensure follow-up with GI specialist. Provide patient with contact information to schedule appointment.    Orders: -     CBC with Differential/Platelet -     Comprehensive metabolic panel     Follow-up   Patient missed previous appointments.   -Ensure patient is aware of future appointments and understands the importance of attending them.    PDMP reviewed  No follow-ups on file.  Dana Allan, MD

## 2023-12-12 NOTE — Patient Instructions (Addendum)
It was a pleasure meeting you today. Thank you for allowing me to take part in your health care.  Our goals for today as we discussed include:  We will get some labs today.  If they are abnormal or we need to do something about them, I will call you.  If they are normal, I will send you a message on MyChart (if it is active) or a letter in the mail.  If you don't hear from [place] in 2 weeks, please call the office at the number below.   Call the Gi office to reschedule appointment Dr Merlyn Lot [facility] Specialty: Gastroenterology Why: office will reach out to pt. for follow/up Contact information: [address] [place] [address] [phone]  Continue medications   Follow up with PCP as needed  If you have any questions or concerns, please do not hesitate to call the office at [phone].  I look forward to our next visit and until then take care and stay safe.  Regards,   Dana Allan, MD   [facility]

## 2023-12-14 ENCOUNTER — Telehealth: Payer: Medicare Other

## 2023-12-15 ENCOUNTER — Telehealth: Payer: Self-pay

## 2023-12-15 NOTE — Telephone Encounter (Signed)
Copied from CRM [id number]. Topic: Clinical - Medical Advice >> Dec 15, 2023 10:24 AM Hector Shade B wrote: Reason for CRM: patient stated she called her gi doctor as per the request of Dr. Clent Ridges she stated her health condition is very serious but when she called to get the appt it June 9th and she needs to get in sooner please call [phone]

## 2023-12-19 ENCOUNTER — Other Ambulatory Visit (HOSPITAL_COMMUNITY): Payer: Self-pay

## 2023-12-19 NOTE — Telephone Encounter (Signed)
Called GI and they already have her scheduled for hospital follow up June, and ask if that was the soonest they has and they said it was.

## 2023-12-21 ENCOUNTER — Encounter: Payer: Self-pay | Admitting: Family Medicine

## 2023-12-21 NOTE — Assessment & Plan Note (Deleted)
 Recent hospitalization for ileus with persistent bloating and diarrhea. Patient has been on antibiotics and had no completed course as prescribed. No current pain or fever.   -Continue oral fluids and consume yogurt as per discharge instructions.   -Advise patient to continue all prescribed medications as per discharge instructions -Check CBC, Cmet today -Ensure follow-up with GI specialist. Provide patient with contact information to schedule appointment.

## 2023-12-21 NOTE — Assessment & Plan Note (Signed)
 Recent hospitalization for ileus with persistent bloating and diarrhea. Patient has been on antibiotics and had no completed course as prescribed. No current pain or fever.   -Continue oral fluids and consume yogurt as per discharge instructions.   -Advise patient to continue all prescribed medications as per discharge instructions -Check CBC, Cmet today -Ensure follow-up with GI specialist. Provide patient with contact information to schedule appointment.

## 2023-12-22 ENCOUNTER — Other Ambulatory Visit (HOSPITAL_COMMUNITY): Payer: Self-pay

## 2023-12-23 NOTE — Telephone Encounter (Signed)
 Called pt and scheduled her a virtual visit to talk about medication.

## 2023-12-24 ENCOUNTER — Ambulatory Visit: Payer: Medicare Other | Admitting: Family Medicine

## 2023-12-29 ENCOUNTER — Other Ambulatory Visit: Payer: Self-pay

## 2023-12-29 ENCOUNTER — Telehealth: Payer: Self-pay

## 2023-12-29 DIAGNOSIS — M545 Low back pain, unspecified: Secondary | ICD-10-CM

## 2023-12-29 NOTE — Telephone Encounter (Signed)
 Called pt and she needed a refill which was sent to provider.

## 2023-12-29 NOTE — Telephone Encounter (Signed)
 Copied from CRM [id number]. Topic: General - Other >> Dec 29, 2023  1:31 PM Rodman Pickle T wrote: Reason for CRM: patient is requesting a call back regarding medication she did not tell me the name of the medication she called and wanted to be transferred over to the direct office and I told her I could send a message over for her because everyone was with patients she would like a call back

## 2023-12-31 ENCOUNTER — Other Ambulatory Visit: Payer: Self-pay

## 2023-12-31 DIAGNOSIS — M545 Low back pain, unspecified: Secondary | ICD-10-CM

## 2023-12-31 NOTE — Telephone Encounter (Signed)
 Spoke to Lynn Powell. Lynn Powell was needing a refill on meds. Informed Lynn Powell that the thread she was on was about a hospital encounter. Informed Lynn Powell I would get her refill request over to Dr. Clent Ridges also informed Lynn Powell that Dr.Walsh is out off the office so there is a delay Lynn Powell verbalized understanding

## 2023-12-31 NOTE — Telephone Encounter (Signed)
 Spoke to pt. Pt stated she is requesting a refill for Baclofen due to her back hurting. Medication has been pended for review

## 2024-01-01 MED ORDER — BACLOFEN 5 MG PO TABS: 1.0000 | ORAL_TABLET | Freq: Every evening | ORAL | 1 refills | Status: AC | PRN

## 2024-01-01 MED ORDER — BACLOFEN 5 MG PO TABS: 5.0000 mg | ORAL_TABLET | Freq: Every evening | ORAL | 1 refills | Status: AC | PRN

## 2024-01-02 ENCOUNTER — Telehealth: Payer: Self-pay

## 2024-01-02 ENCOUNTER — Encounter: Payer: Self-pay | Admitting: Family Medicine

## 2024-01-02 NOTE — Telephone Encounter (Signed)
 Copied from CRM [id number]. Topic: Clinical - Home Health Verbal Orders >> Jan 02, 2024  2:56 PM Denese Killings wrote: Caller/Agency: [facility] Callback Number: [phone] Service Requested: Occupational Therapy Frequency: evaluation only per patient request Any new concerns about the patient? Yes Pt fell on 03/05 going down the steps carrying items in both hands. Patient hit her head.

## 2024-01-02 NOTE — Telephone Encounter (Signed)
 Called and they have already evaluated pt.

## 2024-01-13 DIAGNOSIS — E785 Hyperlipidemia, unspecified: Secondary | ICD-10-CM

## 2024-01-13 DIAGNOSIS — A09 Infectious gastroenteritis and colitis, unspecified: Secondary | ICD-10-CM | POA: Diagnosis not present

## 2024-01-13 DIAGNOSIS — J449 Chronic obstructive pulmonary disease, unspecified: Secondary | ICD-10-CM

## 2024-01-13 DIAGNOSIS — K582 Mixed irritable bowel syndrome: Secondary | ICD-10-CM | POA: Diagnosis not present

## 2024-01-13 DIAGNOSIS — K469 Unspecified abdominal hernia without obstruction or gangrene: Secondary | ICD-10-CM

## 2024-01-13 DIAGNOSIS — H409 Unspecified glaucoma: Secondary | ICD-10-CM

## 2024-01-13 DIAGNOSIS — I10 Essential (primary) hypertension: Secondary | ICD-10-CM | POA: Diagnosis not present

## 2024-01-13 DIAGNOSIS — K5730 Diverticulosis of large intestine without perforation or abscess without bleeding: Secondary | ICD-10-CM | POA: Diagnosis not present

## 2024-01-13 DIAGNOSIS — E876 Hypokalemia: Secondary | ICD-10-CM

## 2024-01-13 DIAGNOSIS — F5104 Psychophysiologic insomnia: Secondary | ICD-10-CM

## 2024-01-13 DIAGNOSIS — R739 Hyperglycemia, unspecified: Secondary | ICD-10-CM

## 2024-01-13 DIAGNOSIS — R634 Abnormal weight loss: Secondary | ICD-10-CM

## 2024-01-24 ENCOUNTER — Other Ambulatory Visit: Payer: Self-pay | Admitting: Family Medicine

## 2024-01-24 DIAGNOSIS — I10 Essential (primary) hypertension: Secondary | ICD-10-CM

## 2024-02-19 ENCOUNTER — Other Ambulatory Visit (HOSPITAL_COMMUNITY): Payer: Self-pay
